# Patient Record
Sex: Male | Born: 1955
Health system: Southern US, Community
[De-identification: ages and names within clinical notes are randomized; demographics above are authoritative.]

## PROBLEM LIST (undated history)

## (undated) DIAGNOSIS — Z992 Dependence on renal dialysis: Secondary | ICD-10-CM

## (undated) DIAGNOSIS — Z7901 Long term (current) use of anticoagulants: Secondary | ICD-10-CM

## (undated) DIAGNOSIS — N189 Chronic kidney disease, unspecified: Secondary | ICD-10-CM

## (undated) DIAGNOSIS — E785 Hyperlipidemia, unspecified: Secondary | ICD-10-CM

## (undated) DIAGNOSIS — I10 Essential (primary) hypertension: Secondary | ICD-10-CM

## (undated) DIAGNOSIS — G4730 Sleep apnea, unspecified: Secondary | ICD-10-CM

## (undated) DIAGNOSIS — M109 Gout, unspecified: Secondary | ICD-10-CM

## (undated) DIAGNOSIS — D649 Anemia, unspecified: Secondary | ICD-10-CM

## (undated) HISTORY — PX: COLONOSCOPY: SHX174

---

## 2012-12-07 DIAGNOSIS — I10 Essential (primary) hypertension: Secondary | ICD-10-CM | POA: Diagnosis present

## 2012-12-10 DIAGNOSIS — N185 Chronic kidney disease, stage 5: Secondary | ICD-10-CM | POA: Insufficient documentation

## 2012-12-10 DIAGNOSIS — E1129 Type 2 diabetes mellitus with other diabetic kidney complication: Secondary | ICD-10-CM | POA: Insufficient documentation

## 2012-12-10 DIAGNOSIS — E669 Obesity, unspecified: Secondary | ICD-10-CM | POA: Insufficient documentation

## 2012-12-10 DIAGNOSIS — G4733 Obstructive sleep apnea (adult) (pediatric): Secondary | ICD-10-CM | POA: Insufficient documentation

## 2014-01-17 DIAGNOSIS — E559 Vitamin D deficiency, unspecified: Secondary | ICD-10-CM | POA: Insufficient documentation

## 2015-02-19 DIAGNOSIS — N401 Enlarged prostate with lower urinary tract symptoms: Secondary | ICD-10-CM | POA: Insufficient documentation

## 2015-08-20 DIAGNOSIS — H10409 Unspecified chronic conjunctivitis, unspecified eye: Secondary | ICD-10-CM | POA: Insufficient documentation

## 2016-05-01 DIAGNOSIS — Q8781 Alport syndrome: Secondary | ICD-10-CM | POA: Insufficient documentation

## 2017-08-14 DIAGNOSIS — M10372 Gout due to renal impairment, left ankle and foot: Secondary | ICD-10-CM | POA: Insufficient documentation

## 2017-09-21 DIAGNOSIS — R109 Unspecified abdominal pain: Secondary | ICD-10-CM | POA: Insufficient documentation

## 2019-08-07 ENCOUNTER — Encounter: Payer: Self-pay | Admitting: Internal Medicine

## 2019-08-07 ENCOUNTER — Other Ambulatory Visit: Payer: Self-pay

## 2019-08-07 ENCOUNTER — Inpatient Hospital Stay
Admission: AD | Admit: 2019-08-07 | Discharge: 2019-08-11 | DRG: 673 | Disposition: A | Discharge status: Home / Self Care | Payer: PRIVATE HEALTH INSURANCE | Source: Ambulatory Visit | Attending: Family Medicine | Admitting: Family Medicine

## 2019-08-07 ENCOUNTER — Inpatient Hospital Stay: Payer: PRIVATE HEALTH INSURANCE

## 2019-08-07 DIAGNOSIS — Z992 Dependence on renal dialysis: Secondary | ICD-10-CM

## 2019-08-07 DIAGNOSIS — Z8611 Personal history of tuberculosis: Secondary | ICD-10-CM | POA: Diagnosis not present

## 2019-08-07 DIAGNOSIS — N185 Chronic kidney disease, stage 5: Secondary | ICD-10-CM

## 2019-08-07 DIAGNOSIS — Q8781 Alport syndrome: Secondary | ICD-10-CM

## 2019-08-07 DIAGNOSIS — Z20822 Contact with and (suspected) exposure to covid-19: Secondary | ICD-10-CM | POA: Diagnosis present

## 2019-08-07 DIAGNOSIS — G4733 Obstructive sleep apnea (adult) (pediatric): Secondary | ICD-10-CM | POA: Diagnosis present

## 2019-08-07 DIAGNOSIS — Z8249 Family history of ischemic heart disease and other diseases of the circulatory system: Secondary | ICD-10-CM | POA: Diagnosis not present

## 2019-08-07 DIAGNOSIS — I10 Essential (primary) hypertension: Secondary | ICD-10-CM | POA: Diagnosis not present

## 2019-08-07 DIAGNOSIS — Z79899 Other long term (current) drug therapy: Secondary | ICD-10-CM | POA: Diagnosis not present

## 2019-08-07 DIAGNOSIS — R630 Anorexia: Secondary | ICD-10-CM | POA: Diagnosis present

## 2019-08-07 DIAGNOSIS — I120 Hypertensive chronic kidney disease with stage 5 chronic kidney disease or end stage renal disease: Principal | ICD-10-CM | POA: Diagnosis present

## 2019-08-07 DIAGNOSIS — E785 Hyperlipidemia, unspecified: Secondary | ICD-10-CM | POA: Diagnosis present

## 2019-08-07 DIAGNOSIS — Z6841 Body Mass Index (BMI) 40.0 and over, adult: Secondary | ICD-10-CM | POA: Diagnosis not present

## 2019-08-07 DIAGNOSIS — N186 End stage renal disease: Secondary | ICD-10-CM | POA: Diagnosis present

## 2019-08-07 DIAGNOSIS — Z87891 Personal history of nicotine dependence: Secondary | ICD-10-CM

## 2019-08-07 DIAGNOSIS — E1122 Type 2 diabetes mellitus with diabetic chronic kidney disease: Secondary | ICD-10-CM | POA: Diagnosis present

## 2019-08-07 DIAGNOSIS — Z9981 Dependence on supplemental oxygen: Secondary | ICD-10-CM

## 2019-08-07 DIAGNOSIS — D631 Anemia in chronic kidney disease: Secondary | ICD-10-CM

## 2019-08-07 DIAGNOSIS — R110 Nausea: Secondary | ICD-10-CM | POA: Diagnosis present

## 2019-08-07 DIAGNOSIS — E559 Vitamin D deficiency, unspecified: Secondary | ICD-10-CM | POA: Diagnosis present

## 2019-08-07 DIAGNOSIS — N2581 Secondary hyperparathyroidism of renal origin: Secondary | ICD-10-CM | POA: Diagnosis present

## 2019-08-07 DIAGNOSIS — E118 Type 2 diabetes mellitus with unspecified complications: Secondary | ICD-10-CM | POA: Diagnosis not present

## 2019-08-07 DIAGNOSIS — R0602 Shortness of breath: Secondary | ICD-10-CM

## 2019-08-07 DIAGNOSIS — R809 Proteinuria, unspecified: Secondary | ICD-10-CM | POA: Diagnosis present

## 2019-08-07 HISTORY — DX: Chronic kidney disease, unspecified: N18.9

## 2019-08-07 HISTORY — DX: Sleep apnea, unspecified: G47.30

## 2019-08-07 HISTORY — DX: Hyperlipidemia, unspecified: E78.5

## 2019-08-07 HISTORY — DX: Essential (primary) hypertension: I10

## 2019-08-07 LAB — CBC
HCT: 26.8 % — ABNORMAL LOW (ref 39.0–52.0)
Hemoglobin: 8.9 g/dL — ABNORMAL LOW (ref 13.0–17.0)
MCH: 29 pg (ref 26.0–34.0)
MCHC: 33.2 g/dL (ref 30.0–36.0)
MCV: 87.3 fL (ref 80.0–100.0)
Platelets: 269 10*3/uL (ref 150–400)
RBC: 3.07 MIL/uL — ABNORMAL LOW (ref 4.22–5.81)
RDW: 14.1 % (ref 11.5–15.5)
WBC: 9.7 10*3/uL (ref 4.0–10.5)
nRBC: 0 % (ref 0.0–0.2)

## 2019-08-07 LAB — COMPREHENSIVE METABOLIC PANEL
ALT: 14 U/L (ref 0–44)
AST: 11 U/L — ABNORMAL LOW (ref 15–41)
Albumin: 3.5 g/dL (ref 3.5–5.0)
Alkaline Phosphatase: 73 U/L (ref 38–126)
Anion gap: 13 (ref 5–15)
BUN: 122 mg/dL — ABNORMAL HIGH (ref 8–23)
CO2: 17 mmol/L — ABNORMAL LOW (ref 22–32)
Calcium: 7.5 mg/dL — ABNORMAL LOW (ref 8.9–10.3)
Chloride: 105 mmol/L (ref 98–111)
Creatinine, Ser: 9 mg/dL — ABNORMAL HIGH (ref 0.61–1.24)
GFR calc Af Amer: 7 mL/min — ABNORMAL LOW (ref >60)
GFR calc non Af Amer: 6 mL/min — ABNORMAL LOW (ref >60)
Glucose, Bld: 88 mg/dL (ref 70–99)
Potassium: 3.8 mmol/L (ref 3.5–5.1)
Sodium: 135 mmol/L (ref 135–145)
Total Bilirubin: 0.5 mg/dL (ref 0.3–1.2)
Total Protein: 6.9 g/dL (ref 6.5–8.1)

## 2019-08-07 LAB — SARS CORONAVIRUS 2 (TAT 6-24 HRS): SARS Coronavirus 2: NEGATIVE

## 2019-08-07 LAB — PHOSPHORUS: Phosphorus: 10.3 mg/dL — ABNORMAL HIGH (ref 2.5–4.6)

## 2019-08-07 LAB — MAGNESIUM: Magnesium: 2.5 mg/dL — ABNORMAL HIGH (ref 1.7–2.4)

## 2019-08-07 LAB — HIV ANTIBODY (ROUTINE TESTING W REFLEX): HIV Screen 4th Generation wRfx: NONREACTIVE

## 2019-08-07 MED ORDER — TAMSULOSIN HCL 0.4 MG PO CAPS
0.4000 mg | ORAL_CAPSULE | Freq: Every day | ORAL | Status: DC
  Administered 2019-08-09 – 2019-08-11 (×3): 0.4 mg via ORAL
  Filled 2019-08-07 (×3): qty 1

## 2019-08-07 MED ORDER — TELMISARTAN-HCTZ 80-12.5 MG PO TABS: 1.0000 | ORAL_TABLET | Freq: Every day | ORAL | Status: DC

## 2019-08-07 MED ORDER — VITAMIN B-6 50 MG PO TABS
50.0000 mg | ORAL_TABLET | Freq: Every day | ORAL | Status: DC
  Administered 2019-08-09 – 2019-08-11 (×2): 50 mg via ORAL
  Filled 2019-08-07 (×4): qty 1

## 2019-08-07 MED ORDER — ACETAMINOPHEN 650 MG RE SUPP: 650.0000 mg | Freq: Four times a day (QID) | RECTAL | Status: DC | PRN

## 2019-08-07 MED ORDER — IRBESARTAN 150 MG PO TABS
300.0000 mg | ORAL_TABLET | Freq: Every day | ORAL | Status: DC
  Administered 2019-08-09 – 2019-08-10 (×2): 300 mg via ORAL
  Filled 2019-08-07 (×3): qty 2

## 2019-08-07 MED ORDER — ROSUVASTATIN CALCIUM 10 MG PO TABS
10.0000 mg | ORAL_TABLET | Freq: Every day | ORAL | Status: DC
  Administered 2019-08-09 – 2019-08-11 (×3): 10 mg via ORAL
  Filled 2019-08-07 (×3): qty 1

## 2019-08-07 MED ORDER — ACETAMINOPHEN 325 MG PO TABS
650.0000 mg | ORAL_TABLET | Freq: Four times a day (QID) | ORAL | Status: DC | PRN
  Administered 2019-08-08 – 2019-08-09 (×2): 650 mg via ORAL
  Filled 2019-08-07 (×3): qty 2

## 2019-08-07 MED ORDER — SODIUM CHLORIDE 0.9% FLUSH
3.0000 mL | Freq: Two times a day (BID) | INTRAVENOUS | Status: DC
  Administered 2019-08-07 – 2019-08-11 (×6): 3 mL via INTRAVENOUS

## 2019-08-07 MED ORDER — SODIUM CHLORIDE 0.9 % IV SOLN
250.0000 mL | INTRAVENOUS | Status: DC | PRN
  Administered 2019-08-10: 250 mL via INTRAVENOUS

## 2019-08-07 MED ORDER — ONDANSETRON HCL 4 MG PO TABS: 4.0000 mg | ORAL_TABLET | Freq: Four times a day (QID) | ORAL | Status: DC | PRN

## 2019-08-07 MED ORDER — SODIUM CHLORIDE 0.9% FLUSH: 3.0000 mL | INTRAVENOUS | Status: DC | PRN

## 2019-08-07 MED ORDER — ALLOPURINOL 100 MG PO TABS
100.0000 mg | ORAL_TABLET | Freq: Every day | ORAL | Status: DC
  Administered 2019-08-09 – 2019-08-11 (×3): 100 mg via ORAL
  Filled 2019-08-07 (×3): qty 1

## 2019-08-07 MED ORDER — ONDANSETRON HCL 4 MG/2ML IJ SOLN: 4.0000 mg | Freq: Four times a day (QID) | INTRAMUSCULAR | Status: DC | PRN

## 2019-08-07 MED ORDER — DILTIAZEM HCL ER COATED BEADS 240 MG PO CP24
240.0000 mg | ORAL_CAPSULE | Freq: Every day | ORAL | Status: DC
  Administered 2019-08-09 – 2019-08-10 (×2): 240 mg via ORAL
  Filled 2019-08-07 (×2): qty 1
  Filled 2019-08-07: qty 2
  Filled 2019-08-07 (×2): qty 1
  Filled 2019-08-07: qty 2

## 2019-08-07 MED ORDER — HYDROCHLOROTHIAZIDE 12.5 MG PO CAPS: 12.5000 mg | ORAL_CAPSULE | Freq: Every day | ORAL | Status: DC

## 2019-08-07 NOTE — H&P (Signed)
History and Physical    Peter Pearson OIZ:[id number] DOB: [birth year] DOA: 08/07/2019  PCP: System, Pcp Not In   Patient coming from: Home  I have personally briefly reviewed patient's old medical records in [place]  Chief Complaint: Shortness of breath  HPI: Peter Pearson is a 64 y.o. 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  Previously patient was followed by [facility] nephrology but requested second opinion.  Patient has known severe renal insufficiency.  Most recent BUN is 122 with a creatinine of 9 and EGFR of 6.  Serum bicarbonate level is 17. Serum phosphorus was also quite high at 9.1.  He also has significant proteinuria with most recent albumin to creatinine ratio of 4065.  He also has evidence of anemia chronic kidney disease with most recent hemoglobin of 8.3.  In addition patient has additional complication in the form of secondary hyperparathyroidism with most recent PTH of 776.  He was previously advised to pursue renal placement therapy but wanted to consider this a bit further.   Patient complains of exertional shortness of breath associated with lower extremity swelling, nausea and anorexia.  Denies having any chest pain, dizziness, light headedness, cough, fever or chills He has now agreed to undergo hemodialysis.  Plan is for patient to get PermCath placed in a.m. prior to initiation of dialysis.   ED Course: N/A  Review of Systems: As per HPI otherwise 10 point review of systems negative.    Past Medical History:  Diagnosis Date   . Chronic kidney disease   . Hyperlipidemia   . Hypertension   . Sleep apnea     History reviewed. No pertinent surgical history.   reports that he quit smoking about 30 years ago. He has never used smokeless tobacco. He reports that he does not drink alcohol or use drugs.  No Known Allergies  Family History  Problem Relation Age of Onset  . Heart disease Father      Prior to Admission medications   Medication Sig Start Date End Date Taking? Authorizing Provider  allopurinol (ZYLOPRIM) 100 MG tablet Take 100 mg by mouth daily.   Yes [provider]  diltiazem (DILACOR XR) 240 MG 24 hr capsule Take 240 mg by mouth daily.   Yes [provider]  pyridOXINE (VITAMIN B-6) 50 MG tablet Take 50 mg by mouth daily.   Yes [provider]  rosuvastatin (CRESTOR) 20 MG tablet Take 10 mg by mouth daily.   Yes [provider]  tamsulosin (FLOMAX) 0.4 MG CAPS capsule Take 0.4 mg by mouth daily.   Yes [provider]  telmisartan-hydrochlorothiazide (MICARDIS HCT) 80-12.5 MG tablet Take 1 tablet by mouth daily.   Yes [provider]    Physical Exam: Vitals:   08/07/19 1100 08/07/19 1316  BP:  127/63  Pulse:  67  Temp:  (!) 97.4 F (36.3 C)  TempSrc:  Oral  SpO2:  100%  Weight: 125.2 kg   Height: 5' 8" (1.727 m)      Vitals:   08/07/19 1100 08/07/19 1316  BP:  127/63  Pulse:  67  Temp:  (!) 97.4 F (36.3  C)  TempSrc:  Oral  SpO2:  100%  Weight: 125.2 kg   Height: 5' 8" (1.727 m)     Constitutional: NAD, alert and oriented to person place and time Eyes: PERRL, lids and conjunctivae pale ENMT: Mucous membranes are moist.  Hearing loss Neck: normal, supple, no masses, no thyromegaly Respiratory: Air movement in all lung fields, no wheezing, no crackles. Normal respiratory effort. No accessory muscle use.  Cardiovascular: Regular rate and rhythm, no murmurs / rubs / gallops. No extremity edema. 2+ pedal pulses. No carotid  bruits.  Abdomen: no tenderness, no masses palpated. No hepatosplenomegaly. Bowel sounds positive.  Musculoskeletal: no clubbing / cyanosis. No joint deformity upper and lower extremities.  Skin: no rashes, lesions, ulcers.  Neurologic: No gross focal neurologic deficit. Psychiatric: Normal mood and affect.   Labs on Admission: I have personally reviewed following labs and imaging studies  CBC: Recent Labs  Lab 08/07/19 1240  WBC 9.7  HGB 8.9*  HCT 26.8*  MCV 87.3  PLT 016   Basic Metabolic Panel: No results for input(s): NA, K, CL, CO2, GLUCOSE, BUN, CREATININE, CALCIUM, MG, PHOS in the last 168 hours. GFR: CrCl cannot be calculated (No successful lab value found.). Liver Function Tests: No results for input(s): AST, ALT, ALKPHOS, BILITOT, PROT, ALBUMIN in the last 168 hours. No results for input(s): LIPASE, AMYLASE in the last 168 hours. No results for input(s): AMMONIA in the last 168 hours. Coagulation Profile: No results for input(s): INR, PROTIME in the last 168 hours. Cardiac Enzymes: No results for input(s): CKTOTAL, CKMB, CKMBINDEX, TROPONINI in the last 168 hours. BNP (last 3 results) No results for input(s): PROBNP in the last 8760 hours. HbA1C: No results for input(s): HGBA1C in the last 72 hours. CBG: No results for input(s): GLUCAP in the last 168 hours. Lipid Profile: No results for input(s): CHOL, HDL, LDLCALC, TRIG, CHOLHDL, LDLDIRECT in the last 72 hours. Thyroid Function Tests: No results for input(s): TSH, T4TOTAL, FREET4, T3FREE, THYROIDAB in the last 72 hours. Anemia Panel: No results for input(s): VITAMINB12, FOLATE, FERRITIN, TIBC, IRON, RETICCTPCT in the last 72 hours. Urine analysis: No results found for: COLORURINE, APPEARANCEUR, LABSPEC, PHURINE, GLUCOSEU, HGBUR, BILIRUBINUR, KETONESUR, PROTEINUR, UROBILINOGEN, NITRITE, LEUKOCYTESUR  Radiological Exams on Admission: No results found.  EKG: Independently reviewed. Sinus  rhythm  Assessment/Plan Active Problems:   ESRD (end stage renal disease) (HCC)   Essential hypertension   Anemia of chronic kidney failure, stage 5 (HCC)    Stage V chronic kidney disease Patient with known stage V chronic kidney disease with worsening of his renal function BUN/Cr 122/9  admitted to the hospital for initiation of renal replacement therapy due to uremic symptoms PermCath insertion is planned for a.m. Nephrology consult   Hypertension Blood pressure is controlled on diltiazem, HCTZ and ARB   Anemia of chronic kidney disease H&H is stable Monitor closely    DVT prophylaxis: SCD Code Status: Full Family Communication: Plan of care was discussed with patient in detail.  Verbalizes understanding and agrees with the plan Disposition Plan: Back to previous home environment Consults called: Nephrology    Collier Bullock MD [facility]     08/07/2019, 1:59 PM

## 2019-08-07 NOTE — Consult Note (Signed)
[facility] CONSULT NOTE    Date: 08/07/2019                  Patient Name:  Peter Pearson  MRN: [id number]  DOB: 1956-01-24  Age / Sex: 64 y.o., male         PCP: System, Pcp Not In                 Service Requesting Consult: Hospitalist                 Reason for Consult: Evaluation and management of ESRD in setting of Alports Disease            History of Present Illness: Patient is a 64 y.o. 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  Previously patient was followed by [facility] nephrology but requested second opinion.  Patient has known severe renal insufficiency.  Most recent BUN was 86 with a creatinine of 8.3 and EGFR of 6.  Serum phosphorus was also quite high at 9.1.  He also has significant proteinuria with most recent albumin to creatinine ratio of 4065.  He also has evidence of anemia chronic kidney disease with most recent hemoglobin of 9.3.  In addition patient has additional complication in the form of secondary hyperparathyroidism with most recent PTH of 776.  He was previously advised to pursue renal placement therapy but wanted to consider this a bit further.  He has now agreed to undergo hemodialysis.  Covid test will need to be drawn and case has been discussed with vascular surgery and they were planning to perform PermCath placement tomorrow.   Medications: Outpatient medications: Medications Prior to Admission  Medication Sig Dispense Refill Last Dose  . allopurinol (ZYLOPRIM) 100 MG tablet Take 100 mg by mouth daily.   08/07/2019  . diltiazem  (DILACOR XR) 240 MG 24 hr capsule Take 240 mg by mouth daily.   08/07/2019 at Unknown time  . pyridOXINE (VITAMIN B-6) 50 MG tablet Take 50 mg by mouth daily.   08/07/2019  . rosuvastatin (CRESTOR) 20 MG tablet Take 10 mg by mouth daily.   08/07/2019 at Unknown time  . tamsulosin (FLOMAX) 0.4 MG CAPS capsule Take 0.4 mg by mouth daily.   08/07/2019 at Unknown time  . telmisartan-hydrochlorothiazide (MICARDIS HCT) 80-12.5 MG tablet Take 1 tablet by mouth daily.   08/07/2019 at Unknown time    Current medications: Current Facility-Administered Medications  Medication Dose Route Frequency Provider Last Rate Last Admin  . 0.9 %  sodium chloride infusion  250 mL Intravenous PRN Agbata, Tochukwu, MD      . acetaminophen (TYLENOL) tablet 650 mg  650 mg Oral Q6H PRN Agbata, Tochukwu, MD       Or  . acetaminophen (TYLENOL) suppository 650 mg  650 mg Rectal Q6H PRN Agbata, Tochukwu, MD      . Derrill Memo ON 08/08/2019] allopurinol (ZYLOPRIM) tablet 100 mg  100 mg Oral Daily Agbata, Tochukwu, MD      . Derrill Memo ON 08/08/2019] diltiazem (CARDIZEM CD) 24 hr capsule 240 mg  240 mg Oral Daily Agbata, Tochukwu, MD      . Derrill Memo ON  08/08/2019] irbesartan (AVAPRO) tablet 300 mg  300 mg Oral Daily Agbata, Tochukwu, MD       And  . Derrill Memo ON 08/08/2019] hydrochlorothiazide (MICROZIDE) capsule 12.5 mg  12.5 mg Oral Daily Agbata, Tochukwu, MD      . ondansetron (ZOFRAN) tablet 4 mg  4 mg Oral Q6H PRN Agbata, Tochukwu, MD       Or  . ondansetron (ZOFRAN) injection 4 mg  4 mg Intravenous Q6H PRN Agbata, Tochukwu, MD      . Derrill Memo ON 08/08/2019] pyridOXINE (VITAMIN B-6) tablet 50 mg  50 mg Oral Daily Agbata, Tochukwu, MD      . Derrill Memo ON 08/08/2019] rosuvastatin (CRESTOR) tablet 10 mg  10 mg Oral Daily Agbata, Tochukwu, MD      . sodium chloride flush (NS) 0.9 % injection 3 mL  3 mL Intravenous Q12H Agbata, Tochukwu, MD      . sodium chloride flush (NS) 0.9 % injection 3 mL  3 mL Intravenous PRN Agbata, Tochukwu, MD      . Derrill Memo ON 08/08/2019]  tamsulosin (FLOMAX) capsule 0.4 mg  0.4 mg Oral Daily Agbata, Tochukwu, MD          Allergies: No Known Allergies    Past Medical History: Past Medical History:  Diagnosis Date  . Chronic kidney disease   . Hyperlipidemia   . Hypertension   . Sleep apnea      Past Surgical History: History reviewed. No pertinent surgical history.   Family History: Family History  Problem Relation Age of Onset  . Heart disease Father      Social History: Social History   Socioeconomic History  . Marital status: Married    Spouse name: Shamim  . Number of children: 3  . Years of education: Not on file  . Highest education level: Not on file  Occupational History  . Not on file  Tobacco Use  . Smoking status: Former Smoker    Quit date: 08/06/1989    Years since quitting: 30.0  . Smokeless tobacco: Never Used  Substance and Sexual Activity  . Alcohol use: Never  . Drug use: Never  . Sexual activity: Not on file  Other Topics Concern  . Not on file  Social History Narrative  . Not on file   Social Determinants of Health   Financial Resource Strain:   . Difficulty of Paying Living Expenses: Not on file  Food Insecurity:   . Worried About Charity fundraiser in the Last Year: Not on file  . Ran Out of Food in the Last Year: Not on file  Transportation Needs:   . Lack of Transportation (Medical): Not on file  . Lack of Transportation (Non-Medical): Not on file  Physical Activity:   . Days of Exercise per Week: Not on file  . Minutes of Exercise per Session: Not on file  Stress:   . Feeling of Stress : Not on file  Social Connections:   . Frequency of Communication with Friends and Family: Not on file  . Frequency of Social Gatherings with Friends and Family: Not on file  . Attends Religious Services: Not on file  . Active Member of Clubs or Organizations: Not on file  . Attends Archivist Meetings: Not on file  . Marital Status: Not on file  Intimate  Partner Violence:   . Fear of Current or Ex-Partner: Not on file  . Emotionally Abused: Not on file  . Physically Abused: Not on file  .  Sexually Abused: Not on file     Review of Systems: Review of Systems  Constitutional: Positive for malaise/fatigue. Negative for chills and fever.  HENT: Positive for hearing loss. Negative for congestion and tinnitus.   Eyes: Negative for blurred vision and double vision.  Respiratory: Positive for shortness of breath. Negative for cough and sputum production.   Cardiovascular: Positive for leg swelling. Negative for chest pain, palpitations and orthopnea.  Gastrointestinal: Negative for diarrhea, nausea and vomiting.  Genitourinary: Negative for dysuria, frequency and urgency.  Musculoskeletal: Negative for myalgias.  Skin: Negative for itching and rash.  Neurological: Negative for dizziness and focal weakness.  Endo/Heme/Allergies: Negative for polydipsia. Does not bruise/bleed easily.  Psychiatric/Behavioral: Negative for depression. The patient is nervous/anxious.      Vital Signs: Height 5' 8"  (1.727 m), weight 125.2 kg.  Weight trends: Filed Weights   08/07/19 1100  Weight: 125.2 kg    Physical Exam: General: NAD, sitting up in bed  Head: Normocephalic, atraumatic.  Eyes: Anicteric, EOMI  Nose: Mucous membranes moist, not inflammed, nonerythematous.  Throat: Oropharynx nonerythematous, no exudate appreciated.   Neck: Supple, trachea midline.  Lungs:  Basilar rales, normal effort  Heart: RRR. S1 and S2 normal without gallop, murmur, or rubs.  Abdomen:  BS normoactive. Soft, Nondistended, non-tender.  No masses or organomegaly.  Extremities: 1+ pretibial edema.  Neurologic: A&O X3, Motor strength is 5/5 in the all 4 extremities  Skin: No visible rashes, scars.    Lab results: Basic Metabolic Panel: No results for input(s): NA, K, CL, CO2, GLUCOSE, BUN, CREATININE, CALCIUM, MG, PHOS in the last 168 hours.  Liver Function  Tests: No results for input(s): AST, ALT, ALKPHOS, BILITOT, PROT, ALBUMIN in the last 168 hours. No results for input(s): LIPASE, AMYLASE in the last 168 hours. No results for input(s): AMMONIA in the last 168 hours.  CBC: No results for input(s): WBC, NEUTROABS, HGB, HCT, MCV, PLT in the last 168 hours.  Cardiac Enzymes: No results for input(s): CKTOTAL, CKMB, CKMBINDEX, TROPONINI in the last 168 hours.  BNP: Invalid input(s): POCBNP  CBG: No results for input(s): GLUCAP in the last 168 hours.  Microbiology: No results found for this or any previous visit.  Coagulation Studies: No results for input(s): LABPROT, INR in the last 72 hours.  Urinalysis: No results for input(s): COLORURINE, LABSPEC, PHURINE, GLUCOSEU, HGBUR, BILIRUBINUR, KETONESUR, PROTEINUR, UROBILINOGEN, NITRITE, LEUKOCYTESUR in the last 72 hours.  Invalid input(s): APPERANCEUR    Imaging:  No results found.   Assessment & Plan: Pt is a 64 y.o. 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  1.  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  Patient does have hearing loss as well as renal failure.  Prior kidney biopsy performed in 2001.  Patient clearly has signs of ESRD now with significant shortness of breath with exertion, weight gain, and evidence of anemia of chronic kidney disease as well as secondary hyperparathyroidism.  He requested second opinion from [place].  We have recommended that he initiate hemodialysis.  He will need Covid testing first.  Case has been discussed  with vascular surgery and they do plan to  perform PermCath placement tomorrow.  We will begin his first dialysis treatment tomorrow as well.  He is a potential transplant candidate and several of his children have come forward.  However they would likely need to be tested to make sure they do not have underlying hereditary nephritis as well.  2.  Hypertension.  Maintain the patient on diltiazem and irbesartan at this time.  3.  Anemia of chronic kidney disease.  Reevaluate CBC now and consider starting the patient on Epogen based upon hemoglobin.  4.  Secondary hyperparathyroidism.  We plan to more fully evaluate his bone mineral metabolism parameters.  Previous phosphorus was quite high and he will likely need initiation of binder therapy.

## 2019-08-08 ENCOUNTER — Encounter
Admission: AD | Disposition: A | Discharge status: Home / Self Care | Payer: Self-pay | Source: Ambulatory Visit | Attending: Internal Medicine

## 2019-08-08 ENCOUNTER — Other Ambulatory Visit (INDEPENDENT_AMBULATORY_CARE_PROVIDER_SITE_OTHER): Payer: Self-pay | Admitting: Vascular Surgery

## 2019-08-08 ENCOUNTER — Encounter: Payer: Self-pay | Admitting: Cardiology

## 2019-08-08 DIAGNOSIS — N185 Chronic kidney disease, stage 5: Secondary | ICD-10-CM

## 2019-08-08 DIAGNOSIS — R0602 Shortness of breath: Secondary | ICD-10-CM

## 2019-08-08 DIAGNOSIS — N186 End stage renal disease: Secondary | ICD-10-CM

## 2019-08-08 DIAGNOSIS — E118 Type 2 diabetes mellitus with unspecified complications: Secondary | ICD-10-CM

## 2019-08-08 DIAGNOSIS — Z992 Dependence on renal dialysis: Secondary | ICD-10-CM

## 2019-08-08 HISTORY — PX: DIALYSIS/PERMA CATHETER INSERTION: CATH118288

## 2019-08-08 LAB — BASIC METABOLIC PANEL
Anion gap: 14 (ref 5–15)
BUN: 134 mg/dL — ABNORMAL HIGH (ref 8–23)
CO2: 14 mmol/L — ABNORMAL LOW (ref 22–32)
Calcium: 7.2 mg/dL — ABNORMAL LOW (ref 8.9–10.3)
Chloride: 110 mmol/L (ref 98–111)
Creatinine, Ser: 9.79 mg/dL — ABNORMAL HIGH (ref 0.61–1.24)
GFR calc Af Amer: 6 mL/min — ABNORMAL LOW (ref >60)
GFR calc non Af Amer: 5 mL/min — ABNORMAL LOW (ref >60)
Glucose, Bld: 114 mg/dL — ABNORMAL HIGH (ref 70–99)
Potassium: 3.9 mmol/L (ref 3.5–5.1)
Sodium: 138 mmol/L (ref 135–145)

## 2019-08-08 LAB — CBC
HCT: 26.4 % — ABNORMAL LOW (ref 39.0–52.0)
Hemoglobin: 8.6 g/dL — ABNORMAL LOW (ref 13.0–17.0)
MCH: 28.7 pg (ref 26.0–34.0)
MCHC: 32.6 g/dL (ref 30.0–36.0)
MCV: 88 fL (ref 80.0–100.0)
Platelets: 273 10*3/uL (ref 150–400)
RBC: 3 MIL/uL — ABNORMAL LOW (ref 4.22–5.81)
RDW: 14.3 % (ref 11.5–15.5)
WBC: 9.6 10*3/uL (ref 4.0–10.5)
nRBC: 0 % (ref 0.0–0.2)

## 2019-08-08 LAB — PHOSPHORUS: Phosphorus: 8.1 mg/dL — ABNORMAL HIGH (ref 2.5–4.6)

## 2019-08-08 LAB — HEPATITIS B CORE ANTIBODY, IGM: Hep B C IgM: NONREACTIVE

## 2019-08-08 LAB — PROTIME-INR
INR: 1.2 (ref 0.8–1.2)
Prothrombin Time: 14.7 seconds (ref 11.4–15.2)

## 2019-08-08 LAB — HEPATITIS B SURFACE ANTIGEN: Hepatitis B Surface Ag: NONREACTIVE

## 2019-08-08 SURGERY — DIALYSIS/PERMA CATHETER INSERTION
Anesthesia: Moderate Sedation

## 2019-08-08 MED ORDER — SODIUM CHLORIDE 0.9 % IV SOLN: INTRAVENOUS | Status: DC

## 2019-08-08 MED ORDER — HYDROMORPHONE HCL 1 MG/ML IJ SOLN: 1.0000 mg | Freq: Once | INTRAMUSCULAR | Status: DC | PRN

## 2019-08-08 MED ORDER — EPOETIN ALFA 10000 UNIT/ML IJ SOLN
4000.0000 [IU] | INTRAMUSCULAR | Status: DC
  Administered 2019-08-09 – 2019-08-11 (×3): 4000 [IU] via INTRAVENOUS
  Filled 2019-08-08: qty 1

## 2019-08-08 MED ORDER — DIPHENHYDRAMINE HCL 50 MG/ML IJ SOLN: 50.0000 mg | Freq: Once | INTRAMUSCULAR | Status: DC | PRN

## 2019-08-08 MED ORDER — PENTAFLUOROPROP-TETRAFLUOROETH EX AERO
1.0000 "application " | INHALATION_SPRAY | CUTANEOUS | Status: DC | PRN
  Filled 2019-08-08: qty 30

## 2019-08-08 MED ORDER — FENTANYL CITRATE (PF) 100 MCG/2ML IJ SOLN
INTRAMUSCULAR | Status: AC
  Filled 2019-08-08: qty 2

## 2019-08-08 MED ORDER — HEPARIN SODIUM (PORCINE) 1000 UNIT/ML DIALYSIS
1000.0000 [IU] | INTRAMUSCULAR | Status: DC | PRN
  Filled 2019-08-08: qty 1

## 2019-08-08 MED ORDER — CEFAZOLIN SODIUM-DEXTROSE 1-4 GM/50ML-% IV SOLN
1.0000 g | Freq: Once | INTRAVENOUS | Status: AC
  Administered 2019-08-08: 1 g via INTRAVENOUS
  Filled 2019-08-08: qty 50

## 2019-08-08 MED ORDER — CEFAZOLIN SODIUM-DEXTROSE 1-4 GM/50ML-% IV SOLN: 1.0000 g | Freq: Once | INTRAVENOUS | Status: DC

## 2019-08-08 MED ORDER — CHLORHEXIDINE GLUCONATE CLOTH 2 % EX PADS
6.0000 | MEDICATED_PAD | Freq: Every day | CUTANEOUS | Status: DC
  Administered 2019-08-09 – 2019-08-10 (×2): 6 via TOPICAL

## 2019-08-08 MED ORDER — METHYLPREDNISOLONE SODIUM SUCC 125 MG IJ SOLR: 125.0000 mg | Freq: Once | INTRAMUSCULAR | Status: DC | PRN

## 2019-08-08 MED ORDER — FENTANYL CITRATE (PF) 100 MCG/2ML IJ SOLN
INTRAMUSCULAR | Status: DC | PRN
  Administered 2019-08-08: 25 ug via INTRAVENOUS
  Administered 2019-08-08: 50 ug via INTRAVENOUS

## 2019-08-08 MED ORDER — LIDOCAINE HCL (PF) 1 % IJ SOLN
5.0000 mL | INTRAMUSCULAR | Status: DC | PRN
  Filled 2019-08-08: qty 5

## 2019-08-08 MED ORDER — ALTEPLASE 2 MG IJ SOLR: 2.0000 mg | Freq: Once | INTRAMUSCULAR | Status: DC | PRN

## 2019-08-08 MED ORDER — MIDAZOLAM HCL 2 MG/2ML IJ SOLN
INTRAMUSCULAR | Status: DC | PRN
  Administered 2019-08-08: 2 mg via INTRAVENOUS
  Administered 2019-08-08: 1 mg via INTRAVENOUS

## 2019-08-08 MED ORDER — ONDANSETRON HCL 4 MG/2ML IJ SOLN
4.0000 mg | Freq: Four times a day (QID) | INTRAMUSCULAR | Status: DC | PRN
  Administered 2019-08-10: 4 mg via INTRAVENOUS

## 2019-08-08 MED ORDER — SODIUM CHLORIDE 0.9 % IV SOLN: 100.0000 mL | INTRAVENOUS | Status: DC | PRN

## 2019-08-08 MED ORDER — FAMOTIDINE 20 MG PO TABS: 40.0000 mg | ORAL_TABLET | Freq: Once | ORAL | Status: DC | PRN

## 2019-08-08 MED ORDER — MIDAZOLAM HCL 5 MG/5ML IJ SOLN
INTRAMUSCULAR | Status: AC
  Filled 2019-08-08: qty 5

## 2019-08-08 MED ORDER — MIDAZOLAM HCL 2 MG/ML PO SYRP: 8.0000 mg | ORAL_SOLUTION | Freq: Once | ORAL | Status: DC | PRN

## 2019-08-08 MED ORDER — LIDOCAINE-PRILOCAINE 2.5-2.5 % EX CREA: 1.0000 "application " | TOPICAL_CREAM | CUTANEOUS | Status: DC | PRN

## 2019-08-08 SURGICAL SUPPLY — 10 items
BIOPATCH RED 1 DISK 7.0 (GAUZE/BANDAGES/DRESSINGS) ×1 IMPLANT
CATH PALINDROME RT-P 15FX23CM (CATHETERS) ×1 IMPLANT
DERMABOND ADVANCED (GAUZE/BANDAGES/DRESSINGS) ×1
DERMABOND ADVANCED .7 DNX12 (GAUZE/BANDAGES/DRESSINGS) IMPLANT
PACK ANGIOGRAPHY (CUSTOM PROCEDURE TRAY) ×1 IMPLANT
SUT MNCRL 4-0 (SUTURE) ×1
SUT MNCRL 4-0 27XMFL (SUTURE) ×1
SUT PROLENE 0 CT 1 30 (SUTURE) ×1 IMPLANT
SUTURE MNCRL 4-0 27XMF (SUTURE) IMPLANT
TOWEL OR 17X26 4PK STRL BLUE (TOWEL DISPOSABLE) ×1 IMPLANT

## 2019-08-08 NOTE — Progress Notes (Signed)
Following patient for hemodialysis outpatient placement. Will be sending referral to [facility]. Still waiting on Hep B (Antigen, Antibody, Total Core) to result. Patient education attempted however patient was drowsy. Will meet with again later this week.

## 2019-08-08 NOTE — Op Note (Signed)
OPERATIVE NOTE    PRE-OPERATIVE DIAGNOSIS: 1. ESRD   POST-OPERATIVE DIAGNOSIS: same as above  PROCEDURE: 1. Ultrasound guidance for vascular access to the right internal jugular vein 2. Fluoroscopic guidance for placement of catheter 3. Placement of a 23 cm tip to cuff tunneled hemodialysis catheter via the right internal jugular vein  SURGEON: Leotis Pain, MD  ANESTHESIA:  Local with Moderate conscious sedation for approximately 20 minutes using 3 mg of Versed and 75 mcg of Fentanyl  ESTIMATED BLOOD LOSS: 5 cc  FLUORO TIME: less than one minute  CONTRAST: none  FINDING(S): 1.  Patent right internal jugular vein  SPECIMEN(S):  None  INDICATIONS:   Peter Pearson is a 64 y.o.male who presents with renal failure.  The patient needs long term dialysis access for their ESRD, and a Permcath is necessary.  Risks and benefits are discussed and informed consent is obtained.    DESCRIPTION: After obtaining full informed written consent, the patient was brought back to the vascular suited. The patient's right neck and chest were sterilely prepped and draped in a sterile surgical field was created. Moderate conscious sedation was administered during a face to face encounter with the patient throughout the procedure with my supervision of the RN administering medicines and monitoring the patient's vital signs, pulse oximetry, telemetry and mental status throughout from the start of the procedure until the patient was taken to the recovery room.  The right internal jugular vein was visualized with ultrasound and found to be patent. It was then accessed under direct ultrasound guidance and a permanent image was recorded. A wire was placed. After skin nick and dilatation, the peel-away sheath was placed over the wire. I then turned my attention to an area under the clavicle. Approximately 1-2 fingerbreadths below the clavicle a small counterincision was created and tunneled from the subclavicular  incision to the access site. Using fluoroscopic guidance, a 23 centimeter tip to cuff tunneled hemodialysis catheter was selected, and tunneled from the subclavicular incision to the access site. It was then placed through the peel-away sheath and the peel-away sheath was removed. Using fluoroscopic guidance the catheter tips were parked in the right atrium. The appropriate distal connectors were placed. It withdrew blood well and flushed easily with heparinized saline and a concentrated heparin solution was then placed. It was secured to the chest wall with 2 Prolene sutures. The access incision was closed single 4-0 Monocryl. A 4-0 Monocryl pursestring suture was placed around the exit site. Sterile dressings were placed. The patient tolerated the procedure well and was taken to the recovery room in stable condition.  COMPLICATIONS: None  CONDITION: Stable  Leotis Pain, MD 08/08/2019 10:10 AM   This note was created with Dragon Medical transcription system. Any errors in dictation are purely unintentional.

## 2019-08-08 NOTE — Progress Notes (Signed)
[facility]  ROUNDING NOTE   Subjective:  Patient underwent successful right IJ PermCath placement today. Tolerated well. Due for first dialysis treatment today. Appears to be in better spirits today.  Objective:  Vital signs in last 24 hours:  Temp:  [98.1 F (36.7 C)-98.7 F (37.1 C)] 98.6 F (37 C) (03/08 1112) Pulse Rate:  [58-80] 74 (03/08 1230) Resp:  [12-21] 21 (03/08 1230) BP: (101-137)/(50-86) 133/73 (03/08 1230) SpO2:  [96 %-100 %] 100 % (03/08 1230) Weight:  [125.2 kg] 125.2 kg (03/08 0934)  Weight change:  Filed Weights   08/07/19 1100 08/08/19 0934  Weight: 125.2 kg 125.2 kg    Intake/Output: No intake/output data recorded.   Intake/Output this shift:  No intake/output data recorded.  Physical Exam: General: No acute distress  Head: Normocephalic, atraumatic. Moist oral mucosal membranes  Eyes: Anicteric  Neck: Supple, trachea midline  Lungs:  Clear to auscultation, normal effort  Heart: S1S2 no rubs  Abdomen:  Soft, nontender, bowel sounds present  Extremities: 1+ peripheral edema.  Neurologic: Awake, alert, following commands  Skin: No lesions  Access: Right internal jugular PermCath in place    Basic Metabolic Panel: Recent Labs  Lab 08/07/19 1240 08/08/19 0619  NA 135 138  K 3.8 3.9  CL 105 110  CO2 17* 14*  GLUCOSE 88 114*  BUN 122* 134*  CREATININE 9.00* 9.79*  CALCIUM 7.5* 7.2*  MG 2.5*  --   PHOS 10.3*  --     Liver Function Tests: Recent Labs  Lab 08/07/19 1240  AST 11*  ALT 14  ALKPHOS 73  BILITOT 0.5  PROT 6.9  ALBUMIN 3.5   No results for input(s): LIPASE, AMYLASE in the last 168 hours. No results for input(s): AMMONIA in the last 168 hours.  CBC: Recent Labs  Lab 08/07/19 1240 08/08/19 0619  WBC 9.7 9.6  HGB 8.9* 8.6*  HCT 26.8* 26.4*  MCV 87.3 88.0  PLT 269 273    Cardiac Enzymes: No results for input(s): CKTOTAL, CKMB, CKMBINDEX, TROPONINI in the last 168 hours.  BNP: Invalid  input(s): POCBNP  CBG: No results for input(s): GLUCAP in the last 168 hours.  Microbiology: Results for orders placed or performed during the hospital encounter of 08/07/19  SARS CORONAVIRUS 2 (TAT 6-24 HRS) Nasopharyngeal Nasopharyngeal Swab     Status: None   Collection Time: 08/07/19  3:37 PM   Specimen: Nasopharyngeal Swab  Result Value Ref Range Status   SARS Coronavirus 2 NEGATIVE NEGATIVE Final    Comment: (NOTE) SARS-CoV-2 target nucleic acids are NOT DETECTED. The SARS-CoV-2 RNA is generally detectable in upper and lower respiratory specimens during the acute phase of infection. Negative results do not preclude SARS-CoV-2 infection, do not rule out co-infections with other pathogens, and should not be used as the sole basis for treatment or other patient management decisions. Negative results must be combined with clinical observations, patient history, and epidemiological information. The expected result is Negative. Fact Sheet for Patients: SugarRoll.be Fact Sheet for Healthcare Providers: https://www.woods-mathews.com/ This test is not yet approved or cleared by the [place] FDA and  has been authorized for detection and/or diagnosis of SARS-CoV-2 by FDA under an Emergency Use Authorization (EUA). This EUA will remain  in effect (meaning this test can be used) for the duration of the COVID-19 declaration under Section 56 4(b)(1) of the Act, 21 U.S.C. section 360bbb-3(b)(1), unless the authorization is terminated or revoked sooner. Performed at [facility], [place]  [address], [place], [place] [address]     Coagulation Studies: Recent Labs    08/08/19 0619  LABPROT 14.7  INR 1.2    Urinalysis: No results for input(s): COLORURINE, LABSPEC, PHURINE, GLUCOSEU, HGBUR, BILIRUBINUR, KETONESUR, PROTEINUR, UROBILINOGEN, NITRITE, LEUKOCYTESUR in the last 72 hours.  Invalid input(s): APPERANCEUR     Imaging: PERIPHERAL VASCULAR CATHETERIZATION  Result Date: 08/08/2019 See op note  DG Chest Port 1 View  Result Date: 08/07/2019 CLINICAL DATA:  Shortness of breath for a month, denies chest pain and cough EXAM: PORTABLE CHEST 1 VIEW COMPARISON:  Radiograph 06/08/2018, 10/12/2000 FINDINGS: Slightly bulbous configuration of the right main pulmonary artery, could reflect some right hilar adenopathy or pulmonary arterial congestion. No consolidative opacity is seen. Lung volumes are low with hazy interstitial changes and cephalized vascular markings. Cardiac silhouette is upper limits normal. No acute osseous or soft tissue abnormality. Telemetry leads overlie the chest. IMPRESSION: Features of central vascular congestion. Convex appearance of the right main pulmonary artery/hilum, nonspecific and possibly artifactual but could reflect hilar adenopathy, consider CT imaging. Electronically Signed   By: Lovena Le M.D.   On: 08/07/2019 15:16     Medications:   . sodium chloride    . sodium chloride    . sodium chloride     . allopurinol  100 mg Oral Daily  . Chlorhexidine Gluconate Cloth  6 each Topical Q0600  . diltiazem  240 mg Oral Daily  . fentaNYL      . irbesartan  300 mg Oral Daily   And  . hydrochlorothiazide  12.5 mg Oral Daily  . midazolam      . pyridOXINE  50 mg Oral Daily  . rosuvastatin  10 mg Oral Daily  . sodium chloride flush  3 mL Intravenous Q12H  . tamsulosin  0.4 mg Oral Daily   sodium chloride, sodium chloride, sodium chloride, acetaminophen **OR** acetaminophen, alteplase, heparin, HYDROmorphone (DILAUDID) injection, lidocaine (PF), lidocaine-prilocaine, ondansetron (ZOFRAN) IV, ondansetron **OR** [DISCONTINUED] ondansetron (ZOFRAN) IV, pentafluoroprop-tetrafluoroeth, sodium chloride flush  Assessment/ Plan:  64 y.o. 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  1.  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    Patient successfully underwent PermCath placement today.  Appreciate vascular surgery assistance.  Patient to undergo first dialysis treatment today.  We will plan for second Alysis treatment tomorrow.  Outpatient hemodialysis center placement pending and discussed with dialysis liaison.  In addition we have requested vascular surgery to assess the patient for fistula placement.  They are tentatively planning for this on Wednesday.  2.  Hypertension.  Continue diltiazem and irbesartan.  Discontinue HCTZ.  3.  Anemia of chronic kidney disease.  We will likely start the patient on Epogen tomorrow.  4.  Secondary hyperparathyroidism.  Phosphorus quite high at 10.3.  This should begin coming down with ongoing dialysis treatments.   LOS: 1 Dorleen Kissel 3/8/20212:05 PM

## 2019-08-08 NOTE — Progress Notes (Signed)
PROGRESS NOTE  Rob Mciver LYY:[id number] DOB: 12/02/1955 DOA: 08/07/2019 PCP: System, Pcp Not In  Brief History   Orestes Geiman is a 64 y.o. 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 Previously patient was followed by [facility] nephrology but requested second opinion. Patient has known severe renal insufficiency. Most recent BUN is 122 with a creatinine of 9 and EGFR of 6.  Serum bicarbonate level is 17.Serum phosphorus was also quite high at 9.1. He also has significant proteinuria with most recent albumin to creatinine ratio of 4065. He also has evidence of anemia chronic kidney disease with most recent hemoglobin of 8.3. In addition patient has additional complication in the form of secondary hyperparathyroidism with most recent PTH of 776. He was previously advised to pursue renal placement therapy but wanted to consider this a bit further.  Patient complains of exertional shortness of breath associated with lower extremity swelling, nausea and anorexia.  Denies having any chest pain, dizziness, light headedness, cough, fever or chills He has now agreed to undergo hemodialysis.  Plan is for patient to get PermCath placed in a.m. prior to initiation of dialysis.  The patient has had Permcath placed this morning and has undergone dialysis. Vascular surgery has been consulted for fistula placement. This has been planned for Wednesday. The patient will need to be set up for outpatient dialysis.  Consultants  . Nephrology . Vascular Surgery  Procedures   . PermCath placement . Dialysis  Antibiotics   Anti-infectives (From admission, onward)   Start     Dose/Rate Route Frequency Ordered Stop   08/09/19 0000  ceFAZolin (ANCEF) IVPB 1 g/50 mL premix  Status:  Discontinued    Note to Pharmacy: To be given in specials   1 g 100 mL/hr over 30 Minutes Intravenous  Once 08/08/19 1022 08/08/19 1048   08/08/19 0845  ceFAZolin (ANCEF) IVPB 1 g/50 mL premix     1 g 100 mL/hr over 30 Minutes Intravenous  Once 08/08/19 0844 08/08/19 1018    .   Subjective  The patient is seen following dialysis. He has no new complaints.  Objective   Vitals:  Vitals:   08/08/19 1215 08/08/19 1230  BP: 131/79 133/73  Pulse: (!) 58 74  Resp: 16 (!) 21  Temp:    SpO2: 98% 100%   Exam:  Constitutional:  . The patient is awake, alert, and oriented x 3. No acute distress. Respiratory:  . No increased work of breathing. . No wheezes, rales, or rhonchi . No tactile fremitus Cardiovascular:  . Regular rate and rhythm . No murmurs, ectopy, or gallups. . No lateral PMI. No thrills. Abdomen:  . Abdomen is soft, non-tender, non-distended . No hernias, masses, or organomegaly . Normoactive bowel sounds.  Musculoskeletal:  . No cyanosis, clubbing, or edema Skin:  . No rashes, lesions, ulcers . palpation of skin: no induration or nodules Neurologic:  . CN 2-12 intact . Sensation all 4 extremities intact Psychiatric:  . Mental status o Mood, affect appropriate o Orientation to person, place, time  . judgment and insight appear intact  I have personally reviewed the following:   Today's Data  . Vitals, BMP,  CBC  Scheduled Meds: . allopurinol  100 mg Oral Daily  . Chlorhexidine Gluconate Cloth  6 each Topical Q0600  . diltiazem  240 mg Oral Daily  . [START ON 08/09/2019] epoetin (EPOGEN/PROCRIT) injection  4,000 Units Intravenous Q T,Th,Sa-HD  . fentaNYL      . irbesartan  300 mg Oral Daily   And  . hydrochlorothiazide  12.5 mg Oral Daily  .  midazolam      . pyridOXINE  50 mg Oral Daily  . rosuvastatin  10 mg Oral Daily  . sodium chloride flush  3 mL Intravenous Q12H  . tamsulosin  0.4 mg Oral Daily   Continuous Infusions: . sodium chloride      Active Problems:   ESRD (end stage renal disease) (HCC)   Essential hypertension   Anemia of chronic kidney failure, stage 5 (HCC)   LOS: 1 day   A & P   Stage V chronic kidney disease: The patient has been admitted for initiation of dialysis. He has been having uremic symptoms.  He has undergone placement of a PermCath by vascular surgery today, and dialysis following that. Plan is for fistula placement on Wednesday. The patient will need arrangement of outpatient dialysis placement. Nephrology has been consulted. I appreciate their help. 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  Essential Hypertension: Blood pressure is controlled on diltiazem, HCTZ and ARB.  DM II: Glucoses will be followed FSBS and SSI. Will check HbA1c.  Anemia of chronic kidney disease: H&H is stable. Monitor.  Morbid Obesity: Complicates all cares. Recommendation is for sensible weight loss to be guided by the patient's PCP.  I have seen and examined this patient myself. I have spent 34 minutes in his evaluation and care.  DVT prophylaxis: SCD Code Status: Full Family Communication: Plan of care was discussed with patient in detail.  Verbalizes understanding and agrees with the plan Disposition Plan: Back to previous home environment  Dacian Orrico, DO [facility] Direct contact: see [email or web address]  7PM-7AM contact night coverage as above 08/08/2019, 3:27 PM  LOS: 1 day

## 2019-08-08 NOTE — Consult Note (Addendum)
[facility] Vascular Consult Note  MRN : [id number]  Peter Pearson is a 64 y.o. (08-28-1955) male who presents with chief complaint of worsening renal function.  History of Present Illness:  The patient is a 64 year old male with multiple medical issues (see below) including chronic kidney disease stage V who was directly admitted to [facility] due to worsening kidney function and the need to initiate dialysis.  Patient endorses a history of known chronic kidney disease and has been under the care of a nephrologist.  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  Unfortunately, since his kidney disease has progressed nephrology would like to start dialysis.  At this time, the patient denies any shortness of breath or chest pain.  He denies any fever, nausea vomiting.  Vascular surgery was consulted by Dr. Inocente Salles to place a PermCath to allow the patient to start dialysis.  Current Facility-Administered Medications  Medication Dose Route Frequency Provider Last Rate Last Admin  . fentaNYL (SUBLIMAZE) 100 MCG/2ML injection           . midazolam (VERSED) 5 MG/5ML injection           . [MAR Hold] 0.9 %  sodium chloride infusion  250 mL Intravenous PRN Agbata, Tochukwu, MD      . Doug Sou Hold] acetaminophen (TYLENOL) tablet 650 mg  650 mg Oral Q6H PRN Agbata, Tochukwu, MD       Or  . Doug Sou Hold] acetaminophen (TYLENOL) suppository 650 mg  650 mg Rectal Q6H PRN Agbata, Tochukwu, MD      . Doug Sou Hold] allopurinol (ZYLOPRIM) tablet 100 mg  100 mg Oral Daily Agbata, Tochukwu, MD      . Doug Sou Hold] ceFAZolin (ANCEF) IVPB 1 g/50 mL premix  1 g Intravenous Once Algernon Huxley, MD 100 mL/hr at 08/08/19 0948 1 g at 08/08/19 0948  . [MAR Hold] Chlorhexidine Gluconate Cloth 2 % PADS 6 each  6 each Topical Q0600 Lateef, Munsoor, MD      . Doug Sou Hold] diltiazem (CARDIZEM  CD) 24 hr capsule 240 mg  240 mg Oral Daily Agbata, Tochukwu, MD      . fentaNYL (SUBLIMAZE) injection    PRN Algernon Huxley, MD   25 mcg at 08/08/19 0955  . [MAR Hold] irbesartan (AVAPRO) tablet 300 mg  300 mg Oral Daily Agbata, Tochukwu, MD       And  . Doug Sou Hold] hydrochlorothiazide (MICROZIDE) capsule 12.5 mg  12.5 mg Oral Daily Agbata, Tochukwu, MD      . midazolam (VERSED) injection    PRN Algernon Huxley, MD   1 mg at 08/08/19 0955  . [MAR Hold] ondansetron (ZOFRAN) tablet 4 mg  4 mg Oral Q6H PRN Agbata, Tochukwu, MD       Or  . Doug Sou Hold] ondansetron (ZOFRAN) injection 4 mg  4 mg Intravenous Q6H PRN Agbata, Tochukwu, MD      . Doug Sou Hold] pyridOXINE (VITAMIN B-6) tablet 50 mg  50 mg Oral Daily Agbata, Tochukwu, MD      . Doug Sou Hold] rosuvastatin (CRESTOR) tablet 10 mg  10 mg Oral Daily Agbata, Tochukwu, MD      . Doug Sou Hold] sodium chloride flush (NS) 0.9 % injection 3 mL  3 mL Intravenous Q12H Agbata, Tochukwu, MD   3 mL at 08/07/19 2317  . [MAR Hold] sodium chloride flush (NS) 0.9 %  injection 3 mL  3 mL Intravenous PRN Agbata, Tochukwu, MD      . Doug Sou Hold] tamsulosin (FLOMAX) capsule 0.4 mg  0.4 mg Oral Daily Agbata, Tochukwu, MD       Past Medical History:  Diagnosis Date  . Chronic kidney disease   . Hyperlipidemia   . Hypertension   . Sleep apnea    History reviewed. No pertinent surgical history.  Social History Social History   Tobacco Use  . Smoking status: Former Smoker    Quit date: 08/06/1989    Years since quitting: 30.0  . Smokeless tobacco: Never Used  Substance Use Topics  . Alcohol use: Never  . Drug use: Never   Family History Family History  Problem Relation Age of Onset  . Heart disease Father   Denies family history of peripheral artery disease, venous disease or renal disease.  No Known Allergies  REVIEW OF SYSTEMS (Negative unless checked)  Constitutional: [] Weight loss  [] Fever  [] Chills Cardiac: [] Chest pain   [] Chest pressure   [] Palpitations    [] Shortness of breath when laying flat   [] Shortness of breath at rest   [] Shortness of breath with exertion. Vascular:  [] Pain in legs with walking   [] Pain in legs at rest   [] Pain in legs when laying flat   [] Claudication   [] Pain in feet when walking  [] Pain in feet at rest  [] Pain in feet when laying flat   [] History of DVT   [] Phlebitis   [x] Swelling in legs   [] Varicose veins   [] Non-healing ulcers Pulmonary:   [] Uses home oxygen   [] Productive cough   [] Hemoptysis   [] Wheeze  [] COPD   [] Asthma Neurologic:  [] Dizziness  [] Blackouts   [] Seizures   [] History of stroke   [] History of TIA  [] Aphasia   [] Temporary blindness   [] Dysphagia   [] Weakness or numbness in arms   [] Weakness or numbness in legs Musculoskeletal:  [] Arthritis   [] Joint swelling   [] Joint pain   [] Low back pain Hematologic:  [] Easy bruising  [] Easy bleeding   [] Hypercoagulable state   [x] Anemic  [] Hepatitis Gastrointestinal:  [] Blood in stool   [] Vomiting blood  [] Gastroesophageal reflux/heartburn   [] Difficulty swallowing. Genitourinary:  [x] Chronic kidney disease   [] Difficult urination  [] Frequent urination  [] Burning with urination   [] Blood in urine Skin:  [] Rashes   [] Ulcers   [] Wounds Psychological:  [] History of anxiety   []  History of major depression.  Physical Examination  Vitals:   08/08/19 0934 08/08/19 0948 08/08/19 0955 08/08/19 1001  BP: 137/72     Pulse: 71     Resp: 18     Temp:      TempSrc:      SpO2: 100% 100% 100% 97%  Weight: 125.2 kg     Height: 5\' 8"  (1.727 m)      Body mass index is 41.97 kg/m. Gen:  WD/WN, NAD Head: [place]/AT, No temporalis wasting. Prominent temp pulse not noted. Ear/Nose/Throat: Hearing grossly intact, nares w/o erythema or drainage, oropharynx w/o Erythema/Exudate Eyes: Sclera non-icteric, conjunctiva clear Neck: Trachea midline.  No JVD.  Pulmonary:  Good air movement, respirations not labored, equal bilaterally.  Cardiac: RRR, normal S1, S2. Vascular:  Vessel Right  Left  Radial Palpable Palpable  Ulnar Palpable Palpable  Brachial Palpable Palpable  Carotid Palpable, without bruit Palpable, without bruit  Aorta Not palpable N/A  Femoral Palpable Palpable  Popliteal Palpable Palpable  PT Palpable Palpable  DP Palpable Palpable   Gastrointestinal: soft,  non-tender/non-distended. No guarding/reflex.  Musculoskeletal: M/S 5/5 throughout.  Extremities without ischemic changes.  No deformity or atrophy. No edema. Neurologic: Sensation grossly intact in extremities.  Symmetrical.  Speech is fluent. Motor exam as listed above. Psychiatric: Judgment intact, Mood & affect appropriate for pt's clinical situation. Dermatologic: No rashes or ulcers noted.  No cellulitis or open wounds. Lymph : No Cervical, Axillary, or Inguinal lymphadenopathy.  CBC Lab Results  Component Value Date   WBC 9.6 08/08/2019   HGB 8.6 (L) 08/08/2019   HCT 26.4 (L) 08/08/2019   MCV 88.0 08/08/2019   PLT 273 08/08/2019   BMET    Component Value Date/Time   NA 138 08/08/2019 0619   K 3.9 08/08/2019 0619   CL 110 08/08/2019 0619   CO2 14 (L) 08/08/2019 0619   GLUCOSE 114 (H) 08/08/2019 0619   BUN 134 (H) 08/08/2019 0619   CREATININE 9.79 (H) 08/08/2019 0619   CALCIUM 7.2 (L) 08/08/2019 0619   GFRNONAA 5 (L) 08/08/2019 0619   GFRAA 6 (L) 08/08/2019 0619   Estimated Creatinine Clearance: 10.1 mL/min (A) (by C-G formula based on SCr of 9.79 mg/dL (H)).  COAG Lab Results  Component Value Date   INR 1.2 08/08/2019   Radiology DG Chest Port 1 View  Result Date: 08/07/2019 CLINICAL DATA:  Shortness of breath for a month, denies chest pain and cough EXAM: PORTABLE CHEST 1 VIEW COMPARISON:  Radiograph 06/08/2018, 10/12/2000 FINDINGS: Slightly bulbous configuration of the right main pulmonary artery, could reflect some right hilar adenopathy or pulmonary arterial congestion. No consolidative opacity is seen. Lung volumes are low with hazy interstitial changes and cephalized  vascular markings. Cardiac silhouette is upper limits normal. No acute osseous or soft tissue abnormality. Telemetry leads overlie the chest. IMPRESSION: Features of central vascular congestion. Convex appearance of the right main pulmonary artery/hilum, nonspecific and possibly artifactual but could reflect hilar adenopathy, consider CT imaging. Electronically Signed   By: Lovena Le M.D.   On: 08/07/2019 15:16   Assessment/Plan The patient is a 64 year old male with multiple medical issues including chronic kidney disease stage V which is now progressed to end-stage.  Nephrology would like to initiate dialysis however the patient does not have an adequate access.  1.  End-stage renal disease: Patient with progressively worsening renal function.  Nephrology would like to start dialysis however the patient does not have an adequate dialysis access.  Recommend placing a PermCath to allow the patient to start dialysis immediately and continue in the inpatient outpatient setting.  If the OR schedule permits, we will plan on creating a left upper extremity AV fistula versus graft this Wed.   2.  Anemia of chronic disease: Currently asymptomatic. This will be followed by the patient's nephrologist and primary care physician. No recommendations for vascular surgery at this time.  3.  Hypertension: On appropriate medications. Encouraged good control as its slows the progression of atherosclerotic disease  Discussed with Dr. Mayme Genta, PA-C  08/08/2019 10:04 AM  This note was created with Dragon medical transcription system.  Any error is purely unintentional.

## 2019-08-08 NOTE — H&P (Signed)
 [facility] History & Physical Update  The patient was interviewed and re-examined.  The patient's previous History and Physical has been reviewed and is unchanged.  There is no change in the plan of care. We plan to proceed with the scheduled procedure.  Leotis Pain, MD  08/08/2019, 9:49 AM

## 2019-08-09 ENCOUNTER — Inpatient Hospital Stay: Payer: PRIVATE HEALTH INSURANCE

## 2019-08-09 ENCOUNTER — Encounter: Payer: Self-pay | Admitting: Internal Medicine

## 2019-08-09 LAB — BASIC METABOLIC PANEL
Anion gap: 18 — ABNORMAL HIGH (ref 5–15)
BUN: 116 mg/dL — ABNORMAL HIGH (ref 8–23)
CO2: 14 mmol/L — ABNORMAL LOW (ref 22–32)
Calcium: 7.8 mg/dL — ABNORMAL LOW (ref 8.9–10.3)
Chloride: 106 mmol/L (ref 98–111)
Creatinine, Ser: 8.57 mg/dL — ABNORMAL HIGH (ref 0.61–1.24)
GFR calc Af Amer: 7 mL/min — ABNORMAL LOW (ref >60)
GFR calc non Af Amer: 6 mL/min — ABNORMAL LOW (ref >60)
Glucose, Bld: 122 mg/dL — ABNORMAL HIGH (ref 70–99)
Potassium: 3.7 mmol/L (ref 3.5–5.1)
Sodium: 138 mmol/L (ref 135–145)

## 2019-08-09 LAB — PARATHYROID HORMONE, INTACT (NO CA): PTH: 349 pg/mL — ABNORMAL HIGH (ref 15–65)

## 2019-08-09 LAB — CBC
HCT: 27.1 % — ABNORMAL LOW (ref 39.0–52.0)
Hemoglobin: 8.7 g/dL — ABNORMAL LOW (ref 13.0–17.0)
MCH: 28.5 pg (ref 26.0–34.0)
MCHC: 32.1 g/dL (ref 30.0–36.0)
MCV: 88.9 fL (ref 80.0–100.0)
Platelets: 255 10*3/uL (ref 150–400)
RBC: 3.05 MIL/uL — ABNORMAL LOW (ref 4.22–5.81)
RDW: 14.4 % (ref 11.5–15.5)
WBC: 9.4 10*3/uL (ref 4.0–10.5)
nRBC: 0 % (ref 0.0–0.2)

## 2019-08-09 LAB — GLUCOSE, CAPILLARY
Glucose-Capillary: 164 mg/dL — ABNORMAL HIGH (ref 70–99)
Glucose-Capillary: 143 mg/dL — ABNORMAL HIGH (ref 70–99)

## 2019-08-09 LAB — TYPE AND SCREEN
ABO/RH(D): A POS
Antibody Screen: NEGATIVE

## 2019-08-09 LAB — PHOSPHORUS: Phosphorus: 7.9 mg/dL — ABNORMAL HIGH (ref 2.5–4.6)

## 2019-08-09 LAB — HEMOGLOBIN A1C
Hgb A1c MFr Bld: 6.3 % — ABNORMAL HIGH (ref 4.8–5.6)
Mean Plasma Glucose: 134.11 mg/dL

## 2019-08-09 LAB — ABO/RH: ABO/RH(D): A POS

## 2019-08-09 MED ORDER — INSULIN ASPART 100 UNIT/ML ~~LOC~~ SOLN
0.0000 [IU] | Freq: Three times a day (TID) | SUBCUTANEOUS | Status: DC
  Filled 2019-08-09: qty 1

## 2019-08-09 NOTE — Progress Notes (Addendum)
PROGRESS NOTE  Peter Pearson MGN:[id number] DOB: 10/13/1955 DOA: 08/07/2019 PCP: System, Pcp Not In  Brief History   Peter Pearson is a 64 y.o. 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 Previously patient was followed by [facility] nephrology but requested second opinion. Patient has known severe renal insufficiency. Most recent BUN is 122 with a creatinine of 9 and EGFR of 6.  Serum bicarbonate level is 17.Serum phosphorus was also quite high at 9.1. He also has significant proteinuria with most recent albumin to creatinine ratio of 4065. He also has evidence of anemia chronic kidney disease with most recent hemoglobin of 8.3. In addition patient has additional complication in the form of secondary hyperparathyroidism with most recent PTH of 776. He was previously advised to pursue renal placement therapy but wanted to consider this a bit further.  Patient complains of exertional shortness of breath associated with lower extremity swelling, nausea and anorexia.  Denies having any chest pain, dizziness, light headedness, cough, fever or chills He has now agreed to undergo hemodialysis.  Plan is for patient to get PermCath placed in a.m. prior to initiation of dialysis.  The patient has had Permcath placed on 08/08/2019 and has undergone dialysis. Vascular surgery has been consulted for fistula placement. This has been planned for Wednesday. The patient will need to be set up for outpatient dialysis. Fistula to be placed tomorrow.   Consultants   . Nephrology . Vascular Surgery  Procedures  . PermCath placement . Dialysis  Antibiotics   Anti-infectives (From admission, onward)   Start     Dose/Rate Route Frequency Ordered Stop   08/09/19 0000  ceFAZolin (ANCEF) IVPB 1 g/50 mL premix  Status:  Discontinued    Note to Pharmacy: To be given in specials   1 g 100 mL/hr over 30 Minutes Intravenous  Once 08/08/19 1022 08/08/19 1048   08/08/19 0845  ceFAZolin (ANCEF) IVPB 1 g/50 mL premix     1 g 100 mL/hr over 30 Minutes Intravenous  Once 08/08/19 0844 08/08/19 1018      Subjective  The patient is seen following dialysis. He has no new complaints.  Objective   Vitals:  Vitals:   08/09/19 1210 08/09/19 1341  BP: 120/73 (!) 167/99  Pulse: 78 93  Resp: (!) 21 16  Temp:  97.6 F (36.4 C)  SpO2: 100% 98%   Exam:  Constitutional:  . The patient is awake, alert, and oriented x 3. No acute distress. Respiratory:  . No increased work of breathing. . No wheezes, rales, or rhonchi . No tactile fremitus Cardiovascular:  . Regular rate and rhythm . No murmurs, ectopy, or gallups. . No lateral PMI. No thrills. Abdomen:  . Abdomen is soft, non-tender, non-distended . No hernias, masses, or organomegaly . Normoactive bowel sounds.  Musculoskeletal:  . No cyanosis, clubbing, or edema Skin:  . No rashes, lesions, ulcers . palpation of skin: no induration or nodules Neurologic:  . CN 2-12 intact . Sensation all 4 extremities intact Psychiatric:  . Mental status o Mood, affect appropriate o Orientation to person, place, time  . judgment and insight appear intact  I have personally reviewed the following:  Today's Data  . Vitals, BMP, CBC, Quantiferon Gold - pending.  Scheduled Meds: . allopurinol  100 mg Oral Daily  . Chlorhexidine Gluconate Cloth  6 each Topical Q0600  . diltiazem  240 mg Oral Daily  . epoetin (EPOGEN/PROCRIT) injection  4,000 Units Intravenous Q T,Th,Sa-HD  . irbesartan  300 mg Oral  Daily  . pyridOXINE  50 mg Oral Daily  . rosuvastatin  10 mg Oral Daily  . sodium chloride flush  3 mL Intravenous Q12H  . tamsulosin  0.4 mg Oral Daily   Continuous Infusions: . sodium chloride      Active Problems:   ESRD (end stage renal disease) (HCC)   Essential hypertension   Anemia of chronic kidney failure, stage 5 (HCC)   LOS: 2 days   A & P   Stage V chronic kidney disease: The patient has been admitted for initiation of dialysis. He has been having uremic symptoms.  He has undergone placement of a PermCath by vascular surgery today, and dialysis following that. Plan is for fistula placement on Wednesday. The patient will need arrangement of outpatient dialysis placement. Nephrology has been consulted. I appreciate their help. 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  Essential Hypertension: Blood pressure is controlled on diltiazem, HCTZ and ARB.  DM II: Glucoses will be followed FSBS and SSI. Will check HbA1c. Glucoses followed with FSBS and SSI.  Anemia of chronic kidney disease: H&H is stable. Monitor.  Morbid Obesity: Complicates all cares. Recommendation is for sensible weight loss to be guided by the patient's PCP.  I have seen and examined this patient myself. I have spent 32 minutes in his evaluation and care.  DVT prophylaxis: SCD Code Status: Full Family Communication: Plan of care was discussed with patient in detail.  Verbalizes understanding and agrees with the plan Disposition Plan: From home. Back to previous home environment once fistula is placed and outpatient dialysis is set up.  Damoni Causby, DO [facility] Direct contact: see [email or web address]  7PM-7AM contact night coverage as above 08/08/2019, 3:27 PM  LOS: 1 day

## 2019-08-09 NOTE — Progress Notes (Signed)
[facility] Daily Progress Note   Subjective: 08/08/19: 1. Ultrasound guidance for vascular access to the right internal jugular vein 2. Fluoroscopic guidance for placement of catheter 3. Placement of a 23 cm tip to cuff tunneled hemodialysis catheter via the right internal jugular vein  Patient had PermCath insertion yesterday.  No issues overnight.  Dialyzing appropriately through PermCath.  Plan for a left upper extremity AV fistula versus brachial axillary graft tomorrow.  Objective: Vitals:   08/09/19 1145 08/09/19 1200 08/09/19 1210 08/09/19 1341  BP: 120/70 129/75 120/73 (!) 167/99  Pulse: 77 83 78 93  Resp: 16 19 (!) 21 16  Temp:    97.6 F (36.4 C)  TempSrc:    Oral  SpO2: 99% 100% 100% 98%  Weight:      Height:        Intake/Output Summary (Last 24 hours) at 08/09/2019 1449 Last data filed at 08/09/2019 0500 Gross per 24 hour  Intake 240 ml  Output 0 ml  Net 240 ml   Physical Exam: A&Ox3, NAD Chest:  Right: PermCath intact, clean and dry.  No swelling or drainage. CV: RRR Pulmonary: CTA Bilaterally Abdomen: Soft, Nontender, Nondistended Vascular:  Left upper extremity: Skin is intact.  2+ radial pulse.   Laboratory: CBC    Component Value Date/Time   WBC 9.4 08/09/2019 0411   HGB 8.7 (L) 08/09/2019 0411   HCT 27.1 (L) 08/09/2019 0411   PLT 255 08/09/2019 0411   BMET    Component Value Date/Time   NA 138 08/09/2019 0411   K 3.7 08/09/2019 0411   CL 106 08/09/2019 0411   CO2 14 (L) 08/09/2019 0411   GLUCOSE 122 (H) 08/09/2019 0411   BUN 116 (H) 08/09/2019 0411   CREATININE 8.57 (H) 08/09/2019 0411   CALCIUM 7.8 (L) 08/09/2019 0411   GFRNONAA 6 (L) 08/09/2019 0411   GFRAA 7 (L) 08/09/2019 0411   Assessment/Planning: The patient is a 64 year old male who presents with renal failure.    1) PermCath insertion: POD#1 Functioning well.  2) Creation Left Upper Extremity AV fistula vs Brachial- Axillary Graft Plan for creation  tomorrow in OR. Had long conversation with patient and family member present in the room in regard to the difference between fistula graft.  They understand creation will based on the size of his veins.  He is right hand dominant.  We also discussed having to wait approximately 4 to 6 weeks before cannulating his new access.  Procedures, risks and benefits explained to the patient and his family member present in the room.  All questions answered.  Patient wishes to proceed.  Discussed with Dr. Ellis Parents Mayci Haning PA-C 08/09/2019 2:49 PM

## 2019-08-09 NOTE — Progress Notes (Signed)
[facility]  ROUNDING NOTE   Subjective:  Patient seen and evaluated at bedside this AM. Due for second dialysis treatment today. Tolerated first treatment quite well. Case discussed with vascular surgery and they will be placing fistula tomorrow. Overall patient in good spirits. Outpatient hemodialysis center placement pending at [facility].  Objective:  Vital signs in last 24 hours:  Temp:  [98 F (36.7 C)-98.6 F (37 C)] 98.3 F (36.8 C) (03/09 0501) Pulse Rate:  [58-87] 79 (03/09 0501) Resp:  [12-24] 24 (03/09 0501) BP: (101-144)/(58-117) 115/64 (03/09 0501) SpO2:  [96 %-100 %] 99 % (03/09 0501)  Weight change: 0 kg Filed Weights   08/07/19 1100 08/08/19 0934  Weight: 125.2 kg 125.2 kg    Intake/Output: I/O last 3 completed shifts: In: 240 [P.O.:240] Out: 0    Intake/Output this shift:  No intake/output data recorded.  Physical Exam: General: No acute distress  Head: Normocephalic, atraumatic. Moist oral mucosal membranes  Eyes: Anicteric  Neck: Supple, trachea midline  Lungs:  Clear to auscultation, normal effort  Heart: S1S2 no rubs  Abdomen:  Soft, nontender, bowel sounds present  Extremities: 1+ peripheral edema.  Neurologic: Awake, alert, following commands  Skin: No lesions  Access: Right internal jugular PermCath in place    Basic Metabolic Panel: Recent Labs  Lab 08/07/19 1240 08/08/19 0619 08/08/19 1413 08/09/19 0411  NA 135 138  --  138  K 3.8 3.9  --  3.7  CL 105 110  --  106  CO2 17* 14*  --  14*  GLUCOSE 88 114*  --  122*  BUN 122* 134*  --  116*  CREATININE 9.00* 9.79*  --  8.57*  CALCIUM 7.5* 7.2*  --  7.8*  MG 2.5*  --   --   --   PHOS 10.3*  --  8.1*  --     Liver Function Tests: Recent Labs  Lab 08/07/19 1240  AST 11*  ALT 14  ALKPHOS 73  BILITOT 0.5  PROT 6.9  ALBUMIN 3.5   No results for input(s): LIPASE, AMYLASE in the last 168 hours. No results for input(s): AMMONIA in the last 168  hours.  CBC: Recent Labs  Lab 08/07/19 1240 08/08/19 0619 08/09/19 0411  WBC 9.7 9.6 9.4  HGB 8.9* 8.6* 8.7*  HCT 26.8* 26.4* 27.1*  MCV 87.3 88.0 88.9  PLT 269 273 255    Cardiac Enzymes: No results for input(s): CKTOTAL, CKMB, CKMBINDEX, TROPONINI in the last 168 hours.  BNP: Invalid input(s): POCBNP  CBG: No results for input(s): GLUCAP in the last 168 hours.  Microbiology: Results for orders placed or performed during the hospital encounter of 08/07/19  SARS CORONAVIRUS 2 (TAT 6-24 HRS) Nasopharyngeal Nasopharyngeal Swab     Status: None   Collection Time: 08/07/19  3:37 PM   Specimen: Nasopharyngeal Swab  Result Value Ref Range Status   SARS Coronavirus 2 NEGATIVE NEGATIVE Final    Comment: (NOTE) SARS-CoV-2 target nucleic acids are NOT DETECTED. The SARS-CoV-2 RNA is generally detectable in upper and lower respiratory specimens during the acute phase of infection. Negative results do not preclude SARS-CoV-2 infection, do not rule out co-infections with other pathogens, and should not be used as the sole basis for treatment or other patient management decisions. Negative results must be combined with clinical observations, patient history, and epidemiological information. The expected result is Negative. Fact Sheet for Patients: SugarRoll.be Fact Sheet for Healthcare Providers: https://www.woods-mathews.com/ This test is not yet approved  or cleared by the [place] and  has been authorized for detection and/or diagnosis of SARS-CoV-2 by FDA under an Emergency Use Authorization (EUA). This EUA will remain  in effect (meaning this test can be used) for the duration of the COVID-19 declaration under Section 56 4(b)(1) of the Act, 21 U.S.C. section 360bbb-3(b)(1), unless the authorization is terminated or revoked sooner. Performed at [facility], [place] [address] [place], [place] [address]     Coagulation  Studies: Recent Labs    08/08/19 0619  LABPROT 14.7  INR 1.2    Urinalysis: No results for input(s): COLORURINE, LABSPEC, PHURINE, GLUCOSEU, HGBUR, BILIRUBINUR, KETONESUR, PROTEINUR, UROBILINOGEN, NITRITE, LEUKOCYTESUR in the last 72 hours.  Invalid input(s): APPERANCEUR    Imaging: PERIPHERAL VASCULAR CATHETERIZATION  Result Date: 08/08/2019 See op note  DG Chest Port 1 View  Result Date: 08/07/2019 CLINICAL DATA:  Shortness of breath for a month, denies chest pain and cough EXAM: PORTABLE CHEST 1 VIEW COMPARISON:  Radiograph 06/08/2018, 10/12/2000 FINDINGS: Slightly bulbous configuration of the right main pulmonary artery, could reflect some right hilar adenopathy or pulmonary arterial congestion. No consolidative opacity is seen. Lung volumes are low with hazy interstitial changes and cephalized vascular markings. Cardiac silhouette is upper limits normal. No acute osseous or soft tissue abnormality. Telemetry leads overlie the chest. IMPRESSION: Features of central vascular congestion. Convex appearance of the right main pulmonary artery/hilum, nonspecific and possibly artifactual but could reflect hilar adenopathy, consider CT imaging. Electronically Signed   By: Lovena Le M.D.   On: 08/07/2019 15:16     Medications:   . sodium chloride     . allopurinol  100 mg Oral Daily  . Chlorhexidine Gluconate Cloth  6 each Topical Q0600  . diltiazem  240 mg Oral Daily  . epoetin (EPOGEN/PROCRIT) injection  4,000 Units Intravenous Q T,Th,Sa-HD  . irbesartan  300 mg Oral Daily  . pyridOXINE  50 mg Oral Daily  . rosuvastatin  10 mg Oral Daily  . sodium chloride flush  3 mL Intravenous Q12H  . tamsulosin  0.4 mg Oral Daily   sodium chloride, acetaminophen **OR** acetaminophen, HYDROmorphone (DILAUDID) injection, ondansetron (ZOFRAN) IV, ondansetron **OR** [DISCONTINUED] ondansetron (ZOFRAN) IV, sodium chloride flush  Assessment/ Plan:  64 y.o. 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  1.  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    Successful PermCath placement 08/08/2019.  First dialysis treatment 08/09/2019. -Patient due for second hemodialysis treatment today.  Tolerated first treatment quite well.  Ultrafiltration target today 0.5 kg.  Outpatient hemodialysis center placement pending at [facility].  Case discussed with vascular surgery today and they will be placing AV fistula tomorrow at 2 PM.  We will plan for dialysis again after surgery tomorrow.  2.  Hypertension.  Continue diltiazem and irbesartan.  3.  Anemia of chronic kidney disease.  Administer Epogen 4000 units IV with dialysis treatment today.  4.  Secondary hyperparathyroidism.  Phosphorus down to 8.1.  Continue to monitor closely.   LOS: 2 Peter Pearson 3/9/20219:35 AM

## 2019-08-10 ENCOUNTER — Encounter
Admission: AD | Disposition: A | Discharge status: Home / Self Care | Payer: Self-pay | Source: Ambulatory Visit | Attending: Internal Medicine

## 2019-08-10 ENCOUNTER — Inpatient Hospital Stay: Payer: PRIVATE HEALTH INSURANCE | Admitting: Anesthesiology

## 2019-08-10 ENCOUNTER — Encounter: Payer: Self-pay | Admitting: Internal Medicine

## 2019-08-10 DIAGNOSIS — N185 Chronic kidney disease, stage 5: Secondary | ICD-10-CM

## 2019-08-10 HISTORY — PX: AV FISTULA PLACEMENT: SHX1204

## 2019-08-10 LAB — GLUCOSE, CAPILLARY
Glucose-Capillary: 107 mg/dL — ABNORMAL HIGH (ref 70–99)
Glucose-Capillary: 154 mg/dL — ABNORMAL HIGH (ref 70–99)
Glucose-Capillary: 207 mg/dL — ABNORMAL HIGH (ref 70–99)
Glucose-Capillary: 343 mg/dL — ABNORMAL HIGH (ref 70–99)

## 2019-08-10 LAB — BASIC METABOLIC PANEL
Anion gap: 10 (ref 5–15)
BUN: 76 mg/dL — ABNORMAL HIGH (ref 8–23)
CO2: 26 mmol/L (ref 22–32)
Calcium: 7.7 mg/dL — ABNORMAL LOW (ref 8.9–10.3)
Chloride: 101 mmol/L (ref 98–111)
Creatinine, Ser: 6.75 mg/dL — ABNORMAL HIGH (ref 0.61–1.24)
GFR calc Af Amer: 9 mL/min — ABNORMAL LOW (ref >60)
GFR calc non Af Amer: 8 mL/min — ABNORMAL LOW (ref >60)
Glucose, Bld: 124 mg/dL — ABNORMAL HIGH (ref 70–99)
Potassium: 3.4 mmol/L — ABNORMAL LOW (ref 3.5–5.1)
Sodium: 137 mmol/L (ref 135–145)

## 2019-08-10 LAB — PROTIME-INR
INR: 1.1 (ref 0.8–1.2)
Prothrombin Time: 14 seconds (ref 11.4–15.2)

## 2019-08-10 LAB — HEPATITIS B SURFACE ANTIBODY, QUANTITATIVE: Hep B S AB Quant (Post): 4 m[IU]/mL — ABNORMAL LOW (ref >9.9)

## 2019-08-10 LAB — APTT: aPTT: 32 seconds (ref 24–36)

## 2019-08-10 LAB — CBC
HCT: 27.3 % — ABNORMAL LOW (ref 39.0–52.0)
Hemoglobin: 9.1 g/dL — ABNORMAL LOW (ref 13.0–17.0)
MCH: 29 pg (ref 26.0–34.0)
MCHC: 33.3 g/dL (ref 30.0–36.0)
MCV: 86.9 fL (ref 80.0–100.0)
Platelets: 238 10*3/uL (ref 150–400)
RBC: 3.14 MIL/uL — ABNORMAL LOW (ref 4.22–5.81)
RDW: 13.8 % (ref 11.5–15.5)
WBC: 10.3 10*3/uL (ref 4.0–10.5)
nRBC: 0 % (ref 0.0–0.2)

## 2019-08-10 LAB — MAGNESIUM: Magnesium: 2.2 mg/dL (ref 1.7–2.4)

## 2019-08-10 SURGERY — ARTERIOVENOUS (AV) FISTULA CREATION
Anesthesia: General | Laterality: Left

## 2019-08-10 MED ORDER — SUGAMMADEX SODIUM 500 MG/5ML IV SOLN
INTRAVENOUS | Status: DC | PRN
  Administered 2019-08-10: 300 mg via INTRAVENOUS

## 2019-08-10 MED ORDER — DEXAMETHASONE SODIUM PHOSPHATE 10 MG/ML IJ SOLN
INTRAMUSCULAR | Status: AC
  Filled 2019-08-10: qty 1

## 2019-08-10 MED ORDER — PHENYLEPHRINE HCL (PRESSORS) 10 MG/ML IV SOLN
INTRAVENOUS | Status: DC | PRN
  Administered 2019-08-10 (×5): 100 ug via INTRAVENOUS

## 2019-08-10 MED ORDER — FENTANYL CITRATE (PF) 100 MCG/2ML IJ SOLN
INTRAMUSCULAR | Status: DC | PRN
  Administered 2019-08-10: 50 ug via INTRAVENOUS

## 2019-08-10 MED ORDER — FENTANYL CITRATE (PF) 100 MCG/2ML IJ SOLN
25.0000 ug | INTRAMUSCULAR | Status: DC | PRN
  Administered 2019-08-10 (×3): 25 ug via INTRAVENOUS

## 2019-08-10 MED ORDER — HEPARIN SODIUM (PORCINE) 1000 UNIT/ML IJ SOLN
INTRAMUSCULAR | Status: DC | PRN
  Administered 2019-08-10: 3000 [IU] via INTRAVENOUS

## 2019-08-10 MED ORDER — ONDANSETRON HCL 4 MG/2ML IJ SOLN
INTRAMUSCULAR | Status: AC
  Filled 2019-08-10: qty 2

## 2019-08-10 MED ORDER — HEMOSTATIC AGENTS (NO CHARGE) OPTIME
TOPICAL | Status: DC | PRN
  Administered 2019-08-10: 1 via TOPICAL

## 2019-08-10 MED ORDER — SUCCINYLCHOLINE CHLORIDE 200 MG/10ML IV SOSY
PREFILLED_SYRINGE | INTRAVENOUS | Status: AC
  Filled 2019-08-10: qty 10

## 2019-08-10 MED ORDER — BUPIVACAINE-EPINEPHRINE (PF) 0.5% -1:200000 IJ SOLN
INTRAMUSCULAR | Status: AC
  Filled 2019-08-10: qty 30

## 2019-08-10 MED ORDER — HEPARIN SODIUM (PORCINE) 5000 UNIT/ML IJ SOLN
INTRAMUSCULAR | Status: AC
  Filled 2019-08-10: qty 1

## 2019-08-10 MED ORDER — LIDOCAINE HCL (PF) 2 % IJ SOLN
INTRAMUSCULAR | Status: AC
  Filled 2019-08-10: qty 5

## 2019-08-10 MED ORDER — INSULIN ASPART 100 UNIT/ML ~~LOC~~ SOLN
SUBCUTANEOUS | Status: AC
  Filled 2019-08-10: qty 1

## 2019-08-10 MED ORDER — LIDOCAINE HCL (CARDIAC) PF 100 MG/5ML IV SOSY
PREFILLED_SYRINGE | INTRAVENOUS | Status: DC | PRN
  Administered 2019-08-10: 100 mg via INTRAVENOUS

## 2019-08-10 MED ORDER — INSULIN ASPART 100 UNIT/ML ~~LOC~~ SOLN: 0.0000 [IU] | Freq: Three times a day (TID) | SUBCUTANEOUS | Status: DC

## 2019-08-10 MED ORDER — GLYCOPYRROLATE 0.2 MG/ML IJ SOLN
INTRAMUSCULAR | Status: AC
  Filled 2019-08-10: qty 1

## 2019-08-10 MED ORDER — VASOPRESSIN 20 UNIT/ML IV SOLN
INTRAVENOUS | Status: DC | PRN
  Administered 2019-08-10: 2 [IU] via INTRAVENOUS

## 2019-08-10 MED ORDER — HYDROCODONE-ACETAMINOPHEN 5-325 MG PO TABS: 1.0000 | ORAL_TABLET | Freq: Four times a day (QID) | ORAL | Status: DC | PRN

## 2019-08-10 MED ORDER — OXYCODONE HCL 5 MG/5ML PO SOLN: 5.0000 mg | Freq: Once | ORAL | Status: DC | PRN

## 2019-08-10 MED ORDER — CEFAZOLIN SODIUM-DEXTROSE 2-3 GM-%(50ML) IV SOLR
INTRAVENOUS | Status: DC | PRN
  Administered 2019-08-10: 2 g via INTRAVENOUS

## 2019-08-10 MED ORDER — ALBUTEROL SULFATE HFA 108 (90 BASE) MCG/ACT IN AERS
INHALATION_SPRAY | RESPIRATORY_TRACT | Status: AC
  Filled 2019-08-10: qty 6.7

## 2019-08-10 MED ORDER — EPHEDRINE 5 MG/ML INJ
INTRAVENOUS | Status: AC
  Filled 2019-08-10: qty 10

## 2019-08-10 MED ORDER — VASOPRESSIN 20 UNIT/ML IV SOLN
INTRAVENOUS | Status: AC
  Filled 2019-08-10: qty 1

## 2019-08-10 MED ORDER — PROPOFOL 10 MG/ML IV BOLUS
INTRAVENOUS | Status: AC
  Filled 2019-08-10: qty 20

## 2019-08-10 MED ORDER — SODIUM CHLORIDE 0.9 % IV SOLN
INTRAVENOUS | Status: DC | PRN
  Administered 2019-08-10: 20 mL via INTRAMUSCULAR

## 2019-08-10 MED ORDER — SUCCINYLCHOLINE CHLORIDE 20 MG/ML IJ SOLN
INTRAMUSCULAR | Status: DC | PRN
  Administered 2019-08-10: 120 mg via INTRAVENOUS

## 2019-08-10 MED ORDER — LACTATED RINGERS IV SOLN: INTRAVENOUS | Status: DC | PRN

## 2019-08-10 MED ORDER — FENTANYL CITRATE (PF) 100 MCG/2ML IJ SOLN
INTRAMUSCULAR | Status: AC
  Administered 2019-08-10: 25 ug via INTRAVENOUS
  Filled 2019-08-10: qty 2

## 2019-08-10 MED ORDER — ROCURONIUM BROMIDE 10 MG/ML (PF) SYRINGE
PREFILLED_SYRINGE | INTRAVENOUS | Status: AC
  Filled 2019-08-10: qty 10

## 2019-08-10 MED ORDER — SUGAMMADEX SODIUM 500 MG/5ML IV SOLN
INTRAVENOUS | Status: AC
  Filled 2019-08-10: qty 5

## 2019-08-10 MED ORDER — MIDAZOLAM HCL 2 MG/2ML IJ SOLN
INTRAMUSCULAR | Status: DC | PRN
  Administered 2019-08-10: .5 mg via INTRAVENOUS

## 2019-08-10 MED ORDER — FENTANYL CITRATE (PF) 100 MCG/2ML IJ SOLN
INTRAMUSCULAR | Status: AC
  Filled 2019-08-10: qty 2

## 2019-08-10 MED ORDER — PROPOFOL 10 MG/ML IV BOLUS
INTRAVENOUS | Status: DC | PRN
  Administered 2019-08-10: 150 mg via INTRAVENOUS

## 2019-08-10 MED ORDER — MIDAZOLAM HCL 2 MG/2ML IJ SOLN
INTRAMUSCULAR | Status: AC
  Filled 2019-08-10: qty 2

## 2019-08-10 MED ORDER — DEXAMETHASONE SODIUM PHOSPHATE 10 MG/ML IJ SOLN
INTRAMUSCULAR | Status: DC | PRN
  Administered 2019-08-10: 10 mg via INTRAVENOUS

## 2019-08-10 MED ORDER — GLYCOPYRROLATE 0.2 MG/ML IJ SOLN
INTRAMUSCULAR | Status: DC | PRN
  Administered 2019-08-10: .2 mg via INTRAVENOUS

## 2019-08-10 MED ORDER — EPHEDRINE SULFATE 50 MG/ML IJ SOLN
INTRAMUSCULAR | Status: DC | PRN
  Administered 2019-08-10 (×5): 5 mg via INTRAVENOUS

## 2019-08-10 MED ORDER — OXYCODONE HCL 5 MG PO TABS: 5.0000 mg | ORAL_TABLET | Freq: Once | ORAL | Status: DC | PRN

## 2019-08-10 MED ORDER — ESMOLOL HCL 100 MG/10ML IV SOLN
INTRAVENOUS | Status: DC | PRN
  Administered 2019-08-10: 10 mg via INTRAVENOUS

## 2019-08-10 MED ORDER — ROCURONIUM BROMIDE 100 MG/10ML IV SOLN
INTRAVENOUS | Status: DC | PRN
  Administered 2019-08-10: 40 mg via INTRAVENOUS

## 2019-08-10 SURGICAL SUPPLY — 54 items
BAG DECANTER FOR FLEXI CONT (MISCELLANEOUS) ×2 IMPLANT
BLADE CLIPPER SURG (BLADE) ×1 IMPLANT
BLADE SURG SZ11 CARB STEEL (BLADE) ×2 IMPLANT
BOOT SUTURE AID YELLOW STND (SUTURE) ×2 IMPLANT
BRUSH SCRUB EZ  4% CHG (MISCELLANEOUS) ×1
BRUSH SCRUB EZ 4% CHG (MISCELLANEOUS) ×1 IMPLANT
CANISTER SUCT 1200ML W/VALVE (MISCELLANEOUS) ×2 IMPLANT
CHLORAPREP W/TINT 26 (MISCELLANEOUS) ×2 IMPLANT
CLIP SPRNG 6 S-JAW DBL (CLIP) ×1 IMPLANT
CLIP SPRNG 6MM S-JAW DBL (CLIP) ×2
COVER WAND RF STERILE (DRAPES) ×2 IMPLANT
DERMABOND ADVANCED (GAUZE/BANDAGES/DRESSINGS) ×1
DERMABOND ADVANCED .7 DNX12 (GAUZE/BANDAGES/DRESSINGS) ×1 IMPLANT
ELECT CAUTERY BLADE 6.4 (BLADE) ×2 IMPLANT
ELECT REM PT RETURN 9FT ADLT (ELECTROSURGICAL) ×2
ELECTRODE REM PT RTRN 9FT ADLT (ELECTROSURGICAL) ×1 IMPLANT
GEL ULTRASOUND 20GR AQUASONIC (MISCELLANEOUS) IMPLANT
GLOVE BIO SURGEON STRL SZ7 (GLOVE) ×5 IMPLANT
GLOVE INDICATOR 7.5 STRL GRN (GLOVE) ×2 IMPLANT
GOWN STRL REUS W/ TWL LRG LVL3 (GOWN DISPOSABLE) ×2 IMPLANT
GOWN STRL REUS W/ TWL XL LVL3 (GOWN DISPOSABLE) ×1 IMPLANT
GOWN STRL REUS W/TWL LRG LVL3 (GOWN DISPOSABLE) ×2
GOWN STRL REUS W/TWL XL LVL3 (GOWN DISPOSABLE) ×1
HEMOSTAT SURGICEL 2X3 (HEMOSTASIS) ×2 IMPLANT
IV NS 500ML (IV SOLUTION) ×1
IV NS 500ML BAXH (IV SOLUTION) ×1 IMPLANT
KIT TURNOVER KIT A (KITS) ×2 IMPLANT
LABEL OR SOLS (LABEL) ×2 IMPLANT
LOOP RED MAXI  1X406MM (MISCELLANEOUS) ×1
LOOP VESSEL MAXI 1X406 RED (MISCELLANEOUS) ×1 IMPLANT
LOOP VESSEL MINI 0.8X406 BLUE (MISCELLANEOUS) ×1 IMPLANT
LOOPS BLUE MINI 0.8X406MM (MISCELLANEOUS) ×1
NDL FILTER BLUNT 18X1 1/2 (NEEDLE) ×1 IMPLANT
NEEDLE FILTER BLUNT 18X 1/2SAF (NEEDLE) ×1
NEEDLE FILTER BLUNT 18X1 1/2 (NEEDLE) ×1 IMPLANT
NS IRRIG 500ML POUR BTL (IV SOLUTION) ×2 IMPLANT
PACK EXTREMITY ARMC (MISCELLANEOUS) ×2 IMPLANT
PAD PREP 24X41 OB/GYN DISP (PERSONAL CARE ITEMS) ×2 IMPLANT
SOLUTION CELL SAVER (CLIP) ×1 IMPLANT
STOCKINETTE 48X4 2 PLY STRL (GAUZE/BANDAGES/DRESSINGS) ×1 IMPLANT
STOCKINETTE STRL 4IN 9604848 (GAUZE/BANDAGES/DRESSINGS) ×2 IMPLANT
SUT MNCRL AB 4-0 PS2 18 (SUTURE) ×2 IMPLANT
SUT PROLENE 6 0 BV (SUTURE) ×8 IMPLANT
SUT SILK 2 0 (SUTURE) ×1
SUT SILK 2-0 18XBRD TIE 12 (SUTURE) ×1 IMPLANT
SUT SILK 3 0 (SUTURE) ×1
SUT SILK 3-0 18XBRD TIE 12 (SUTURE) ×1 IMPLANT
SUT SILK 4 0 (SUTURE) ×1
SUT SILK 4-0 18XBRD TIE 12 (SUTURE) ×1 IMPLANT
SUT VIC AB 3-0 SH 27 (SUTURE) ×1
SUT VIC AB 3-0 SH 27X BRD (SUTURE) ×1 IMPLANT
SYR 20ML LL LF (SYRINGE) ×2 IMPLANT
SYR 3ML LL SCALE MARK (SYRINGE) ×2 IMPLANT
SYR TB 1ML 27GX1/2 LL (SYRINGE) IMPLANT

## 2019-08-10 NOTE — Transfer of Care (Signed)
Immediate Anesthesia Transfer of Care Note  Patient: Darnel Mchan  Procedure(s) Performed: Left Upper Extremity AV Fistula creation (Left )  Patient Location: PACU  Anesthesia Type:General  Level of Consciousness: awake, alert , oriented and patient cooperative  Airway & Oxygen Therapy: Patient Spontanous Breathing and Patient connected to face mask oxygen  Post-op Assessment: Report given to RN and Post -op Vital signs reviewed and stable  Post vital signs: Reviewed and stable  Last Vitals:  Vitals Value Taken Time  BP 115/61 08/10/19 1052  Temp 36.3 C 08/10/19 1052  Pulse 91 08/10/19 1056  Resp 14 08/10/19 1056  SpO2 98 % 08/10/19 1056  Vitals shown include unvalidated device data.  Last Pain:  Vitals:   08/10/19 1052  TempSrc:   PainSc: Asleep      Patients Stated Pain Goal: 0 (50/09/38 1829)  Complications: No apparent anesthesia complications

## 2019-08-10 NOTE — Op Note (Signed)
Peter Pearson   OPERATIVE NOTE   PROCEDURE: Left brachiocephalic arteriovenous fistula placement  PRE-OPERATIVE DIAGNOSIS: 1.  ESRD        POST-OPERATIVE DIAGNOSIS: 1. ESRD       SURGEON: Peter Pain, MD  ASSISTANT(S): Hezzie Bump, PA-C  ANESTHESIA: general  ESTIMATED BLOOD LOSS: 10 cc  FINDING(S): Adequate cephalic vein for fistula creation  SPECIMEN(S):  none  INDICATIONS:   Peter Pearson is a 64 y.o. male who presents with renal failure in need of pemanent dialysis acces.  The patient is scheduled for left arm AVF placement.  The patient is aware the risks include but are not limited to: bleeding, infection, steal syndrome, nerve damage, ischemic monomelic neuropathy, failure to mature, and need for additional procedures.  The patient is aware of the risks of the procedure and elects to proceed forward. An assistant was present during the procedure to help facilitate the exposure and expedite the procedure.  DESCRIPTION: After full informed written consent was obtained from the patient, the patient was brought back to the operating room and placed supine upon the operating table.  Prior to induction, the patient received IV antibiotics. The assistant provided retraction and mobilization to help facilitate exposure and expedite the procedure throughout the entire procedure.  This included following suture, using retractors, and optimizing lighting.  After obtaining adequate anesthesia, the patient was then prepped and draped in the standard fashion for a left arm access procedure.  I made a curvilinear incision at the level of the antecubital fossa and dissected through the subcutaneous tissue and fascia to gain exposure of the brachial artery.  This was noted to be patent and adequate in size for fistula creation.  This was dissected out proximally and distally and prepared for control with vessel loops .  I then dissected out the cephalic vein.  This was  noted to be patent and adequate in size for fistula creation.  I then gave the patient 3000 units of intravenous heparin.  The vein was marked for orientation and the distal segment of the vein was ligated with a  2-0 silk, and the vein was transected.  I then instilled the heparinized saline into the vein and clamped it.  At this point, I reset my exposure of the brachial artery and pulled up control on the vessel loops.  I made an arteriotomy with a #11 blade, and then I extended the arteriotomy with a Potts scissor.  I injected heparinized saline proximal and distal to this arteriotomy.  The vein was then sewn to the artery in an end-to-side configuration with a running stitch of 6-0 Prolene.  Prior to completing this anastomosis, I allowed the vein and artery to backbleed.  There was no evidence of clot from any vessels.  I completed the anastomosis in the usual fashion and then released all vessel loops and clamps.  There was a palpable  thrill in the venous outflow, and there was a palpable pulse in the artery distal to the anastomosis.  At this point, I irrigated out the surgical wound.  Surgicel was placed. There was no further active bleeding.  The subcutaneous tissue was reapproximated with a running stitch of 3-0 Vicryl.  The skin was then closed with a 4-0 Monocryl suture.  The skin was then cleaned, dried, and reinforced with Dermabond.  The patient tolerated this procedure well and was taken to the recovery room in stable condition  COMPLICATIONS: None  CONDITION: Stable   Peter Pearson  08/10/2019, 10:34 AM  This note was created with Dragon Medical transcription system. Any errors in dictation are purely unintentional.

## 2019-08-10 NOTE — Progress Notes (Addendum)
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 Previously patient was followed by [facility] nephrology but requested second opinion. Patient has known severe renal insufficiency. Most recent BUNis 122with [name] creatinine of 9and EGFR of 6.Serum bicarbonate level is 17.Serum phosphorus was also quite high at 9.1. He also has significant proteinuria with most recent albumin to creatinine ratio of 4065. He also has evidence of anemia chronic kidney disease with most recent hemoglobin of8.3. In addition patient has additional complication in the form of secondary hyperparathyroidism with most recent PTH of 776. He was previously advised to pursue renal placement therapy but wanted to consider this [name] bit further. Patient complains of exertional shortness of breath associated with lower extremity swelling,nausea and anorexia.Denies having any chest pain,dizziness, light headedness,cough,fever or chills He has now agreed to undergo hemodialysis.Plan is for patient to get PermCath placed in [name].m. prior to initiation of dialysis.  The patient has had Permcath placed on 08/08/2019 and has undergone dialysis. Vascular surgery has been consulted for fistula placement. This has been planned for Wednesday. The patient will need to be set up for outpatient dialysis.   Assessment & Plan:   Active Problems:   ESRD (end  stage renal disease) ([place])   Essential hypertension   Anemia of chronic kidney failure, stage 5 (HCC)  ESRD 2/2 Alports disease with FSGS on bx:   S/p tunneled HD cath placement on 3/8 S/p L brachiocephalic AV fistula placement on 3/10 Has been started on dialysis Appreciate renal consult Pending outpt HD center placement  Essential Hypertension: Blood pressure is controlled ondiltiazem, HCTZ and ARB.  DM II: Glucoses will be followed FSBS and SSI. Will check HbA1c (6.3). Glucoses followed with FSBS and SSI.  Anemia of chronic kidney disease: H&H is stable. Monitor.  Morbid Obesity: Complicates all cares. Recommendation is for sensible weight loss to be guided by the patient's PCP.  DVT prophylaxis: SCD Code Status: full  Family Communication: none at bedside - daughter Disposition Plan:  . Patient came from: home            . Anticipated d/c place: home . Barriers to d/c OR conditions which need to be met to effect [name] safe d/c: pending establishment with outpatient dialysis center   Consultants:   Vascular  Renal  Procedures:  L brachiocephalic arteriovenous fistula placement 3/10 Placement of tunneled hemodialysis catheter via R IJ on 3/8  Antimicrobials:  Anti-infectives (From admission, onward)   Start     Dose/Rate Route Frequency Ordered Stop   08/09/19 0000  ceFAZolin (ANCEF) IVPB 1 g/50 mL premix  Status:  Discontinued    Note to Pharmacy: To be given in specials   1 g 100 mL/hr over 30 Minutes Intravenous  Once 08/08/19 1022 08/08/19 1048   08/08/19 0845  ceFAZolin (ANCEF) IVPB 1 g/50 mL premix     1 g 100 mL/hr over 30 Minutes Intravenous  Once 08/08/19 0844 08/08/19 1018     Subjective: No complaints  Objective: Vitals:   08/10/19 1545 08/10/19 1600 08/10/19 1611  08/10/19 1729  BP: 122/68 111/76 118/75 129/82  Pulse: (!) 103 (!) 109 (!) 103 96  Resp: (!) 26 (!) 22 (!) 26 20  Temp:    98.6 F (37 C)  TempSrc:    Oral  SpO2:    98%  Weight:       Height:        Intake/Output Summary (Last 24 hours) at 08/10/2019 1738 Last data filed at 08/10/2019 1611 Gross per 24 hour  Intake 720 ml  Output 1000 ml  Net -280 ml   Filed Weights   08/07/19 1100 08/08/19 0934  Weight: 125.2 kg 125.2 kg    Examination:  General exam: Appears calm and comfortable  Respiratory system: Clear to auscultation. Respiratory effort normal. Cardiovascular system: S1 & S2 heard, RRR. Gastrointestinal system: Abdomen is nondistended, soft and nontender.  Central nervous system: Alert and oriented. No focal neurological deficits. Extremities: moving all extremities Skin: No rashes, lesions or ulcers Psychiatry: Judgement and insight appear normal. Mood & affect appropriate.     Data Reviewed: I have personally reviewed following labs and imaging studies  CBC: Recent Labs  Lab 08/07/19 1240 08/08/19 0619 08/09/19 0411 08/10/19 0545  WBC 9.7 9.6 9.4 10.3  HGB 8.9* 8.6* 8.7* 9.1*  HCT 26.8* 26.4* 27.1* 27.3*  MCV 87.3 88.0 88.9 86.9  PLT 269 273 255 570   Basic Metabolic Panel: Recent Labs  Lab 08/07/19 1240 08/08/19 0619 08/08/19 1413 08/09/19 0411 08/09/19 1005 08/10/19 0545  NA 135 138  --  138  --  137  K 3.8 3.9  --  3.7  --  3.4*  CL 105 110  --  106  --  101  CO2 17* 14*  --  14*  --  26  GLUCOSE 88 114*  --  122*  --  124*  BUN 122* 134*  --  116*  --  76*  CREATININE 9.00* 9.79*  --  8.57*  --  6.75*  CALCIUM 7.5* 7.2*  --  7.8*  --  7.7*  MG 2.5*  --   --   --   --  2.2  PHOS 10.3*  --  8.1*  --  7.9*  --    GFR: Estimated Creatinine Clearance: 14.6 mL/min (Tionne Dayhoff) (by C-G formula based on SCr of 6.75 mg/dL (H)). Liver Function Tests: Recent Labs  Lab 08/07/19 1240  AST 11*  ALT 14  ALKPHOS 73  BILITOT 0.5  PROT 6.9  ALBUMIN 3.5   No results for input(s): LIPASE, AMYLASE in the last 168 hours. No results for input(s): AMMONIA in the last 168 hours. Coagulation Profile: Recent Labs  Lab 08/08/19 0619  08/10/19 0545  INR 1.2 1.1   Cardiac Enzymes: No results for input(s): CKTOTAL, CKMB, CKMBINDEX, TROPONINI in the last 168 hours. BNP (last 3 results) No results for input(s): PROBNP in the last 8760 hours. HbA1C: Recent Labs    08/08/19 0619  HGBA1C 6.3*   CBG: Recent Labs  Lab 08/09/19 1647 08/09/19 2115 08/10/19 0736 08/10/19 1123 08/10/19 1726  GLUCAP 164* 143* 107* 154* 207*   Lipid Profile: No results for input(s): CHOL, HDL, LDLCALC, TRIG, CHOLHDL, LDLDIRECT in the last 72 hours. Thyroid Function Tests: No results for input(s): TSH, T4TOTAL, FREET4, T3FREE, THYROIDAB in the last 72 hours. Anemia Panel: No results for input(s): VITAMINB12, FOLATE, FERRITIN, TIBC, IRON, RETICCTPCT in the last 72 hours. Sepsis Labs: No results for input(s): PROCALCITON, LATICACIDVEN in the last 168 hours.  Recent Results (  from the past 240 hour(s))  SARS CORONAVIRUS 2 (TAT 6-24 HRS) Nasopharyngeal Nasopharyngeal Swab     Status: None   Collection Time: 08/07/19  3:37 PM   Specimen: Nasopharyngeal Swab  Result Value Ref Range Status   SARS Coronavirus 2 NEGATIVE NEGATIVE Final    Comment: (NOTE) SARS-CoV-2 target nucleic acids are NOT DETECTED. The SARS-CoV-2 RNA is generally detectable in upper and lower respiratory specimens during the acute phase of infection. Negative results do not preclude SARS-CoV-2 infection, do not rule out co-infections with other pathogens, and should not be used as the sole basis for treatment or other patient management decisions. Negative results must be combined with clinical observations, patient history, and epidemiological information. The expected result is Negative. Fact Sheet for Patients: SugarRoll.be Fact Sheet for Healthcare Providers: https://www.woods-mathews.com/ This test is not yet approved or cleared by the [place] FDA and  has been authorized for detection and/or diagnosis of  SARS-CoV-2 by FDA under an Emergency Use Authorization (EUA). This EUA will remain  in effect (meaning this test can be used) for the duration of the COVID-19 declaration under Section 56 4(b)(1) of the Act, 21 U.S.C. section 360bbb-3(b)(1), unless the authorization is terminated or revoked sooner. Performed at [facility], [place] [address] [place], [place] [address]          Radiology Studies: DG Chest 2 View  Result Date: 08/09/2019 CLINICAL DATA:  Shortness of breath EXAM: CHEST - 2 VIEW COMPARISON:  08/07/2018 FINDINGS: Cardiac shadow is prominent but stable. New right jugular central line is noted in satisfactory position. No pneumothorax is seen. No focal infiltrate is seen. No bony abnormality is noted. IMPRESSION: No acute abnormality noted. Electronically Signed   By: Inez Catalina M.D.   On: 08/09/2019 15:55        Scheduled Meds: . allopurinol  100 mg Oral Daily  . Chlorhexidine Gluconate Cloth  6 each Topical Q0600  . diltiazem  240 mg Oral Daily  . epoetin (EPOGEN/PROCRIT) injection  4,000 Units Intravenous Q T,Th,Sa-HD  . insulin aspart  0-6 Units Subcutaneous TID WC  . irbesartan  300 mg Oral Daily  . pyridOXINE  50 mg Oral Daily  . rosuvastatin  10 mg Oral Daily  . sodium chloride flush  3 mL Intravenous Q12H  . tamsulosin  0.4 mg Oral Daily   Continuous Infusions: . sodium chloride 250 mL (08/10/19 0906)     LOS: 3 days    Time spent: over 30 min    Fayrene Helper, MD [facility]   To contact the attending provider between 7A-7P or the covering provider during after hours 7P-7A, please log into the web site [email or web address] and access using universal [place] password for that web site. If you do not have the password, please call the hospital operator.  08/10/2019, 5:38 PM

## 2019-08-10 NOTE — H&P (Signed)
[facility] History & Physical Update  The patient was interviewed and re-examined.  The patient's previous History and Physical has been reviewed and is unchanged.  There is no change in the plan of care. We plan to proceed with the scheduled procedure.  Leotis Pain, MD  08/10/2019, 8:54 AM

## 2019-08-10 NOTE — Anesthesia Postprocedure Evaluation (Signed)
Anesthesia Post Note  Patient: Peter Pearson  Procedure(s) Performed: Left Upper Extremity AV Fistula creation (Left )  Patient location during evaluation: PACU Anesthesia Type: General Level of consciousness: awake and alert Pain management: pain level controlled Vital Signs Assessment: post-procedure vital signs reviewed and stable Respiratory status: spontaneous breathing, nonlabored ventilation and respiratory function stable Cardiovascular status: blood pressure returned to baseline and stable Postop Assessment: no apparent nausea or vomiting Anesthetic complications: no     Last Vitals:  Vitals:   08/10/19 1311 08/10/19 1330  BP: 118/68 94/61  Pulse: 99 95  Resp: (!) 23 19  Temp: (!) 35.8 C   SpO2: 97%     Last Pain:  Vitals:   08/10/19 1311  TempSrc: Axillary  PainSc: 0-No pain                 Brett Canales Kourtnie Sachs

## 2019-08-10 NOTE — Anesthesia Procedure Notes (Signed)
Procedure Name: Intubation Performed by: Kelton Pillar, CRNA Pre-anesthesia Checklist: Patient identified, Emergency Drugs available, Suction available and Patient being monitored Patient Re-evaluated:Patient Re-evaluated prior to induction Oxygen Delivery Method: Circle system utilized Preoxygenation: Pre-oxygenation with 100% oxygen Induction Type: IV induction Ventilation: Mask ventilation without difficulty Laryngoscope Size: McGraph and 3 Grade View: Grade I Tube type: Oral Tube size: 7.5 mm Number of attempts: 1 Airway Equipment and Method: Stylet Placement Confirmation: ETT inserted through vocal cords under direct vision,  CO2 detector,  breath sounds checked- equal and bilateral and positive ETCO2 Secured at: 21 cm Tube secured with: Tape Dental Injury: Teeth and Oropharynx as per pre-operative assessment

## 2019-08-10 NOTE — Progress Notes (Signed)
[facility]  ROUNDING NOTE   Subjective:  Patient underwent successful left upper extremity AV fistula placement today. Tolerated the surgery quite well. Due for his third dialysis treatment today. Outpatient hemodialysis center placement pending.  Objective:  Vital signs in last 24 hours:  Temp:  [96.4 F (35.8 C)-99 F (37.2 C)] 96.4 F (35.8 C) (03/10 1311) Pulse Rate:  [70-100] 95 (03/10 1330) Resp:  [8-23] 19 (03/10 1330) BP: (94-138)/(54-81) 94/61 (03/10 1330) SpO2:  [91 %-100 %] 97 % (03/10 1311)  Weight change:  Filed Weights   08/07/19 1100 08/08/19 0934  Weight: 125.2 kg 125.2 kg    Intake/Output: I/O last 3 completed shifts: In: 480 [P.O.:480] Out: 0    Intake/Output this shift:  Total I/O In: 480 [P.O.:30; I.V.:450] Out: 0   Physical Exam: General: No acute distress  Head: Normocephalic, atraumatic. Moist oral mucosal membranes  Eyes: Anicteric  Neck: Supple, trachea midline  Lungs:  Clear to auscultation, normal effort  Heart: S1S2 no rubs  Abdomen:  Soft, nontender, bowel sounds present  Extremities: 1+ peripheral edema.  Neurologic: Awake, alert, following commands  Skin: No lesions  Access: Right internal jugular PermCath in place, left upper extremity AV fistula    Basic Metabolic Panel: Recent Labs  Lab 08/07/19 1240 08/07/19 1240 08/08/19 0619 08/08/19 1413 08/09/19 0411 08/09/19 1005 08/10/19 0545  NA 135  --  138  --  138  --  137  K 3.8  --  3.9  --  3.7  --  3.4*  CL 105  --  110  --  106  --  101  CO2 17*  --  14*  --  14*  --  26  GLUCOSE 88  --  114*  --  122*  --  124*  BUN 122*  --  134*  --  116*  --  76*  CREATININE 9.00*  --  9.79*  --  8.57*  --  6.75*  CALCIUM 7.5*   < > 7.2*  --  7.8*  --  7.7*  MG 2.5*  --   --   --   --   --  2.2  PHOS 10.3*  --   --  8.1*  --  7.9*  --    < > = values in this interval not displayed.    Liver Function Tests: Recent Labs  Lab 08/07/19 1240  AST 11*  ALT 14   ALKPHOS 73  BILITOT 0.5  PROT 6.9  ALBUMIN 3.5   No results for input(s): LIPASE, AMYLASE in the last 168 hours. No results for input(s): AMMONIA in the last 168 hours.  CBC: Recent Labs  Lab 08/07/19 1240 08/08/19 0619 08/09/19 0411 08/10/19 0545  WBC 9.7 9.6 9.4 10.3  HGB 8.9* 8.6* 8.7* 9.1*  HCT 26.8* 26.4* 27.1* 27.3*  MCV 87.3 88.0 88.9 86.9  PLT 269 273 255 238    Cardiac Enzymes: No results for input(s): CKTOTAL, CKMB, CKMBINDEX, TROPONINI in the last 168 hours.  BNP: Invalid input(s): POCBNP  CBG: Recent Labs  Lab 08/09/19 1647 08/09/19 2115 08/10/19 0736 08/10/19 1123  GLUCAP 164* 143* 107* 154*    Microbiology: Results for orders placed or performed during the hospital encounter of 08/07/19  SARS CORONAVIRUS 2 (TAT 6-24 HRS) Nasopharyngeal Nasopharyngeal Swab     Status: None   Collection Time: 08/07/19  3:37 PM   Specimen: Nasopharyngeal Swab  Result Value Ref Range Status   SARS Coronavirus 2 NEGATIVE NEGATIVE  Final    Comment: (NOTE) SARS-CoV-2 target nucleic acids are NOT DETECTED. The SARS-CoV-2 RNA is generally detectable in upper and lower respiratory specimens during the acute phase of infection. Negative results do not preclude SARS-CoV-2 infection, do not rule out co-infections with other pathogens, and should not be used as the sole basis for treatment or other patient management decisions. Negative results must be combined with clinical observations, patient history, and epidemiological information. The expected result is Negative. Fact Sheet for Patients: SugarRoll.be Fact Sheet for Healthcare Providers: https://www.woods-mathews.com/ This test is not yet approved or cleared by the [place] FDA and  has been authorized for detection and/or diagnosis of SARS-CoV-2 by FDA under an Emergency Use Authorization (EUA). This EUA will remain  in effect (meaning this test can be used) for the  duration of the COVID-19 declaration under Section 56 4(b)(1) of the Act, 21 U.S.C. section 360bbb-3(b)(1), unless the authorization is terminated or revoked sooner. Performed at [facility], [place] [address], [place], [place] [address]     Coagulation Studies: Recent Labs    08/08/19 0619 08/10/19 0545  LABPROT 14.7 14.0  INR 1.2 1.1    Urinalysis: No results for input(s): COLORURINE, LABSPEC, PHURINE, GLUCOSEU, HGBUR, BILIRUBINUR, KETONESUR, PROTEINUR, UROBILINOGEN, NITRITE, LEUKOCYTESUR in the last 72 hours.  Invalid input(s): APPERANCEUR    Imaging: DG Chest 2 View  Result Date: 08/09/2019 CLINICAL DATA:  Shortness of breath EXAM: CHEST - 2 VIEW COMPARISON:  08/07/2018 FINDINGS: Cardiac shadow is prominent but stable. New right jugular central line is noted in satisfactory position. No pneumothorax is seen. No focal infiltrate is seen. No bony abnormality is noted. IMPRESSION: No acute abnormality noted. Electronically Signed   By: Inez Catalina M.D.   On: 08/09/2019 15:55     Medications:   . sodium chloride 250 mL (08/10/19 0906)   . allopurinol  100 mg Oral Daily  . Chlorhexidine Gluconate Cloth  6 each Topical Q0600  . diltiazem  240 mg Oral Daily  . epoetin (EPOGEN/PROCRIT) injection  4,000 Units Intravenous Q T,Th,Sa-HD  . insulin aspart  0-6 Units Subcutaneous TID WC  . irbesartan  300 mg Oral Daily  . pyridOXINE  50 mg Oral Daily  . rosuvastatin  10 mg Oral Daily  . sodium chloride flush  3 mL Intravenous Q12H  . tamsulosin  0.4 mg Oral Daily   sodium chloride, acetaminophen **OR** acetaminophen, HYDROcodone-acetaminophen, HYDROmorphone (DILAUDID) injection, ondansetron (ZOFRAN) IV, ondansetron **OR** [DISCONTINUED] ondansetron (ZOFRAN) IV, sodium chloride flush  Assessment/ Plan:  64 y.o. 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  1.  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    Successful PermCath placement 08/08/2019.  First dialysis treatment 08/09/2019.  Left upper extremity AV fistula placed 08/10/2019. -Patient underwent left upper extremity AV fistula placement today.  Tolerated procedure well.  He had a good vein per vascular surgery.  Patient due for his third dialysis treatment today.  Outpatient hemodialysis center placement pending at the moment.  2.  Hypertension.  Continue diltiazem and irbesartan.  3.  Anemia of chronic kidney disease.  Hemoglobin currently 9.1.  Maintain the patient  on Epogen 4000 units IV with dialysis.  4.  Secondary hyperparathyroidism.  Phosphorus down to 7.9 yesterday.  Repeat serum phosphorus today.  Should continue to improve.   LOS: 3 Jnya Brossard 3/10/20212:09 PM

## 2019-08-10 NOTE — Anesthesia Preprocedure Evaluation (Addendum)
Anesthesia Evaluation  Patient identified by MRN, date of birth, ID band Patient awake    Reviewed: Allergy & Precautions, H&P , NPO status , Patient's Chart, lab work & pertinent test results  Airway Mallampati: IV  TM Distance: >3 FB Neck ROM: full   Comment: Mallampati view limited by patient effort with mouth opening Dental  (+) Teeth Intact   Pulmonary sleep apnea and Continuous Positive Airway Pressure Ventilation , former smoker,           Cardiovascular hypertension, (-) angina(-) Past MI and (-) Cardiac Stents (-) dysrhythmias  Rhythm:regular     Neuro/Psych negative neurological ROS  negative psych ROS   GI/Hepatic negative GI ROS, Neg liver ROS,   Endo/Other  Morbid obesity  Renal/GU ESRFRenal disease     Musculoskeletal   Abdominal   Peds  Hematology  (+) Blood dyscrasia (Hgb 9.1), anemia ,   Anesthesia Other Findings Past Medical History: No date: Chronic kidney disease No date: Hyperlipidemia No date: Hypertension No date: Sleep apnea  Past Surgical History: 08/08/2019: DIALYSIS/PERMA CATHETER INSERTION; N/A     Comment:  Procedure: DIALYSIS/PERMA CATHETER INSERTION;  Surgeon:               Algernon Huxley, MD;  Location: [facility];                Service: Cardiovascular;  Laterality: N/A;  BMI    Body Mass Index: 41.97 kg/m      Reproductive/Obstetrics negative OB ROS                           Anesthesia Physical Anesthesia Plan  ASA: III  Anesthesia Plan: General ETT   Post-op Pain Management:    Induction:   PONV Risk Score and Plan: Ondansetron, Dexamethasone, Midazolam and Treatment may vary due to age or medical condition  Airway Management Planned:   Additional Equipment:   Intra-op Plan:   Post-operative Plan:   Informed Consent: I have reviewed the patients History and Physical, chart, labs and discussed the procedure including the risks,  benefits and alternatives for the proposed anesthesia with the patient or authorized representative who has indicated his/her understanding and acceptance.     Dental Advisory Given  Plan Discussed with: Anesthesiologist  Anesthesia Plan Comments:        Anesthesia Quick Evaluation

## 2019-08-11 LAB — PHOSPHORUS: Phosphorus: 4.8 mg/dL — ABNORMAL HIGH (ref 2.5–4.6)

## 2019-08-11 LAB — COMPREHENSIVE METABOLIC PANEL
ALT: 11 U/L (ref 0–44)
AST: 15 U/L (ref 15–41)
Albumin: 3.4 g/dL — ABNORMAL LOW (ref 3.5–5.0)
Alkaline Phosphatase: 60 U/L (ref 38–126)
Anion gap: 13 (ref 5–15)
BUN: 63 mg/dL — ABNORMAL HIGH (ref 8–23)
CO2: 23 mmol/L (ref 22–32)
Calcium: 8 mg/dL — ABNORMAL LOW (ref 8.9–10.3)
Chloride: 100 mmol/L (ref 98–111)
Creatinine, Ser: 5.48 mg/dL — ABNORMAL HIGH (ref 0.61–1.24)
GFR calc Af Amer: 12 mL/min — ABNORMAL LOW (ref >60)
GFR calc non Af Amer: 10 mL/min — ABNORMAL LOW (ref >60)
Glucose, Bld: 199 mg/dL — ABNORMAL HIGH (ref 70–99)
Potassium: 4 mmol/L (ref 3.5–5.1)
Sodium: 136 mmol/L (ref 135–145)
Total Bilirubin: 0.4 mg/dL (ref 0.3–1.2)
Total Protein: 6.8 g/dL (ref 6.5–8.1)

## 2019-08-11 LAB — CBC WITH DIFFERENTIAL/PLATELET
Abs Immature Granulocytes: 0.12 10*3/uL — ABNORMAL HIGH (ref 0.00–0.07)
Basophils Absolute: 0 10*3/uL (ref 0.0–0.1)
Basophils Relative: 0 %
Eosinophils Absolute: 0 10*3/uL (ref 0.0–0.5)
Eosinophils Relative: 0 %
HCT: 27.4 % — ABNORMAL LOW (ref 39.0–52.0)
Hemoglobin: 8.8 g/dL — ABNORMAL LOW (ref 13.0–17.0)
Immature Granulocytes: 1 %
Lymphocytes Relative: 10 %
Lymphs Abs: 1.2 10*3/uL (ref 0.7–4.0)
MCH: 28.2 pg (ref 26.0–34.0)
MCHC: 32.1 g/dL (ref 30.0–36.0)
MCV: 87.8 fL (ref 80.0–100.0)
Monocytes Absolute: 0.4 10*3/uL (ref 0.1–1.0)
Monocytes Relative: 3 %
Neutro Abs: 10.2 10*3/uL — ABNORMAL HIGH (ref 1.7–7.7)
Neutrophils Relative %: 86 %
Platelets: 229 10*3/uL (ref 150–400)
RBC: 3.12 MIL/uL — ABNORMAL LOW (ref 4.22–5.81)
RDW: 13.6 % (ref 11.5–15.5)
WBC: 11.8 10*3/uL — ABNORMAL HIGH (ref 4.0–10.5)
nRBC: 0 % (ref 0.0–0.2)

## 2019-08-11 LAB — QUANTIFERON-TB GOLD PLUS (RQFGPL)
QuantiFERON Mitogen Value: 2.57 IU/mL
QuantiFERON Nil Value: 0.09 IU/mL
QuantiFERON TB1 Ag Value: 0.05 IU/mL
QuantiFERON TB2 Ag Value: 0.06 IU/mL

## 2019-08-11 LAB — QUANTIFERON-TB GOLD PLUS: QuantiFERON-TB Gold Plus: NEGATIVE

## 2019-08-11 LAB — GLUCOSE, CAPILLARY
Glucose-Capillary: 161 mg/dL — ABNORMAL HIGH (ref 70–99)
Glucose-Capillary: 128 mg/dL — ABNORMAL HIGH (ref 70–99)

## 2019-08-11 LAB — MAGNESIUM: Magnesium: 2.1 mg/dL (ref 1.7–2.4)

## 2019-08-11 MED ORDER — MIDODRINE HCL 5 MG PO TABS: ORAL_TABLET | ORAL | 0 refills | Status: AC

## 2019-08-11 MED ORDER — MIDODRINE HCL 5 MG PO TABS
10.0000 mg | ORAL_TABLET | Freq: Once | ORAL | Status: AC
  Administered 2019-08-11: 10 mg via ORAL
  Filled 2019-08-11: qty 2

## 2019-08-11 NOTE — Progress Notes (Signed)
Hd stqarted

## 2019-08-11 NOTE — Discharge Summary (Signed)
Physician Discharge Summary  Zebedee Segundo JHE:[id number] DOB: January 04, 1956 DOA: 08/07/2019  PCP: System, Pcp Not In  Admit date: 08/07/2019 Discharge date: 08/11/2019  Time spent: 40 minutes  Recommendations for Outpatient Follow-up:  1. Follow outpatient CBC/CMP 2. Follow blood pressures outpatient - BP meds discontined 3. Discharged with midodrine to take with dialysis  4. Follow with nephrology and outpatient dialysis  5. 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 Previously patient was followed by [facility] nephrology but requested second opinion. Patient has known severe renal insufficiency. Most recent BUNis 122with Kristoph Sattler creatinine of 9and EGFR of 6.Serum bicarbonate level is 17.Serum phosphorus was also quite high at 9.1. He also has significant proteinuria with most recent albumin to creatinine ratio of 4065. He also has evidence of anemia chronic kidney disease with most recent hemoglobin of8.3. In addition patient has additional complication in the form of secondary hyperparathyroidism with most recent PTH of 776. He  was previously advised to pursue renal placement therapy but wanted to consider this Ran Tullis bit further. Patient complains of exertional shortness of breath associated with lower extremity swelling,nausea and anorexia.Denies having any chest pain,dizziness, light headedness,cough,fever or chills He has now agreed to undergo hemodialysis.Plan is for patient to get PermCath placed in [place]. prior to initiation of dialysis.  The patient has had Permcath placedon 3/8/2021and has undergone dialysis. Vascular surgery placed L brachiocephalic AV fistula.  Dialysis was initiated.  He was discharged on 3/11 with plans to f/u Saturday to start outpatient dialysis.  See below for additional details  Hospital Course:  ESRD 2/2 Alports disease with FSGS on bx:   S/p tunneled HD cath placement on 3/8 S/p L brachiocephalic AV fistula placement on 3/10 Has been started on dialysis -> outpatient HD set up at [place] Hypertension: stop antihypertensives due to soft BP - start midodrine on dialysis days.  DM II: Glucoses will be followed FSBS and SSI. Will check HbA1c (6.3).Glucoses followed with FSBS and SSI. Follow outpatient  Anemia of chronic kidney disease: H&H is stable. Monitor.  Morbid Obesity: Complicates all cares. Recommendation is for sensible weight loss to be guided by the patient's PCP.  Procedures: L brachiocephalic arteriovenous fistula placement 3/10 Placement of tunneled hemodialysis catheter via R IJ on 3/8   Consultations:  Renal  vascular  Discharge Exam: Vitals:   08/11/19 1300 08/11/19 1335  BP: 116/68 130/66  Pulse: 92 70  Resp: 18 16  Temp:  97.9 F (36.6 C)  SpO2:  93%    Seen on dialysis No complaints Discussed d/c plan with pt and daughter  General: No acute distress. Cardiovascular: Heart sounds show Carmaleta Youngers regular rate, and rhythm.  Lungs: Clear to auscultation bilaterally  Abdomen: Soft, nontender, nondistended  Neurological:  Alert and oriented 3. Moves all extremities 4 with equal strength. Cranial nerves II through XII grossly intact. Skin: Warm and dry. No rashes or lesions. Extremities: No clubbing or cyanosis. No edema.   Discharge Instructions   Discharge Instructions    Call MD for:  difficulty breathing, headache or visual disturbances   Complete by: As directed    Call MD for:  extreme fatigue   Complete by: As directed    Call MD for:  hives   Complete by: As directed    Call MD for:  persistant dizziness or light-headedness   Complete by: As directed    Call MD for:  persistant nausea and vomiting   Complete by: As directed    Call MD for:  redness, tenderness, or signs of infection (pain, swelling, redness, odor or green/yellow discharge around incision site)   Complete by: As directed    Call MD for:  severe uncontrolled pain   Complete by: As directed    Call MD for:  temperature >100.4   Complete by: As directed    Diet renal 60/70-07-04-1198   Complete by: As directed    Discharge instructions   Complete by: As directed    You were seen for the initiation of dialysis.  You had Rashaun Curl tunneled dialysis catheter placed as well as Pragya Lofaso fistula in your left arm.  Continue to follow up with dialysis on Saturday as planned.  Follow up with nephrology as an outpatient.  Your blood pressures were low today, we've stopped your blood pressure medications and will prescribe midodrine which you should take on dialysis days to help keep your blood pressure up.  Follow up your diabetes outpatient.  Return for new, recurrent, or worsening symptoms.  Please ask your PCP to request records from this hospitalization so they know what was done and what the next steps will be.   Increase activity slowly   Complete by: As directed      Allergies as of 08/11/2019   No Known Allergies     Medication List    STOP taking these medications   diltiazem 240 MG 24 hr capsule Commonly known as: DILACOR XR    telmisartan-hydrochlorothiazide 80-12.5 MG tablet Commonly known as: MICARDIS HCT     TAKE these medications   allopurinol 100 MG tablet Commonly known as: ZYLOPRIM Take 100 mg by mouth daily.   midodrine 5 MG tablet Commonly known as: PROAMATINE Take 5 mg of midodrine on dialysis days (Tuesday, Thursday, and Saturday)   pyridOXINE 50 MG tablet Commonly known as: VITAMIN B-6 Take 50 mg by mouth daily.   rosuvastatin 20 MG tablet Commonly known as: CRESTOR Take 10 mg by mouth daily.   tamsulosin 0.4 MG Caps capsule Commonly known as: FLOMAX Take 0.4 mg by mouth daily.      No Known Allergies Follow-up Information    Algernon Huxley, MD Follow up in 1 month(s).   Specialties: Vascular Surgery, Radiology, Interventional Cardiology Why: First post-op visit. Can see Dew or Arna Medici. Will need left upper extremity HDA with visit.  Contact information: [place] [address] [phone]            The results of significant diagnostics from this hospitalization (including imaging, microbiology, ancillary and laboratory) are listed below for reference.    Significant Diagnostic Studies: DG Chest 2 View  Result Date: 08/09/2019 CLINICAL DATA:  Shortness of breath EXAM: CHEST - 2 VIEW COMPARISON:  08/07/2018 FINDINGS: Cardiac shadow  is prominent but stable. New right jugular central line is noted in satisfactory position. No pneumothorax is seen. No focal infiltrate is seen. No bony abnormality is noted. IMPRESSION: No acute abnormality noted. Electronically Signed   By: Inez Catalina M.D.   On: 08/09/2019 15:55   PERIPHERAL VASCULAR CATHETERIZATION  Result Date: 08/08/2019 See op note  DG Chest Port 1 View  Result Date: 08/07/2019 CLINICAL DATA:  Shortness of breath for Nyiesha Beever month, denies chest pain and cough EXAM: PORTABLE CHEST 1 VIEW COMPARISON:  Radiograph 06/08/2018, 10/12/2000 FINDINGS: Slightly bulbous configuration of the right main pulmonary artery, could  reflect some right hilar adenopathy or pulmonary arterial congestion. No consolidative opacity is seen. Lung volumes are low with hazy interstitial changes and cephalized vascular markings. Cardiac silhouette is upper limits normal. No acute osseous or soft tissue abnormality. Telemetry leads overlie the chest. IMPRESSION: Features of central vascular congestion. Convex appearance of the right main pulmonary artery/hilum, nonspecific and possibly artifactual but could reflect hilar adenopathy, consider CT imaging. Electronically Signed   By: Lovena Le M.D.   On: 08/07/2019 15:16  discussed rec to f/u imaging findings outpatient with pcp  Microbiology: Recent Results (from the past 240 hour(s))  SARS CORONAVIRUS 2 (TAT 6-24 HRS) Nasopharyngeal Nasopharyngeal Swab     Status: None   Collection Time: 08/07/19  3:37 PM   Specimen: Nasopharyngeal Swab  Result Value Ref Range Status   SARS Coronavirus 2 NEGATIVE NEGATIVE Final    Comment: (NOTE) SARS-CoV-2 target nucleic acids are NOT DETECTED. The SARS-CoV-2 RNA is generally detectable in upper and lower respiratory specimens during the acute phase of infection. Negative results do not preclude SARS-CoV-2 infection, do not rule out co-infections with other pathogens, and should not be used as the sole basis for treatment or other patient management decisions. Negative results must be combined with clinical observations, patient history, and epidemiological information. The expected result is Negative. Fact Sheet for Patients: SugarRoll.be Fact Sheet for Healthcare Providers: https://www.woods-mathews.com/ This test is not yet approved or cleared by the [place] FDA and  has been authorized for detection and/or diagnosis of SARS-CoV-2 by FDA under an Emergency Use Authorization (EUA). This EUA will remain  in effect (meaning this test can be used) for the duration of the COVID-19 declaration under  Section 56 4(b)(1) of the Act, 21 U.S.C. section 360bbb-3(b)(1), unless the authorization is terminated or revoked sooner. Performed at [facility], [place] [address] [place], [place] [address]      Labs: Basic Metabolic Panel: Recent Labs  Lab 08/07/19 1240 08/08/19 0619 08/08/19 1413 08/09/19 0411 08/09/19 1005 08/10/19 0545 08/11/19 0410  NA 135 138  --  138  --  137 136  K 3.8 3.9  --  3.7  --  3.4* 4.0  CL 105 110  --  106  --  101 100  CO2 17* 14*  --  14*  --  26 23  GLUCOSE 88 114*  --  122*  --  124* 199*  BUN 122* 134*  --  116*  --  76* 63*  CREATININE 9.00* 9.79*  --  8.57*  --  6.75* 5.48*  CALCIUM 7.5* 7.2*  --  7.8*  --  7.7* 8.0*  MG 2.5*  --   --   --   --  2.2 2.1  PHOS 10.3*  --  8.1*  --  7.9*  --  4.8*   Liver Function Tests: Recent Labs  Lab 08/07/19 1240  08/11/19 0410  AST 11* 15  ALT 14 11  ALKPHOS 73 60  BILITOT 0.5 0.4  PROT 6.9 6.8  ALBUMIN 3.5 3.4*   No results for input(s): LIPASE, AMYLASE in the last 168 hours. No results for input(s): AMMONIA in the last 168 hours. CBC: Recent Labs  Lab 08/07/19 1240 08/08/19 0619 08/09/19 0411 08/10/19 0545 08/11/19 0410  WBC 9.7 9.6 9.4 10.3 11.8*  NEUTROABS  --   --   --   --  10.2*  HGB 8.9* 8.6* 8.7* 9.1* 8.8*  HCT 26.8* 26.4* 27.1* 27.3* 27.4*  MCV 87.3 88.0 88.9 86.9 87.8  PLT 269 273 255 238 229   Cardiac Enzymes: No results for input(s): CKTOTAL, CKMB, CKMBINDEX, TROPONINI in the last 168 hours. BNP: BNP (last 3 results) No results for input(s): BNP in the last 8760 hours.  ProBNP (last 3 results) No results for input(s): PROBNP in the last 8760 hours.  CBG: Recent Labs  Lab 08/10/19 1123 08/10/19 1726 08/10/19 2134 08/11/19 0754 08/11/19 1331  GLUCAP 154* 207* 343* 161* 128*       Signed:  Fayrene Helper MD.  [facility] 08/11/2019, 1:53 PM

## 2019-08-11 NOTE — Progress Notes (Signed)
Peter Pearson to be D/C'd home with daughter per MD order.  Discussed prescriptions and follow up appointments with the patient. Prescriptions given to patient, medication list explained in detail. Pt verbalized understanding.  Allergies as of 08/11/2019   No Known Allergies      Medication List     STOP taking these medications    diltiazem 240 MG 24 hr capsule Commonly known as: DILACOR XR   telmisartan-hydrochlorothiazide 80-12.5 MG tablet Commonly known as: MICARDIS HCT       TAKE these medications    allopurinol 100 MG tablet Commonly known as: ZYLOPRIM Take 100 mg by mouth daily.   midodrine 5 MG tablet Commonly known as: PROAMATINE Take 5 mg of midodrine on dialysis days (Tuesday, Thursday, and Saturday)   pyridOXINE 50 MG tablet Commonly known as: VITAMIN B-6 Take 50 mg by mouth daily.   rosuvastatin 20 MG tablet Commonly known as: CRESTOR Take 10 mg by mouth daily.   tamsulosin 0.4 MG Caps capsule Commonly known as: FLOMAX Take 0.4 mg by mouth daily.        Vitals:   08/11/19 1300 08/11/19 1335  BP: 116/68 130/66  Pulse: 92 70  Resp: 18 16  Temp:  97.9 F (36.6 C)  SpO2:  93%    Skin clean, dry and intact without evidence of skin break down, no evidence of skin tears noted. IV catheter discontinued intact. Site without signs and symptoms of complications. Dressing and pressure applied. Pt denies pain at this time. No complaints noted.  An After Visit Summary was printed and given to the patient. Patient escorted via [place], and D/C home via private auto.  Peter Pearson

## 2019-08-11 NOTE — Discharge Instructions (Addendum)
Vascular Surgery Discharge Instructions  1) Dermabond (the glue covering your incision will fall off on its own in the next one to two weeks. Please do not pull it off).  2) Keep your incision clean and dry. Gently clean with soap and water. Gently pat dry.

## 2019-08-11 NOTE — Progress Notes (Signed)
Patient has been accepted at [place] 12:00pm. Patient can start on Saturday 3/13 at 11:00am. Patient is aware.

## 2019-08-11 NOTE — Progress Notes (Signed)
[facility]  ROUNDING NOTE   Subjective:  Pt seen and evaluated during dialysis.  Tolerating well. Outpt hemodialysis seat has been secured. Pt in good spirits.  Doing much better.   Objective:  Vital signs in last 24 hours:  Temp:  [96.4 F (35.8 C)-98.6 F (37 C)] 97.9 F (36.6 C) (03/11 0920) Pulse Rate:  [64-109] 64 (03/11 1045) Resp:  [8-174] 22 (03/11 1045) BP: (94-130)/(50-94) 106/55 (03/11 1030) SpO2:  [91 %-98 %] 96 % (03/11 0920)  Weight change:  Filed Weights   08/07/19 1100 08/08/19 0934  Weight: 125.2 kg 125.2 kg    Intake/Output: I/O last 3 completed shifts: In: 483 [P.O.:30; I.V.:453] Out: 1000 [Other:1000]   Intake/Output this shift:  No intake/output data recorded.  Physical Exam: General: No acute distress  Head: Normocephalic, atraumatic. Moist oral mucosal membranes  Eyes: Anicteric  Neck: Supple, trachea midline  Lungs:  Clear to auscultation, normal effort  Heart: S1S2 no rubs  Abdomen:  Soft, nontender, bowel sounds present  Extremities: 1+ peripheral edema.  Neurologic: Awake, alert, following commands  Skin: No lesions  Access: Right internal jugular PermCath in place, left upper extremity AV fistula    Basic Metabolic Panel: Recent Labs  Lab 08/07/19 1240 08/07/19 1240 08/08/19 0619 08/08/19 0619 08/08/19 1413 08/09/19 0411 08/09/19 1005 08/10/19 0545 08/11/19 0410  NA 135  --  138  --   --  138  --  137 136  K 3.8  --  3.9  --   --  3.7  --  3.4* 4.0  CL 105  --  110  --   --  106  --  101 100  CO2 17*  --  14*  --   --  14*  --  26 23  GLUCOSE 88  --  114*  --   --  122*  --  124* 199*  BUN 122*  --  134*  --   --  116*  --  76* 63*  CREATININE 9.00*  --  9.79*  --   --  8.57*  --  6.75* 5.48*  CALCIUM 7.5*   < > 7.2*   < >  --  7.8*  --  7.7* 8.0*  MG 2.5*  --   --   --   --   --   --  2.2 2.1  PHOS 10.3*  --   --   --  8.1*  --  7.9*  --  4.8*   < > = values in this interval not displayed.    Liver  Function Tests: Recent Labs  Lab 08/07/19 1240 08/11/19 0410  AST 11* 15  ALT 14 11  ALKPHOS 73 60  BILITOT 0.5 0.4  PROT 6.9 6.8  ALBUMIN 3.5 3.4*   No results for input(s): LIPASE, AMYLASE in the last 168 hours. No results for input(s): AMMONIA in the last 168 hours.  CBC: Recent Labs  Lab 08/07/19 1240 08/08/19 0619 08/09/19 0411 08/10/19 0545 08/11/19 0410  WBC 9.7 9.6 9.4 10.3 11.8*  NEUTROABS  --   --   --   --  10.2*  HGB 8.9* 8.6* 8.7* 9.1* 8.8*  HCT 26.8* 26.4* 27.1* 27.3* 27.4*  MCV 87.3 88.0 88.9 86.9 87.8  PLT 269 273 255 238 229    Cardiac Enzymes: No results for input(s): CKTOTAL, CKMB, CKMBINDEX, TROPONINI in the last 168 hours.  BNP: Invalid input(s): POCBNP  CBG: Recent Labs  Lab 08/10/19 0736 08/10/19 1123  08/10/19 1726 08/10/19 2134 08/11/19 0754  GLUCAP 107* 154* 207* 343* 161*    Microbiology: Results for orders placed or performed during the hospital encounter of 08/07/19  SARS CORONAVIRUS 2 (TAT 6-24 HRS) Nasopharyngeal Nasopharyngeal Swab     Status: None   Collection Time: 08/07/19  3:37 PM   Specimen: Nasopharyngeal Swab  Result Value Ref Range Status   SARS Coronavirus 2 NEGATIVE NEGATIVE Final    Comment: (NOTE) SARS-CoV-2 target nucleic acids are NOT DETECTED. The SARS-CoV-2 RNA is generally detectable in upper and lower respiratory specimens during the acute phase of infection. Negative results do not preclude SARS-CoV-2 infection, do not rule out co-infections with other pathogens, and should not be used as the sole basis for treatment or other patient management decisions. Negative results must be combined with clinical observations, patient history, and epidemiological information. The expected result is Negative. Fact Sheet for Patients: SugarRoll.be Fact Sheet for Healthcare Providers: https://www.woods-mathews.com/ This test is not yet approved or cleared by the [place] FDA and  has been authorized for detection and/or diagnosis of SARS-CoV-2 by FDA under an Emergency Use Authorization (EUA). This EUA will remain  in effect (meaning this test can be used) for the duration of the COVID-19 declaration under Section 56 4(b)(1) of the Act, 21 U.S.C. section 360bbb-3(b)(1), unless the authorization is terminated or revoked sooner. Performed at [facility], [place] [address] [place], [place] [address]     Coagulation Studies: Recent Labs    08/10/19 0545  LABPROT 14.0  INR 1.1    Urinalysis: No results for input(s): COLORURINE, LABSPEC, PHURINE, GLUCOSEU, HGBUR, BILIRUBINUR, KETONESUR, PROTEINUR, UROBILINOGEN, NITRITE, LEUKOCYTESUR in the last 72 hours.  Invalid input(s): APPERANCEUR    Imaging: DG Chest 2 View  Result Date: 08/09/2019 CLINICAL DATA:  Shortness of breath EXAM: CHEST - 2 VIEW COMPARISON:  08/07/2018 FINDINGS: Cardiac shadow is prominent but stable. New right jugular central line is noted in satisfactory position. No pneumothorax is seen. No focal infiltrate is seen. No bony abnormality is noted. IMPRESSION: No acute abnormality noted. Electronically Signed   By: Inez Catalina M.D.   On: 08/09/2019 15:55     Medications:   . sodium chloride 250 mL (08/10/19 0906)   . allopurinol  100 mg Oral Daily  . Chlorhexidine Gluconate Cloth  6 each Topical Q0600  . diltiazem  240 mg Oral Daily  . epoetin (EPOGEN/PROCRIT) injection  4,000 Units Intravenous Q T,Th,Sa-HD  . insulin aspart  0-6 Units Subcutaneous TID AC & HS  . irbesartan  300 mg Oral Daily  . pyridOXINE  50 mg Oral Daily  . rosuvastatin  10 mg Oral Daily  . sodium chloride flush  3 mL Intravenous Q12H  . tamsulosin  0.4 mg Oral Daily   sodium chloride, acetaminophen **OR** acetaminophen, HYDROcodone-acetaminophen, HYDROmorphone (DILAUDID) injection, ondansetron (ZOFRAN) IV, ondansetron **OR** [DISCONTINUED] ondansetron (ZOFRAN) IV, sodium chloride  flush  Assessment/ Plan:  64 y.o. 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  1.  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    Successful PermCath placement 08/08/2019.  First dialysis treatment 08/09/2019.  Left upper extremity  AV fistula placed 08/10/2019. -Patient seen and evaluated during fourth dialysis treatment.  Tolerating well.  Outpatient hemodialysis seat has been secured at [facility].  He is scheduled for his first outpatient dialysis on Saturday.  2.  Hypertension.  Blood pressure actually low now.  We will discontinue diltiazem and irbesartan.  3.  Anemia of chronic kidney disease.  Hemoglobin 8.8.  Maintain the patient on Epogen 4000 IV with dialysis on TTS.  4.  Secondary hyperparathyroidism.  Phosphorus markedly improved and now down to 4.8.  Continue to monitor trend.   LOS: 4 Peter Pearson 3/11/202110:48 AM

## 2019-08-11 NOTE — Progress Notes (Signed)
This note also relates to the following rows which could not be included: Pulse Rate - Cannot attach notes to unvalidated device data Resp - Cannot attach notes to unvalidated device data  Hd completed  

## 2019-08-11 NOTE — Progress Notes (Signed)
[facility] Daily Progress Note   Subjective: 1 Day Post-Op: Left brachiocephalic arteriovenous fistula placement  Seen in dialysis. No issues overnight. Minimal incisional pain.   Objective: Vitals:   08/11/19 0930 08/11/19 0945 08/11/19 1000 08/11/19 1015  BP: (!) 99/59 (!) 95/50 (!) 108/58 109/60  Pulse: 80 86 77 77  Resp: (!) 25 (!) 174 (!) 24 (!) 22  Temp:      TempSrc:      SpO2:      Weight:      Height:        Intake/Output Summary (Last 24 hours) at 08/11/2019 1033 Last data filed at 08/10/2019 2051 Gross per 24 hour  Intake 483 ml  Output 1000 ml  Net -517 ml   Physical Exam: A&Ox3, NAD Chest:   Right Permcath: Functioning, intact, clean and dry. No swelling or infection noted. CV: RRR Pulmonary: CTA Bilaterally Abdomen: Soft, Nontender, Nondistended Vascular:  Left Upper Extremity:    Incision: Clean, dry and intact. Dermabond intact.    2+ radial pulse. Minimal edema.    Laboratory: CBC    Component Value Date/Time   WBC 11.8 (H) 08/11/2019 0410   HGB 8.8 (L) 08/11/2019 0410   HCT 27.4 (L) 08/11/2019 0410   PLT 229 08/11/2019 0410   BMET    Component Value Date/Time   NA 136 08/11/2019 0410   K 4.0 08/11/2019 0410   CL 100 08/11/2019 0410   CO2 23 08/11/2019 0410   GLUCOSE 199 (H) 08/11/2019 0410   BUN 63 (H) 08/11/2019 0410   CREATININE 5.48 (H) 08/11/2019 0410   CALCIUM 8.0 (L) 08/11/2019 0410   GFRNONAA 10 (L) 08/11/2019 0410   GFRAA 12 (L) 08/11/2019 0410   Assessment/Planning: The patient is a 64 year old male who presents with renal failure s/p left brachiocephalic arteriovenous fistula placement - POD#1  1) successful creation of a left brachiocephalic AV fistula.  Will see the patient in our clinic in one month to undergo a HDA / assess maturity of the fistula.  2) patient seen and examined during dialysis.  PermCath seems to be functioning well. 3) from a vascular standpoint, the patient can be discharged  when medically stable.  Discussed with Dr. Ellis Parents Santiel Topper PA-C 08/11/2019 10:33 AM

## 2019-08-23 ENCOUNTER — Inpatient Hospital Stay
Admission: EM | Admit: 2019-08-23 | Discharge: 2019-08-27 | DRG: 177 | Disposition: A | Discharge status: Home / Self Care | Payer: PRIVATE HEALTH INSURANCE | Attending: Internal Medicine | Admitting: Internal Medicine

## 2019-08-23 ENCOUNTER — Other Ambulatory Visit: Payer: Self-pay

## 2019-08-23 ENCOUNTER — Emergency Department: Payer: PRIVATE HEALTH INSURANCE

## 2019-08-23 DIAGNOSIS — E1122 Type 2 diabetes mellitus with diabetic chronic kidney disease: Secondary | ICD-10-CM | POA: Diagnosis present

## 2019-08-23 DIAGNOSIS — R778 Other specified abnormalities of plasma proteins: Secondary | ICD-10-CM | POA: Diagnosis not present

## 2019-08-23 DIAGNOSIS — Q8781 Alport syndrome: Secondary | ICD-10-CM

## 2019-08-23 DIAGNOSIS — R000 Tachycardia, unspecified: Secondary | ICD-10-CM | POA: Diagnosis present

## 2019-08-23 DIAGNOSIS — Z992 Dependence on renal dialysis: Secondary | ICD-10-CM

## 2019-08-23 DIAGNOSIS — R05 Cough: Secondary | ICD-10-CM

## 2019-08-23 DIAGNOSIS — Z880 Allergy status to penicillin: Secondary | ICD-10-CM

## 2019-08-23 DIAGNOSIS — I120 Hypertensive chronic kidney disease with stage 5 chronic kidney disease or end stage renal disease: Secondary | ICD-10-CM | POA: Diagnosis present

## 2019-08-23 DIAGNOSIS — N186 End stage renal disease: Secondary | ICD-10-CM

## 2019-08-23 DIAGNOSIS — Z6841 Body Mass Index (BMI) 40.0 and over, adult: Secondary | ICD-10-CM

## 2019-08-23 DIAGNOSIS — D7582 Heparin induced thrombocytopenia (HIT): Secondary | ICD-10-CM | POA: Diagnosis present

## 2019-08-23 DIAGNOSIS — H9190 Unspecified hearing loss, unspecified ear: Secondary | ICD-10-CM | POA: Diagnosis present

## 2019-08-23 DIAGNOSIS — M109 Gout, unspecified: Secondary | ICD-10-CM | POA: Diagnosis present

## 2019-08-23 DIAGNOSIS — I248 Other forms of acute ischemic heart disease: Secondary | ICD-10-CM | POA: Diagnosis present

## 2019-08-23 DIAGNOSIS — Z79899 Other long term (current) drug therapy: Secondary | ICD-10-CM

## 2019-08-23 DIAGNOSIS — R059 Cough, unspecified: Secondary | ICD-10-CM

## 2019-08-23 DIAGNOSIS — U071 COVID-19: Secondary | ICD-10-CM | POA: Diagnosis not present

## 2019-08-23 DIAGNOSIS — E669 Obesity, unspecified: Secondary | ICD-10-CM | POA: Diagnosis present

## 2019-08-23 DIAGNOSIS — D631 Anemia in chronic kidney disease: Secondary | ICD-10-CM | POA: Diagnosis present

## 2019-08-23 DIAGNOSIS — N400 Enlarged prostate without lower urinary tract symptoms: Secondary | ICD-10-CM | POA: Diagnosis present

## 2019-08-23 DIAGNOSIS — Z8249 Family history of ischemic heart disease and other diseases of the circulatory system: Secondary | ICD-10-CM

## 2019-08-23 DIAGNOSIS — G4733 Obstructive sleep apnea (adult) (pediatric): Secondary | ICD-10-CM | POA: Diagnosis present

## 2019-08-23 DIAGNOSIS — E785 Hyperlipidemia, unspecified: Secondary | ICD-10-CM | POA: Diagnosis present

## 2019-08-23 DIAGNOSIS — D75829 Heparin-induced thrombocytopenia, unspecified: Secondary | ICD-10-CM

## 2019-08-23 DIAGNOSIS — R7989 Other specified abnormal findings of blood chemistry: Secondary | ICD-10-CM

## 2019-08-23 DIAGNOSIS — N2581 Secondary hyperparathyroidism of renal origin: Secondary | ICD-10-CM | POA: Diagnosis present

## 2019-08-23 DIAGNOSIS — Z87891 Personal history of nicotine dependence: Secondary | ICD-10-CM

## 2019-08-23 DIAGNOSIS — D696 Thrombocytopenia, unspecified: Secondary | ICD-10-CM

## 2019-08-23 DIAGNOSIS — R0902 Hypoxemia: Secondary | ICD-10-CM | POA: Diagnosis present

## 2019-08-23 LAB — MAGNESIUM: Magnesium: 2.1 mg/dL (ref 1.7–2.4)

## 2019-08-23 LAB — BASIC METABOLIC PANEL
Anion gap: 12 (ref 5–15)
BUN: 43 mg/dL — ABNORMAL HIGH (ref 8–23)
CO2: 22 mmol/L (ref 22–32)
Calcium: 7.7 mg/dL — ABNORMAL LOW (ref 8.9–10.3)
Chloride: 102 mmol/L (ref 98–111)
Creatinine, Ser: 5.33 mg/dL — ABNORMAL HIGH (ref 0.61–1.24)
GFR calc Af Amer: 12 mL/min — ABNORMAL LOW (ref >60)
GFR calc non Af Amer: 11 mL/min — ABNORMAL LOW (ref >60)
Glucose, Bld: 140 mg/dL — ABNORMAL HIGH (ref 70–99)
Potassium: 3.4 mmol/L — ABNORMAL LOW (ref 3.5–5.1)
Sodium: 136 mmol/L (ref 135–145)

## 2019-08-23 LAB — CBC WITH DIFFERENTIAL/PLATELET
Abs Immature Granulocytes: 0.04 10*3/uL (ref 0.00–0.07)
Basophils Absolute: 0 10*3/uL (ref 0.0–0.1)
Basophils Relative: 0 %
Eosinophils Absolute: 0.1 10*3/uL (ref 0.0–0.5)
Eosinophils Relative: 2 %
HCT: 26 % — ABNORMAL LOW (ref 39.0–52.0)
Hemoglobin: 8.2 g/dL — ABNORMAL LOW (ref 13.0–17.0)
Immature Granulocytes: 1 %
Lymphocytes Relative: 14 %
Lymphs Abs: 1.1 10*3/uL (ref 0.7–4.0)
MCH: 28.7 pg (ref 26.0–34.0)
MCHC: 31.5 g/dL (ref 30.0–36.0)
MCV: 90.9 fL (ref 80.0–100.0)
Monocytes Absolute: 0.8 10*3/uL (ref 0.1–1.0)
Monocytes Relative: 10 %
Neutro Abs: 5.8 10*3/uL (ref 1.7–7.7)
Neutrophils Relative %: 73 %
Platelets: 20 10*3/uL — CL (ref 150–400)
RBC: 2.86 MIL/uL — ABNORMAL LOW (ref 4.22–5.81)
RDW: 13.5 % (ref 11.5–15.5)
WBC: 7.8 10*3/uL (ref 4.0–10.5)
nRBC: 0 % (ref 0.0–0.2)

## 2019-08-23 LAB — C-REACTIVE PROTEIN: CRP: 15.8 mg/dL — ABNORMAL HIGH (ref <1.0)

## 2019-08-23 LAB — HEPATIC FUNCTION PANEL
ALT: 35 U/L (ref 0–44)
AST: 40 U/L (ref 15–41)
Albumin: 3.1 g/dL — ABNORMAL LOW (ref 3.5–5.0)
Alkaline Phosphatase: 53 U/L (ref 38–126)
Bilirubin, Direct: 0.2 mg/dL (ref 0.0–0.2)
Indirect Bilirubin: 0.4 mg/dL (ref 0.3–0.9)
Total Bilirubin: 0.6 mg/dL (ref 0.3–1.2)
Total Protein: 6.9 g/dL (ref 6.5–8.1)

## 2019-08-23 LAB — CBC
HCT: 25.7 % — ABNORMAL LOW (ref 39.0–52.0)
Hemoglobin: 8.3 g/dL — ABNORMAL LOW (ref 13.0–17.0)
MCH: 29 pg (ref 26.0–34.0)
MCHC: 32.3 g/dL (ref 30.0–36.0)
MCV: 89.9 fL (ref 80.0–100.0)
Platelets: 19 10*3/uL — CL (ref 150–400)
RBC: 2.86 MIL/uL — ABNORMAL LOW (ref 4.22–5.81)
RDW: 13.4 % (ref 11.5–15.5)
WBC: 8.2 10*3/uL (ref 4.0–10.5)
nRBC: 0 % (ref 0.0–0.2)

## 2019-08-23 LAB — LACTATE DEHYDROGENASE: LDH: 310 U/L — ABNORMAL HIGH (ref 98–192)

## 2019-08-23 LAB — TROPONIN I (HIGH SENSITIVITY)
Troponin I (High Sensitivity): 176 ng/L (ref <18)
Troponin I (High Sensitivity): 186 ng/L (ref <18)

## 2019-08-23 LAB — PHOSPHORUS: Phosphorus: 4 mg/dL (ref 2.5–4.6)

## 2019-08-23 LAB — POC SARS CORONAVIRUS 2 AG: SARS Coronavirus 2 Ag: POSITIVE — AB

## 2019-08-23 LAB — FERRITIN: Ferritin: 264 ng/mL (ref 24–336)

## 2019-08-23 LAB — BRAIN NATRIURETIC PEPTIDE: B Natriuretic Peptide: 143 pg/mL — ABNORMAL HIGH (ref 0.0–100.0)

## 2019-08-23 MED ORDER — MAGNESIUM HYDROXIDE 400 MG/5ML PO SUSP
30.0000 mL | Freq: Every day | ORAL | Status: DC | PRN
  Filled 2019-08-23: qty 30

## 2019-08-23 MED ORDER — ONDANSETRON HCL 4 MG PO TABS: 4.0000 mg | ORAL_TABLET | Freq: Four times a day (QID) | ORAL | Status: DC | PRN

## 2019-08-23 MED ORDER — DILTIAZEM HCL ER COATED BEADS 240 MG PO CP24
240.0000 mg | ORAL_CAPSULE | Freq: Every day | ORAL | Status: DC
  Administered 2019-08-23 – 2019-08-27 (×4): 240 mg via ORAL
  Filled 2019-08-23 (×6): qty 1

## 2019-08-23 MED ORDER — SODIUM CHLORIDE 0.9 % IV SOLN
100.0000 mg | Freq: Every day | INTRAVENOUS | Status: AC
  Administered 2019-08-24 – 2019-08-27 (×4): 100 mg via INTRAVENOUS
  Filled 2019-08-23 (×2): qty 20
  Filled 2019-08-23: qty 100
  Filled 2019-08-23: qty 20

## 2019-08-23 MED ORDER — ROSUVASTATIN CALCIUM 10 MG PO TABS
10.0000 mg | ORAL_TABLET | Freq: Every day | ORAL | Status: DC
  Administered 2019-08-24 – 2019-08-27 (×4): 10 mg via ORAL
  Filled 2019-08-23 (×4): qty 1

## 2019-08-23 MED ORDER — ACETAMINOPHEN 325 MG PO TABS: 650.0000 mg | ORAL_TABLET | Freq: Four times a day (QID) | ORAL | Status: DC | PRN

## 2019-08-23 MED ORDER — SODIUM CHLORIDE 0.9 % IV SOLN: 250.0000 mL | INTRAVENOUS | Status: DC | PRN

## 2019-08-23 MED ORDER — MIDODRINE HCL 5 MG PO TABS
5.0000 mg | ORAL_TABLET | ORAL | Status: DC
  Administered 2019-08-25 – 2019-08-27 (×2): 5 mg via ORAL
  Filled 2019-08-23 (×3): qty 1

## 2019-08-23 MED ORDER — SODIUM CHLORIDE 0.9 % IV SOLN
200.0000 mg | Freq: Once | INTRAVENOUS | Status: AC
  Administered 2019-08-24: 200 mg via INTRAVENOUS
  Filled 2019-08-23: qty 40

## 2019-08-23 MED ORDER — ACETAMINOPHEN 650 MG RE SUPP: 650.0000 mg | Freq: Four times a day (QID) | RECTAL | Status: DC | PRN

## 2019-08-23 MED ORDER — SODIUM CHLORIDE 0.9% FLUSH
3.0000 mL | INTRAVENOUS | Status: DC | PRN
  Administered 2019-08-24: 11:00:00 3 mL via INTRAVENOUS

## 2019-08-23 MED ORDER — ASPIRIN EC 81 MG PO TBEC
81.0000 mg | DELAYED_RELEASE_TABLET | Freq: Every day | ORAL | Status: DC
  Administered 2019-08-24 (×2): 81 mg via ORAL
  Filled 2019-08-23 (×2): qty 1

## 2019-08-23 MED ORDER — SODIUM CHLORIDE 0.9% FLUSH
3.0000 mL | Freq: Two times a day (BID) | INTRAVENOUS | Status: DC
  Administered 2019-08-24 (×2): 3 mL via INTRAVENOUS

## 2019-08-23 MED ORDER — TRAZODONE HCL 50 MG PO TABS: 25.0000 mg | ORAL_TABLET | Freq: Every evening | ORAL | Status: DC | PRN

## 2019-08-23 MED ORDER — FUROSEMIDE 10 MG/ML IJ SOLN
40.0000 mg | Freq: Two times a day (BID) | INTRAMUSCULAR | Status: DC
  Administered 2019-08-24 – 2019-08-27 (×5): 40 mg via INTRAVENOUS
  Filled 2019-08-23 (×8): qty 4

## 2019-08-23 MED ORDER — POTASSIUM CHLORIDE 20 MEQ PO PACK
40.0000 meq | PACK | Freq: Once | ORAL | Status: AC
  Administered 2019-08-23: 40 meq via ORAL
  Filled 2019-08-23: qty 2

## 2019-08-23 MED ORDER — ONDANSETRON HCL 4 MG/2ML IJ SOLN: 4.0000 mg | Freq: Four times a day (QID) | INTRAMUSCULAR | Status: DC | PRN

## 2019-08-23 MED ORDER — DEXAMETHASONE SODIUM PHOSPHATE 10 MG/ML IJ SOLN
6.0000 mg | INTRAMUSCULAR | Status: DC
  Administered 2019-08-24 – 2019-08-26 (×4): 6 mg via INTRAVENOUS
  Filled 2019-08-23 (×4): qty 1

## 2019-08-23 MED ORDER — TAMSULOSIN HCL 0.4 MG PO CAPS
0.4000 mg | ORAL_CAPSULE | Freq: Every day | ORAL | Status: DC
  Administered 2019-08-24 – 2019-08-27 (×4): 0.4 mg via ORAL
  Filled 2019-08-23 (×4): qty 1

## 2019-08-23 MED ORDER — VITAMIN D 25 MCG (1000 UNIT) PO TABS
1000.0000 [IU] | ORAL_TABLET | Freq: Every day | ORAL | Status: DC
  Administered 2019-08-24 – 2019-08-27 (×5): 1000 [IU] via ORAL
  Filled 2019-08-23 (×5): qty 1

## 2019-08-23 MED ORDER — VITAMIN B-6 50 MG PO TABS
50.0000 mg | ORAL_TABLET | Freq: Every day | ORAL | Status: DC
  Administered 2019-08-24 – 2019-08-27 (×4): 50 mg via ORAL
  Filled 2019-08-23 (×6): qty 1

## 2019-08-23 MED ORDER — ALLOPURINOL 100 MG PO TABS
50.0000 mg | ORAL_TABLET | ORAL | Status: DC
  Administered 2019-08-24 – 2019-08-26 (×2): 50 mg via ORAL
  Filled 2019-08-23 (×4): qty 1

## 2019-08-23 MED ORDER — SODIUM CHLORIDE 0.9% IV SOLUTION: Freq: Once | INTRAVENOUS | Status: AC

## 2019-08-23 MED ORDER — SODIUM CHLORIDE 0.9% FLUSH
3.0000 mL | Freq: Once | INTRAVENOUS | Status: AC
  Administered 2019-08-23: 3 mL via INTRAVENOUS

## 2019-08-23 NOTE — ED Triage Notes (Addendum)
Pt comes via [facility] from Dialysis with c/o heart racing per pt. Pt also states dialysis said his O2 was low. Pt denies any SOB. Pt's current O2 97%RA. Pt states COVID+ result last Friday.  Pt denies any CP or any other complaints.  Pt talking on the phone in NAD at this time.  Current HR-117

## 2019-08-23 NOTE — ED Notes (Signed)
Pt resting in bed at this time, pt is currently eating a dinner tray, denies any complaints. Call light in reach, will continue to monitor.

## 2019-08-23 NOTE — ED Provider Notes (Signed)
[facility] Emergency Department Provider Note  ____________________________________________   First MD Initiated Contact with Patient 08/23/19 1601     (approximate)  I have reviewed the triage vital signs and the nursing notes.  History  Chief Complaint Tachycardia    HPI Peter Pearson is a 64 y.o. male with hx of ESRD 2/2 [place], recently started on HD this month, HTN, DM, anemia of chronic disease, obesity, who presents from dialysis for an episode of hypoxia and tachycardia.  Patient had completed approximately 1.5 hours of dialysis when he was noted to be hypoxic to the upper 80s as well as tachycardic.  Dialysis transferred him to the emergency department for further evaluation.  By my arrival, the patient states he is completely asymptomatic, and in fact states that even during this episode he had no other accompanying symptoms.  Denies any chest pain, palpitations, shortness of breath at present or with this episode.  Does report recently testing positive for COVID on Friday, after developing fevers on Thursday.  He feels like he is doing well from this perspective.  Denies any vomiting or diarrhea.   Past Medical Hx Past Medical History:  Diagnosis Date  . Chronic kidney disease   . Hyperlipidemia   . Hypertension   . Sleep apnea     Problem List Patient Active Problem List   Diagnosis Date Noted  . ESRD (end stage renal disease) on dialysis ([place]) 08/07/2019  . Essential hypertension 08/07/2019  . Anemia of chronic kidney failure, stage 5 ([place]) 08/07/2019    Past Surgical Hx Past Surgical History:  Procedure Laterality Date  . AV FISTULA PLACEMENT Left 08/10/2019   Procedure: Left Upper Extremity AV Fistula creation;  Surgeon: Algernon Huxley, MD;  Location: [facility] ORS;  Service: Vascular;  Laterality: Left;  . DIALYSIS/PERMA CATHETER INSERTION N/A 08/08/2019   Procedure: DIALYSIS/PERMA CATHETER INSERTION;  Surgeon: Algernon Huxley, MD;  Location:  [place] [facility];  Service: Cardiovascular;  Laterality: N/A;    Medications Prior to Admission medications   Medication Sig Start Date End Date Taking? Authorizing Provider  allopurinol (ZYLOPRIM) 100 MG tablet Take 100 mg by mouth daily.    [provider]  midodrine (PROAMATINE) 5 MG tablet Take 5 mg of midodrine on dialysis days (Tuesday, Thursday, and Saturday) 08/11/19   Elodia Florence., MD  pyridOXINE (VITAMIN B-6) 50 MG tablet Take 50 mg by mouth daily.    [provider]  rosuvastatin (CRESTOR) 20 MG tablet Take 10 mg by mouth daily.    [provider]  tamsulosin (FLOMAX) 0.4 MG CAPS capsule Take 0.4 mg by mouth daily.    [provider]    Allergies Patient has no known allergies.  Family Hx Family History  Problem Relation Age of Onset  . Heart disease Father     Social Hx Social History   Tobacco Use  . Smoking status: Former Smoker    Quit date: 08/06/1989    Years since quitting: 30.0  . Smokeless tobacco: Never Used  Substance Use Topics  . Alcohol use: Never  . Drug use: Never     Review of Systems  Constitutional: Negative for fever, chills. Eyes: Negative for visual changes. ENT: Negative for sore throat. Cardiovascular: Negative for chest pain.  Positive for tachycardia. Respiratory: Negative for shortness of breath.  Positive for hypoxia. Gastrointestinal: Negative for nausea, vomiting.  Genitourinary: Negative for dysuria. Musculoskeletal: Negative for leg swelling. Skin: Negative for rash. Neurological: Negative for  headaches.   Physical Exam  Vital Signs: ED Triage Vitals  Enc Vitals Group     BP 08/23/19 1352 (!) 107/57     Pulse Rate 08/23/19 1352 (!) 117     Resp 08/23/19 1352 18     Temp 08/23/19 1352 98.6 F (37 C)     Temp src --      SpO2 08/23/19 1352 97 %     Weight 08/23/19 1350 276 lb 0.3 oz (125.2 kg)     Height 08/23/19 1350 5\' 8"  (1.727 m)     Head Circumference --       Peak Flow --      Pain Score 08/23/19 1350 0     Pain Loc --      Pain Edu? --      Excl. in [place]? --     Constitutional: Alert and oriented.  Obese. Head: Normocephalic. Atraumatic. Eyes: Conjunctivae clear, sclera anicteric. Pupils equal and symmetric. Nose: No masses or lesions. No congestion or rhinorrhea. Mouth/Throat: Wearing mask.  Neck: No stridor. Trachea midline.  Cardiovascular: Tachycardic, regular rhythm. Extremities well perfused.  Dialysis catheter in right upper chest.  Respiratory: Normal respiratory effort.  Lungs CTAB. No hypoxia.  Gastrointestinal: Soft. Non-distended. Non-tender.  Genitourinary: Deferred. Musculoskeletal: No deformities. No cyanosis.  Neurologic:  Normal speech and language. No gross focal or lateralizing neurologic deficits are appreciated.  Skin: Skin is warm, dry and intact. No rash noted. Psychiatric: Mood and affect are appropriate for situation.  EKG  Personally reviewed and interpreted by myself.   Rate: 118 Rhythm: sinus Axis: LAD Intervals: WNL No STEMI    Radiology  CXR IMPRESSION:  No acute process in the chest.    Procedures  Procedure(s) performed (including critical care):  Procedures   Initial Impression / Assessment and Plan / MDM / ED Course  64 y.o. male who presents to the ED for an episode of tachycardia and hypoxia while at dialysis.  By arrival to the emergency department he demonstrates no hypoxia, oxygen high 90s on room air.  HR 117 on arrival.  Denies any chest pain or shortness of breath.  Ddx: response to dialysis, anemia, 2/2 COVID, pulmonary infection, PE  Will evaluate with labs, EKG, XR imaging  Notable abnormal labs include HS troponin 176.  Hemoglobin 8.3, consistent with his anemia of chronic disease.  Platelets 18, confirmed on recheck 20.  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  Patient denies any bleeding symptoms at present.  Given his elevated troponin and  new thrombocytopenia, will plan to admit for further monitoring and work-up.  Discussed with hospitalist for admission.  Patient is agreeable with plan.  Discussed possibility of PE with Nephrology regarding possibility of evaluation with CT PE (not hypoxic here, but reportedly hypoxic at dialysis + tachycardia + COVID positive state). With his ESRD status nephrology recommends VQ scan + echocardiogram. Updated hospitalist who is aware.    _______________________________  As part of my medical decision making I have reviewed available labs, radiology tests, reviewed old records, and discussed with consultants (Nephrology).   Final Clinical Impression(s) / ED Diagnosis  Final diagnoses:  COVID-19  Thrombocytopenia ([place])  Elevated troponin       Note:  This document was prepared using Dragon voice recognition software and may include unintentional dictation errors.   Lilia Pro., MD 08/24/19 [id number]

## 2019-08-23 NOTE — ED Triage Notes (Signed)
Platelet count 19.

## 2019-08-23 NOTE — ED Triage Notes (Signed)
Pt in via EMS from dialysis center COVID positive. EMS reports pt has no complaints. EMS reports per RN at facility he was 99% on RA and then it went to 93%.  Pt is new to dialysis and does get SOB with walking.  138/76

## 2019-08-23 NOTE — ED Notes (Signed)
Troponin 176, MD aware.

## 2019-08-23 NOTE — ED Notes (Signed)
Pt ambulates to toilet, pt experiences diarrhea.

## 2019-08-23 NOTE — Progress Notes (Signed)
Remdesivir - Pharmacy Brief Note   O:  ALT: 35  CXR:  SpO2: % on    A/P:  Remdesivir 200 mg IVPB once followed by 100 mg IVPB daily x 4 days.   Peter Pearson D 08/23/2019 9:07 PM

## 2019-08-23 NOTE — H&P (Addendum)
[place] [facility] [place] NAME: Peter Pearson    MR#:  [id number]  DATE OF BIRTH:  09-04-1955  DATE OF ADMISSION:  08/23/2019  PRIMARY CARE PHYSICIAN: System, Pcp Not In   REQUESTING/REFERRING PHYSICIAN: Derrell Lolling, MD  CHIEF COMPLAINT:   Chief Complaint  Patient presents with  . Tachycardia    HISTORY OF PRESENT ILLNESS:  Peter Pearson  is a 64 y.o. male with a known history of dyslipidemia, hypertension, obstructive sleep apnea and end-stage renal disease recently started on hemodialysis on 3/11, who presented to the emergency room with acute onset of tachycardia and brief hypoxia with a pulse ox of 88% on room air while he was on hemodialysis today.  He had a short 1-1/2-hour out of 4-hour session secondarily.  His heart rate was up to 120.  He admitted to cough productive of whitish sputum.  He tested positive for COVID-19 on Friday.  She he stated that he never had any dyspnea or chest pain but admitted to diarrhea yesterday without nausea or vomiting or abdominal pain.  He denied any loss of appetite, loss of taste or smell.  He denied any fever or chills or generalized weakness.  He admitted to orthopnea.  Upon presentation to the emergency room, blood pressure was 107/57 with a pulse of 117 with otherwise normal vital signs.  Pulse oximetry has remained normal throughout ER stay.  Labs revealed anemia close to baseline.  Potassium was 3.4 and a BUN of 43 and creatinine 5.33.  High-sensitivity troponin was 176 and later 186.  Two-view chest x-ray showed no acute cardiopulmonary disease. EKG showed sinus tachycardia with rate 118 and poor R wave progression.  The patient will be admitted to an observation medical monitored bed for further evaluation and management. PAST MEDICAL HISTORY:   Past Medical History:  Diagnosis Date  . Chronic kidney disease   . Hyperlipidemia   . Hypertension   . Sleep apnea     PAST SURGICAL HISTORY:   Past Surgical  History:  Procedure Laterality Date  . AV FISTULA PLACEMENT Left 08/10/2019   Procedure: Left Upper Extremity AV Fistula creation;  Surgeon: Algernon Huxley, MD;  Location: [facility] ORS;  Service: Vascular;  Laterality: Left;  . DIALYSIS/PERMA CATHETER INSERTION N/A 08/08/2019   Procedure: DIALYSIS/PERMA CATHETER INSERTION;  Surgeon: Algernon Huxley, MD;  Location: [place] [facility];  Service: Cardiovascular;  Laterality: N/A;    SOCIAL HISTORY:   Social History   Tobacco Use  . Smoking status: Former Smoker    Quit date: 08/06/1989    Years since quitting: 30.0  . Smokeless tobacco: Never Used  Substance Use Topics  . Alcohol use: Never    FAMILY HISTORY:   Family History  Problem Relation Age of Onset  . Heart disease Father     DRUG ALLERGIES:   Allergies  Allergen Reactions  . Amoxicillin-Pot Clavulanate Rash    REVIEW OF SYSTEMS:   ROS As per history of present illness. All pertinent systems were reviewed above. Constitutional,  HEENT, cardiovascular, respiratory, GI, GU, musculoskeletal, neuro, psychiatric, endocrine,  integumentary and hematologic systems were reviewed and are otherwise  negative/unremarkable except for positive findings mentioned above in the HPI.   MEDICATIONS AT HOME:   Prior to Admission medications   Medication Sig Start Date End Date Taking? Authorizing Provider  allopurinol (ZYLOPRIM) 100 MG tablet Take 100 mg by mouth daily.   Yes [provider]  diltiazem (CARDIZEM CD) 240 MG  24 hr capsule Take 240 mg by mouth daily. 06/07/19  Yes [provider]  midodrine (PROAMATINE) 5 MG tablet Take 5 mg of midodrine on dialysis days (Tuesday, Thursday, and Saturday) 08/11/19  Yes Elodia Florence., MD  pyridOXINE (VITAMIN B-6) 50 MG tablet Take 50-100 mg by mouth daily.    Yes [provider]  rosuvastatin (CRESTOR) 20 MG tablet Take 10 mg by mouth daily.   Yes [provider]  tamsulosin (FLOMAX) 0.4 MG CAPS capsule  Take 0.4 mg by mouth daily.   Yes [provider]      VITAL SIGNS:  Blood pressure (!) 112/55, pulse (!) 103, temperature 98.6 F (37 C), resp. rate 18, height 5\' 8"  (1.727 m), weight 125.2 kg, SpO2 100 %.  PHYSICAL EXAMINATION:  Physical Exam  GENERAL:  64 y.o.-year-old male patient lying in the bed with no acute distress.  EYES: Pupils equal, round, reactive to light and accommodation. No scleral icterus. Extraocular muscles intact.  HEENT: Head atraumatic, normocephalic. Oropharynx and nasopharynx clear.  NECK:  Supple, no jugular venous distention. No thyroid enlargement, no tenderness.  LUNGS: Normal breath sounds bilaterally, no wheezing, rales,rhonchi or crepitation. No use of accessory muscles of respiration.  CARDIOVASCULAR: Regular rate and rhythm, S1, S2 normal. No murmurs, rubs, or gallops.  ABDOMEN: Soft, nondistended, nontender. Bowel sounds present. No organomegaly or mass.  EXTREMITIES: No pedal edema, cyanosis, or clubbing.  NEUROLOGIC: Cranial nerves II through XII are intact. Muscle strength 5/5 in all extremities. Sensation intact. Gait not checked.  PSYCHIATRIC: The patient is alert and oriented x 3.  Normal affect and good eye contact. SKIN: No obvious rash, lesion, or ulcer.   LABORATORY PANEL:   CBC Recent Labs  Lab 08/23/19 1602  WBC 7.8  HGB 8.2*  HCT 26.0*  PLT 20*   ------------------------------------------------------------------------------------------------------------------  Chemistries  Recent Labs  Lab 08/23/19 1350 08/23/19 1624  NA 136  --   K 3.4*  --   CL 102  --   CO2 22  --   GLUCOSE 140*  --   BUN 43*  --   CREATININE 5.33*  --   CALCIUM 7.7*  --   MG  --  2.1  AST  --  40  ALT  --  35  ALKPHOS  --  53  BILITOT  --  0.6   ------------------------------------------------------------------------------------------------------------------  Cardiac Enzymes No results for input(s): TROPONINI in the last 168  hours. ------------------------------------------------------------------------------------------------------------------  RADIOLOGY:  DG Chest 2 View  Result Date: 08/23/2019 CLINICAL DATA:  Shortness of breath EXAM: CHEST - 2 VIEW COMPARISON:  08/09/2019 FINDINGS: Right tunneled dialysis catheter is unchanged. No pleural effusion or pneumothorax. Stable cardiomediastinal contours. No new consolidation or edema. No acute osseous abnormality. IMPRESSION: No acute process in the chest. Electronically Signed   By: Macy Mis M.D.   On: 08/23/2019 14:18      IMPRESSION AND PLAN:   1.  Mild sinus tachycardia with brief episode of hypoxia that resolved, with elevated troponin I. -The patient will be admitted to an observation medical monitored bed. -We will follow troponin I's. -We will obtain a 2D echo in a.m. as well as a cardiology consultation. -I notified Dr. Johnsie Cancel about the patient. -We will obtain a perfusion scan to rule out PE.  2.  COVID-19. -The patient is having cough without dyspnea or hypoxia at this time. -We will start him on IV remdesivir. -We will place him on IV Decadron due to mildly elevated  inflammatory markers. -We will check his D-dimer. -We will obtain a perfusion scan to rule out PE.  3.  End-stage renal disease with [place] syndrome on hemodialysis. -A nephrology consultation will be obtained.  I notified Dr. Candiss Norse about the patient.  4.  Type 2 diabetes mellitus. -The patient will be placed on supplemental coverage with NovoLog.  5.  Thrombocytopenia. -We will transfuse platelets and obtain a hematology consult. -I notified Dr. Janese Banks about the patient. -She has been placed on IV Decadron that should cover an autoimmune etiology.  6.  Dyslipidemia. -Statin therapy will be resumed.  7.  Gout. -Allopurinol will be continued.  8.  BPH. -Flomax will be resumed.  9.  Obstructive sleep apnea. -We will continue home CPAP.  10.  DVT  prophylaxis. -SCDs.  Pending perfusion scan will hold off anticoagulation especially given thrombocytopenia. -Will follow CBC.   All the records are reviewed and case discussed with ED provider. The plan of care was discussed in details with the patient (and family). I answered all questions. The patient agreed to proceed with the above mentioned plan. Further management will depend upon hospital course.   CODE STATUS: Full code  TOTAL TIME TAKING CARE OF THIS PATIENT: 55 minutes.    Christel Mormon M.D on 08/23/2019 at 6:53 PM  [facility]   From 7 PM-7 AM, contact night-coverage [email or web address]  CC: Primary care physician; System, Pcp Not In   Note: This dictation was prepared with Dragon dictation along with smaller phrase technology. Any transcriptional errors that result from this process are unintentional.

## 2019-08-23 NOTE — ED Notes (Signed)
MD contacted on 2nd troponin, asked if repeat was needed. Awaiting order for repeat if desired.

## 2019-08-24 ENCOUNTER — Encounter: Payer: Self-pay | Admitting: Family Medicine

## 2019-08-24 ENCOUNTER — Observation Stay: Payer: PRIVATE HEALTH INSURANCE

## 2019-08-24 ENCOUNTER — Observation Stay (HOSPITAL_COMMUNITY)
Admit: 2019-08-24 | Discharge: 2019-08-24 | Disposition: A | Discharge status: Home / Self Care | Payer: PRIVATE HEALTH INSURANCE | Attending: Family Medicine | Admitting: Family Medicine

## 2019-08-24 DIAGNOSIS — R000 Tachycardia, unspecified: Secondary | ICD-10-CM

## 2019-08-24 DIAGNOSIS — Z87891 Personal history of nicotine dependence: Secondary | ICD-10-CM | POA: Diagnosis not present

## 2019-08-24 DIAGNOSIS — Z6841 Body Mass Index (BMI) 40.0 and over, adult: Secondary | ICD-10-CM | POA: Diagnosis not present

## 2019-08-24 DIAGNOSIS — I248 Other forms of acute ischemic heart disease: Secondary | ICD-10-CM | POA: Diagnosis present

## 2019-08-24 DIAGNOSIS — D696 Thrombocytopenia, unspecified: Secondary | ICD-10-CM | POA: Diagnosis not present

## 2019-08-24 DIAGNOSIS — H9190 Unspecified hearing loss, unspecified ear: Secondary | ICD-10-CM | POA: Diagnosis present

## 2019-08-24 DIAGNOSIS — G4733 Obstructive sleep apnea (adult) (pediatric): Secondary | ICD-10-CM | POA: Diagnosis present

## 2019-08-24 DIAGNOSIS — D7582 Heparin induced thrombocytopenia (HIT): Secondary | ICD-10-CM | POA: Diagnosis present

## 2019-08-24 DIAGNOSIS — U071 COVID-19: Secondary | ICD-10-CM

## 2019-08-24 DIAGNOSIS — M109 Gout, unspecified: Secondary | ICD-10-CM | POA: Diagnosis present

## 2019-08-24 DIAGNOSIS — I10 Essential (primary) hypertension: Secondary | ICD-10-CM

## 2019-08-24 DIAGNOSIS — Z8249 Family history of ischemic heart disease and other diseases of the circulatory system: Secondary | ICD-10-CM | POA: Diagnosis not present

## 2019-08-24 DIAGNOSIS — N186 End stage renal disease: Secondary | ICD-10-CM | POA: Diagnosis present

## 2019-08-24 DIAGNOSIS — Z79899 Other long term (current) drug therapy: Secondary | ICD-10-CM | POA: Diagnosis not present

## 2019-08-24 DIAGNOSIS — R05 Cough: Secondary | ICD-10-CM | POA: Diagnosis not present

## 2019-08-24 DIAGNOSIS — Z880 Allergy status to penicillin: Secondary | ICD-10-CM | POA: Diagnosis not present

## 2019-08-24 DIAGNOSIS — N2581 Secondary hyperparathyroidism of renal origin: Secondary | ICD-10-CM | POA: Diagnosis present

## 2019-08-24 DIAGNOSIS — Z992 Dependence on renal dialysis: Secondary | ICD-10-CM | POA: Diagnosis not present

## 2019-08-24 DIAGNOSIS — E1122 Type 2 diabetes mellitus with diabetic chronic kidney disease: Secondary | ICD-10-CM | POA: Diagnosis present

## 2019-08-24 DIAGNOSIS — R778 Other specified abnormalities of plasma proteins: Secondary | ICD-10-CM

## 2019-08-24 DIAGNOSIS — N400 Enlarged prostate without lower urinary tract symptoms: Secondary | ICD-10-CM | POA: Diagnosis present

## 2019-08-24 DIAGNOSIS — Q8781 Alport syndrome: Secondary | ICD-10-CM | POA: Diagnosis not present

## 2019-08-24 DIAGNOSIS — I120 Hypertensive chronic kidney disease with stage 5 chronic kidney disease or end stage renal disease: Secondary | ICD-10-CM | POA: Diagnosis present

## 2019-08-24 DIAGNOSIS — E785 Hyperlipidemia, unspecified: Secondary | ICD-10-CM | POA: Diagnosis present

## 2019-08-24 DIAGNOSIS — D631 Anemia in chronic kidney disease: Secondary | ICD-10-CM | POA: Diagnosis present

## 2019-08-24 DIAGNOSIS — R0902 Hypoxemia: Secondary | ICD-10-CM | POA: Diagnosis present

## 2019-08-24 DIAGNOSIS — E669 Obesity, unspecified: Secondary | ICD-10-CM | POA: Diagnosis present

## 2019-08-24 LAB — COMPREHENSIVE METABOLIC PANEL
ALT: 31 U/L (ref 0–44)
AST: 35 U/L (ref 15–41)
Albumin: 2.9 g/dL — ABNORMAL LOW (ref 3.5–5.0)
Alkaline Phosphatase: 52 U/L (ref 38–126)
Anion gap: 14 (ref 5–15)
BUN: 59 mg/dL — ABNORMAL HIGH (ref 8–23)
CO2: 20 mmol/L — ABNORMAL LOW (ref 22–32)
Calcium: 7.7 mg/dL — ABNORMAL LOW (ref 8.9–10.3)
Chloride: 103 mmol/L (ref 98–111)
Creatinine, Ser: 6.78 mg/dL — ABNORMAL HIGH (ref 0.61–1.24)
GFR calc Af Amer: 9 mL/min — ABNORMAL LOW (ref >60)
GFR calc non Af Amer: 8 mL/min — ABNORMAL LOW (ref >60)
Glucose, Bld: 186 mg/dL — ABNORMAL HIGH (ref 70–99)
Potassium: 4.2 mmol/L (ref 3.5–5.1)
Sodium: 137 mmol/L (ref 135–145)
Total Bilirubin: 0.4 mg/dL (ref 0.3–1.2)
Total Protein: 6.6 g/dL (ref 6.5–8.1)

## 2019-08-24 LAB — CBC
HCT: 24.6 % — ABNORMAL LOW (ref 39.0–52.0)
Hemoglobin: 7.7 g/dL — ABNORMAL LOW (ref 13.0–17.0)
MCH: 28.3 pg (ref 26.0–34.0)
MCHC: 31.3 g/dL (ref 30.0–36.0)
MCV: 90.4 fL (ref 80.0–100.0)
Platelets: 53 10*3/uL — ABNORMAL LOW (ref 150–400)
RBC: 2.72 MIL/uL — ABNORMAL LOW (ref 4.22–5.81)
RDW: 13.5 % (ref 11.5–15.5)
WBC: 7.3 10*3/uL (ref 4.0–10.5)
nRBC: 0 % (ref 0.0–0.2)

## 2019-08-24 LAB — ECHOCARDIOGRAM COMPLETE
Height: 68 in
Weight: 4416.25 oz

## 2019-08-24 LAB — IRON AND TIBC
Iron: 13 ug/dL — ABNORMAL LOW (ref 45–182)
Saturation Ratios: 6 % — ABNORMAL LOW (ref 17.9–39.5)
TIBC: 228 ug/dL — ABNORMAL LOW (ref 250–450)
UIBC: 215 ug/dL

## 2019-08-24 LAB — VITAMIN B12: Vitamin B-12: 1171 pg/mL — ABNORMAL HIGH (ref 180–914)

## 2019-08-24 LAB — GLUCOSE, CAPILLARY: Glucose-Capillary: 218 mg/dL — ABNORMAL HIGH (ref 70–99)

## 2019-08-24 LAB — RETICULOCYTES
Immature Retic Fract: 10.8 % (ref 2.3–15.9)
RBC.: 2.63 MIL/uL — ABNORMAL LOW (ref 4.22–5.81)
Retic Count, Absolute: 17.9 10*3/uL — ABNORMAL LOW (ref 19.0–186.0)
Retic Ct Pct: 0.7 % (ref 0.4–3.1)

## 2019-08-24 LAB — C-REACTIVE PROTEIN: CRP: 14.4 mg/dL — ABNORMAL HIGH (ref <1.0)

## 2019-08-24 LAB — TROPONIN I (HIGH SENSITIVITY): Troponin I (High Sensitivity): 271 ng/L (ref <18)

## 2019-08-24 LAB — IMMATURE PLATELET FRACTION: Immature Platelet Fraction: 4.4 % (ref 1.2–8.6)

## 2019-08-24 LAB — ABO/RH: ABO/RH(D): A POS

## 2019-08-24 LAB — FIBRIN DERIVATIVES D-DIMER (ARMC ONLY): Fibrin derivatives D-dimer (ARMC): >7500 ng/mL (FEU) — ABNORMAL HIGH (ref 0.00–499.00)

## 2019-08-24 LAB — PATHOLOGIST SMEAR REVIEW

## 2019-08-24 MED ORDER — GUAIFENESIN-DM 100-10 MG/5ML PO SYRP: 10.0000 mL | ORAL_SOLUTION | ORAL | Status: DC | PRN

## 2019-08-24 MED ORDER — HYDROCOD POLST-CPM POLST ER 10-8 MG/5ML PO SUER
5.0000 mL | Freq: Two times a day (BID) | ORAL | Status: DC | PRN
  Administered 2019-08-27: 5 mL via ORAL
  Filled 2019-08-24: qty 5

## 2019-08-24 MED ORDER — INSULIN DETEMIR 100 UNIT/ML ~~LOC~~ SOLN
0.0750 [IU]/kg | Freq: Two times a day (BID) | SUBCUTANEOUS | Status: DC
  Administered 2019-08-25 – 2019-08-27 (×5): 9 [IU] via SUBCUTANEOUS
  Filled 2019-08-24 (×9): qty 0.09

## 2019-08-24 MED ORDER — INSULIN ASPART 100 UNIT/ML ~~LOC~~ SOLN
0.0000 [IU] | Freq: Every day | SUBCUTANEOUS | Status: DC
  Administered 2019-08-26 – 2019-08-27 (×2): 2 [IU] via SUBCUTANEOUS
  Filled 2019-08-24 (×3): qty 1

## 2019-08-24 MED ORDER — ASCORBIC ACID 500 MG PO TABS
500.0000 mg | ORAL_TABLET | Freq: Every day | ORAL | Status: DC
  Administered 2019-08-25 – 2019-08-27 (×3): 500 mg via ORAL
  Filled 2019-08-24 (×3): qty 1

## 2019-08-24 MED ORDER — TECHNETIUM TO 99M ALBUMIN AGGREGATED
4.0000 | Freq: Once | INTRAVENOUS | Status: AC | PRN
  Administered 2019-08-24: 4 via INTRAVENOUS

## 2019-08-24 MED ORDER — INSULIN ASPART 100 UNIT/ML ~~LOC~~ SOLN
3.0000 [IU] | Freq: Three times a day (TID) | SUBCUTANEOUS | Status: DC
  Administered 2019-08-25 – 2019-08-27 (×8): 3 [IU] via SUBCUTANEOUS
  Filled 2019-08-24 (×10): qty 1

## 2019-08-24 MED ORDER — ZINC SULFATE 220 (50 ZN) MG PO CAPS
220.0000 mg | ORAL_CAPSULE | Freq: Every day | ORAL | Status: DC
  Administered 2019-08-25 – 2019-08-27 (×3): 220 mg via ORAL
  Filled 2019-08-24 (×3): qty 1

## 2019-08-24 MED ORDER — INFLUENZA VAC SPLIT QUAD 0.5 ML IM SUSY: 0.5000 mL | PREFILLED_SYRINGE | INTRAMUSCULAR | Status: DC

## 2019-08-24 MED ORDER — INSULIN ASPART 100 UNIT/ML ~~LOC~~ SOLN
0.0000 [IU] | Freq: Three times a day (TID) | SUBCUTANEOUS | Status: DC
  Administered 2019-08-25: 7 [IU] via SUBCUTANEOUS
  Administered 2019-08-25: 9 [IU] via SUBCUTANEOUS
  Administered 2019-08-26: 17:00:00 5 [IU] via SUBCUTANEOUS
  Administered 2019-08-26: 14:00:00 3 [IU] via SUBCUTANEOUS
  Administered 2019-08-26 – 2019-08-27 (×2): 2 [IU] via SUBCUTANEOUS
  Administered 2019-08-27 (×2): 3 [IU] via SUBCUTANEOUS
  Filled 2019-08-24 (×10): qty 1

## 2019-08-24 MED ORDER — LINAGLIPTIN 5 MG PO TABS
5.0000 mg | ORAL_TABLET | Freq: Every day | ORAL | Status: DC
  Administered 2019-08-26 – 2019-08-27 (×2): 5 mg via ORAL
  Filled 2019-08-24 (×5): qty 1

## 2019-08-24 NOTE — Consult Note (Signed)
Cardiology Consultation:   Patient ID: Peter Pearson MRN: [id number]; DOB: 01/09/1956  Admit date: 08/23/2019 Date of Consult: 08/24/2019  Primary Care Provider: System, Pcp Not In Primary Cardiologist: [place] rounding Primary Electrophysiologist:  None    Patient Profile:   Peter Pearson is a 64 y.o. male with a hx of end-stage renal disease on dialysis, hypertension, sleep apnea who is being seen today for the evaluation of tachycardia at the request of Dr. Sidney Ace.  History of Present Illness:   Peter Pearson is a 64 year old male with history of end-stage renal disease on dialysis, hypertension, hyperlipidemia, obstructive sleep apnea on CPAP, recent COVID-19 diagnosis who presents due to tachycardia and hypoxia.  Patient states being diagnosed with Kovic 19 5 days ago.  He denies symptoms of shortness of breath, chest pain, palpitations or muscle weakness.  He went for her regular dialysis session and was noted to be hypoxic with oxygen saturation in the 80s, and also tachycardic.  He was then brought to the emergency room.    In the ED on admission, EKG showed sinus tachycardia with heart rate 118.  Troponins were 171, 186.  Chest x-ray did not show any acute process.  A nuclear magnetic pulmonary perfusion scan to evaluate PE was ordered.  Patient denies any cardiac history.   Past Medical History:  Diagnosis Date  . Chronic kidney disease   . Hyperlipidemia   . Hypertension   . Sleep apnea     Past Surgical History:  Procedure Laterality Date  . AV FISTULA PLACEMENT Left 08/10/2019   Procedure: Left Upper Extremity AV Fistula creation;  Surgeon: Algernon Huxley, MD;  Location: [facility];  Service: Vascular;  Laterality: Left;  . DIALYSIS/PERMA CATHETER INSERTION N/A 08/08/2019   Procedure: DIALYSIS/PERMA CATHETER INSERTION;  Surgeon: Algernon Huxley, MD;  Location: [facility];  Service: Cardiovascular;  Laterality: N/A;     Home Medications:  Prior to  Admission medications   Medication Sig Start Date End Date Taking? Authorizing Provider  allopurinol (ZYLOPRIM) 100 MG tablet Take 100 mg by mouth daily.   Yes [provider]  diltiazem (CARDIZEM CD) 240 MG 24 hr capsule Take 240 mg by mouth daily. 06/07/19  Yes [provider]  midodrine (PROAMATINE) 5 MG tablet Take 5 mg of midodrine on dialysis days (Tuesday, Thursday, and Saturday) 08/11/19  Yes Elodia Florence., MD  pyridOXINE (VITAMIN B-6) 50 MG tablet Take 50-100 mg by mouth daily.    Yes [provider]  rosuvastatin (CRESTOR) 20 MG tablet Take 10 mg by mouth daily.   Yes [provider]  tamsulosin (FLOMAX) 0.4 MG CAPS capsule Take 0.4 mg by mouth daily.   Yes [provider]    Inpatient Medications: Scheduled Meds: . allopurinol  50 mg Oral QODAY  . aspirin EC  81 mg Oral Daily  . cholecalciferol  1,000 Units Oral Daily  . dexamethasone (DECADRON) injection  6 mg Intravenous Q24H  . diltiazem  240 mg Oral Daily  . furosemide  40 mg Intravenous BID  . [START ON 08/25/2019] influenza vac split quadrivalent PF  0.5 mL Intramuscular Tomorrow-1000  . midodrine  5 mg Oral Q T,Th,Sat-1800  . pyridOXINE  50 mg Oral Daily  . rosuvastatin  10 mg Oral Daily  . sodium chloride flush  3 mL Intravenous Q12H  . tamsulosin  0.4 mg Oral Daily   Continuous Infusions: . sodium chloride    . remdesivir 100 mg in NS 100  mL 100 mg (08/24/19 1058)   PRN Meds: sodium chloride, acetaminophen **OR** acetaminophen, magnesium hydroxide, ondansetron **OR** ondansetron (ZOFRAN) IV, sodium chloride flush, traZODone  Allergies:    Allergies  Allergen Reactions  . Amoxicillin-Pot Clavulanate Rash    Social History:   Social History   Socioeconomic History  . Marital status: Married    Spouse name: Shamim  . Number of children: 3  . Years of education: Not on file  . Highest education level: Not on file  Occupational History  . Not on file   Tobacco Use  . Smoking status: Former Smoker    Quit date: 08/06/1989    Years since quitting: 30.0  . Smokeless tobacco: Never Used  Substance and Sexual Activity  . Alcohol use: Never  . Drug use: Never  . Sexual activity: Not on file  Other Topics Concern  . Not on file  Social History Narrative  . Not on file   Social Determinants of Health   Financial Resource Strain:   . Difficulty of Paying Living Expenses:   Food Insecurity:   . Worried About Charity fundraiser in the Last Year:   . Arboriculturist in the Last Year:   Transportation Needs:   . Film/video editor (Medical):   Marland Kitchen Lack of Transportation (Non-Medical):   Physical Activity:   . Days of Exercise per Week:   . Minutes of Exercise per Session:   Stress:   . Feeling of Stress :   Social Connections:   . Frequency of Communication with Friends and Family:   . Frequency of Social Gatherings with Friends and Family:   . Attends Religious Services:   . Active Member of Clubs or Organizations:   . Attends Archivist Meetings:   Marland Kitchen Marital Status:   Intimate Partner Violence:   . Fear of Current or Ex-Partner:   . Emotionally Abused:   Marland Kitchen Physically Abused:   . Sexually Abused:     Family History:    Family History  Problem Relation Age of Onset  . Heart disease Father      ROS:  Please see the history of present illness.   All other ROS reviewed and negative.     Physical Exam/Data:   Vitals:   08/24/19 0621 08/24/19 0900 08/24/19 0929 08/24/19 0958  BP: (!) 114/56 (!) 93/28 (!) 117/47 (!) 106/41  Pulse: 79 69 85 79  Resp: (!) 28  20 17   Temp: 98.8 F (37.1 C)   97.7 F (36.5 C)  TempSrc: Oral   Oral  SpO2: 96% 97% 100% 97%  Weight:      Height:        Intake/Output Summary (Last 24 hours) at 08/24/2019 1152 Last data filed at 08/24/2019 0920 Gross per 24 hour  Intake 608.33 ml  Output --  Net 608.33 ml   Last 3 Weights 08/23/2019 08/08/2019 08/07/2019  Weight (lbs) 276 lb  0.3 oz 276 lb 0.3 oz 276 lb 0.3 oz  Weight (kg) 125.2 kg 125.2 kg 125.2 kg     Body mass index is 41.97 kg/m.  General:  Well nourished, well developed, mild respiratory distress with occasional cough. HEENT: normal Lymph: no adenopathy Neck: no JVD Endocrine:  No thryomegaly Vascular: No carotid bruits; FA pulses 2+ bilaterally without bruits  Cardiac:  normal S1, S2; RRR; faint systolic murmur Lungs:  clear to auscultation anteriorly, decreased breath sounds at bases. Abd: soft, nontender, no hepatomegaly  Ext: no edema  Musculoskeletal:  No deformities, BUE and BLE strength normal and equal Skin: warm and dry  Neuro:  CNs 2-12 intact, no focal abnormalities noted Psych:  Normal affect   EKG:  The EKG was personally reviewed and demonstrates: Sinus tachycardia Telemetry:  Telemetry was personally reviewed and demonstrates: Normal sinus rhythm heart rate 68  Relevant CV Studies: TTE 08/24/2019 1. Left ventricular ejection fraction, by estimation, is 60 to 65%. The  left ventricle has normal function. The left ventricle has no regional  wall motion abnormalities. There is mild left ventricular hypertrophy.  Left ventricular diastolic function  could not be evaluated.  2. Right ventricular systolic function is normal. The right ventricular  size is normal.  3. The mitral valve is grossly normal. No evidence of mitral valve  regurgitation.  4. The aortic valve is tricuspid. Aortic valve regurgitation is not  visualized. Mild to moderate aortic valve sclerosis/calcification is  present, without any evidence of aortic stenosis.  5. Aortic dilatation noted. 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   Laboratory Data:  High Sensitivity Troponin:   Recent Labs  Lab 08/23/19 1350 08/23/19 1552 08/24/19 0613  TROPONINIHS 176* 186* 271*     Chemistry Recent Labs  Lab 08/23/19 1350 08/24/19 0613  NA 136 137  K 3.4* 4.2  CL 102 103  CO2 22 20*   GLUCOSE 140* 186*  BUN 43* 59*  CREATININE 5.33* 6.78*  CALCIUM 7.7* 7.7*  GFRNONAA 11* 8*  GFRAA 12* 9*  ANIONGAP 12 14    Recent Labs  Lab 08/23/19 1624 08/24/19 0613  PROT 6.9 6.6  ALBUMIN 3.1* 2.9*  AST 40 35  ALT 35 31  ALKPHOS 53 52  BILITOT 0.6 0.4   Hematology Recent Labs  Lab 08/23/19 1350 08/23/19 1602 08/24/19 0613  WBC 8.2 7.8 7.3  RBC 2.86* 2.86* 2.72*  HGB 8.3* 8.2* 7.7*  HCT 25.7* 26.0* 24.6*  MCV 89.9 90.9 90.4  MCH 29.0 28.7 28.3  MCHC 32.3 31.5 31.3  RDW 13.4 13.5 13.5  PLT 19* 20* 53*   BNP Recent Labs  Lab 08/23/19 1623  BNP 143.0*    DDimer No results for input(s): DDIMER in the last 168 hours.   Radiology/Studies:  DG Chest 2 View  Result Date: 08/23/2019 CLINICAL DATA:  Shortness of breath EXAM: CHEST - 2 VIEW COMPARISON:  08/09/2019 FINDINGS: Right tunneled dialysis catheter is unchanged. No pleural effusion or pneumothorax. Stable cardiomediastinal contours. No new consolidation or edema. No acute osseous abnormality. IMPRESSION: No acute process in the chest. Electronically Signed   By: Macy Mis M.D.   On: 08/23/2019 14:18   ECHOCARDIOGRAM COMPLETE  Result Date: 08/24/2019    ECHOCARDIOGRAM REPORT   Patient Name:   Peter Pearson Date of Exam: 08/24/2019 Medical Rec #:  [id number]      Height:       68.0 in Accession #:    [id number]     Weight:       276.0 lb Date of Birth:  December 27, 1955      BSA:          2.344 m Patient Age:    28 years       BP:           114/56 mmHg Patient Gender: M              HR:           79 bpm. Exam Location:  [facility] Procedure:  Color Doppler and Cardiac Doppler Indications:     Elevated troponin  History:         Patient has no prior history of Echocardiogram examinations.                  Risk Factors:Hypertension and Sleep Apnea.  Sonographer:     Sherrie Sport RDCS (AE) Referring Phys:  [id number] Arvella Merles MANSY Diagnosing Phys: Kate Sable MD  Sonographer Comments: No apical window. IMPRESSIONS  1. Left  ventricular ejection fraction, by estimation, is 60 to 65%. The left ventricle has normal function. The left ventricle has no regional wall motion abnormalities. There is mild left ventricular hypertrophy. Left ventricular diastolic function could not be evaluated.  2. Right ventricular systolic function is normal. The right ventricular size is normal.  3. The mitral valve is grossly normal. No evidence of mitral valve regurgitation.  4. The aortic valve is tricuspid. Aortic valve regurgitation is not visualized. Mild to moderate aortic valve sclerosis/calcification is present, without any evidence of aortic stenosis.  5. Aortic dilatation noted. There is mild dilatation of the aortic root measuring 42 mm. FINDINGS  Left Ventricle: Left ventricular ejection fraction, by estimation, is 60 to 65%. The left ventricle has normal function. The left ventricle has no regional wall motion abnormalities. The left ventricular internal cavity size was normal in size. There is  mild left ventricular hypertrophy. Left ventricular diastolic function could not be evaluated. Right Ventricle: The right ventricular size is normal. No increase in right ventricular wall thickness. Right ventricular systolic function is normal. Left Atrium: Left atrial size was normal in size. Right Atrium: Right atrial size was not well visualized. Pericardium: There is no evidence of pericardial effusion. Mitral Valve: The mitral valve is grossly normal. No evidence of mitral valve regurgitation. Tricuspid Valve: The tricuspid valve is grossly normal. Tricuspid valve regurgitation is not demonstrated. Aortic Valve: The aortic valve is tricuspid. Aortic valve regurgitation is not visualized. Mild to moderate aortic valve sclerosis/calcification is present, without any evidence of aortic stenosis. Pulmonic Valve: The pulmonic valve was not well visualized. Pulmonic valve regurgitation is trivial. Aorta: Aortic dilatation noted. There is mild dilatation  of the aortic root measuring 42 mm. Venous: The inferior vena cava was not well visualized. IAS/Shunts: The interatrial septum was not well visualized.  LEFT VENTRICLE PLAX 2D LVIDd:         5.14 cm LVIDs:         2.65 cm LV PW:         1.39 cm LV IVS:        1.30 cm LVOT diam:     2.20 cm LVOT Area:     3.80 cm  LEFT ATRIUM         Index LA diam:    3.40 cm 1.45 cm/m                        PULMONIC VALVE AORTA                 PV Vmax:        0.89 m/s Ao Root diam: 4.10 cm PV Peak grad:   3.2 mmHg                       RVOT Peak grad: 14 mmHg  TRICUSPID VALVE TR Peak grad:   21.7 mmHg TR Vmax:        233.00 cm/s  SHUNTS Systemic  Diam: 2.20 cm Kate Sable MD Electronically signed by Kate Sable MD Signature Date/Time: 08/24/2019/11:50:30 AM    Final    {   Assessment and Plan:   1.  Tachycardia, elevated troponins -Respiratory distress/recent COVID-19 infection may be contributing to tachycardia. -EKG showed sinus tachycardia, telemetry currently is sinus rhythm -Echocardiogram showed normal ejection fraction with EF 60 to 65%.  No wall motion abnormalities noted. -Troponins have essentially been flat.  In light of tachycardia and CKD this is nonspecific.  I do not think patient is having an ACS. -Patient is clinically asymptomatic, with normal echo, no further cardiac testing is indicated at this point.  2.  History of hypertension -Blood pressures controlled.   Continue PTA blood pressure meds.  3.  COVID-19 infection -Management as per primary team  [facility] will sign off.   Medication Recommendations: Current medications Other recommendations (labs, testing, etc):   Follow up as an outpatient: As needed  For questions or updates, please contact [facility] Please consult [email or web address] for contact info under     Signed, Kate Sable, MD  08/24/2019 11:52 AM

## 2019-08-24 NOTE — Hospital Course (Addendum)
08/24/19 - Received 2 units of platelets, platelet improved to [address].

## 2019-08-24 NOTE — Progress Notes (Signed)
PROGRESS NOTE    Peter Pearson  JAS:[id number] DOB: [birth year] DOA: 08/23/2019 PCP: System, Pcp Not In   Brief Narrative:  Peter Pearson  is a 64 y.o. 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  He had a short 1-1/2-hour out of 4-hour session secondarily.  His heart rate was up to 120.  He admitted to cough productive of whitish sputum.  He tested positive for COVID-19 on Friday.  He denies any chest pain or dyspnea but did had some diarrhea a day prior to admission.  No loss of taste or smell. Chest x-ray without any acute cardiopulmonary disease.  Labs significant for platelet of 19 which was 229 on 08/11/2019. No sign of any active bleeding and elevated troponin.  Markedly elevated inflammatory markers.  Subjective: Patient is Little hard of hearing.  Denies any chest pain or shortness of breath when seen today.  Assessment & Plan:   Active Problems:   Tachycardia   Elevated troponin   COVID-19   Thrombocytopenia (HCC)   Mild sinus tachycardia with brief episode of hypoxia that resolved, with elevated troponin.  No chest pain or persistent dyspnea.  Troponin curve mostly flat.  Most likely secondary to demand.  Echocardiogram was with normal EF and no wall motion abnormalities.  Cardiology was consulted from ED and they are not considering any further work-up at this time. -Perfusion studies were ordered to rule out PE which are still pending.  COVID-19 infection.  Apparently patient was diagnosed on Friday with COVID-19.  Having some cough but denies any fever, chills or dyspnea. Transient hypoxia which improves, now saturating well on room air. Markedly elevated  inflammatory markers.  He was started on remdesivir and Decadron. -Continue remdesivir-2/5 days. -Continue Decadron-2/10 days. -Continue supportive care with antitussives and vitamins supplement. -Continue to monitor inflammatory markers. -NM perfusion studies to rule out PE are pending.  Thrombocytopenia.  Patient with severe thrombocytopenia with platelet of 19 on admission.  It was 229 on 08/11/2019 during his discharge with prior admission. Less possibility of HIT.  Most likely secondary to COVID-19 infection. Hematology was consulted from ED, his fraction for immature platelets is inappropriately normal which shows bone marrow suppression most likely secondary to acute infection. HIT labs were ordered by hematology-pending results. He was given 2 units of platelets overnight with improvement to 53,000 today. No enough active bleeding. -Continue to monitor.  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  Recently started on dialysis.  Nephrology was consulted from ED. -Dialysis per nephrology.  Type 2 diabetes.  Well-controlled with A1c of 6.3 on/08/08/2019. CBG elevated as patient is on steroid now. -Start him on renally dosed insulin with Levemir, and NovoLog.  Normocytic anemia.  Likely secondary to renal disease.  Hemoglobin down to 7.7 today from baseline close to 9.  No obvious bleeding. -Check iron studies, vitamin B12 and RBC folate levels. -Might need EPO with dialysis. -Transfuse if hemoglobin drops below 7.  Dyslipidemia. -Continue statin.  History of gout.  No acute flare. -Continue home dose of allopurinol.  BPH.  -Continue home dose of Flomax.  Obstructive sleep apnea. -CPAP at bedtime.  Objective: Vitals:   08/24/19 0621 08/24/19 0900 08/24/19  5456 08/24/19 0958  BP: (!) 114/56 (!) 93/28 (!) 117/47 (!) 106/41  Pulse: 79 69 85 79  Resp: (!) 28  20 17   Temp: 98.8 F (37.1 C)   97.7 F (36.5 C)  TempSrc: Oral   Oral  SpO2: 96% 97% 100% 97%   Weight:      Height:        Intake/Output Summary (Last 24 hours) at 08/24/2019 1306 Last data filed at 08/24/2019 0920 Gross per 24 hour  Intake 608.33 ml  Output --  Net 608.33 ml   Filed Weights   08/23/19 1350  Weight: 125.2 kg    Examination:  General exam: Well-developed, obese gentleman, appears calm and comfortable  Respiratory system: Clear to auscultation. Respiratory effort normal. Cardiovascular system: S1 & S2 heard, RRR. No JVD, murmurs, rubs, gallops or clicks. Gastrointestinal system: Soft, nontender, nondistended, bowel sounds positive. Central nervous system: Alert and oriented. No focal neurological deficits.Symmetric 5 x 5 power. Extremities: No edema, no cyanosis, pulses intact and symmetrical. Skin: No rashes, lesions or ulcers Psychiatry: Judgement and insight appear normal. Mood & affect appropriate.    DVT prophylaxis: SCDs due to thrombocytopenia. Code Status: Full Family Communication: Daughter was updated on phone. Disposition Plan: Pending improvement and completion of remdesivir.  He will go back home.  Consultants:   Cardiology  Hematology  Nephrology  Procedures:  Antimicrobials:   Data Reviewed: I have personally reviewed following labs and imaging studies  CBC: Recent Labs  Lab 08/23/19 1350 08/23/19 1602 08/24/19 0613  WBC 8.2 7.8 7.3  NEUTROABS  --  5.8  --   HGB 8.3* 8.2* 7.7*  HCT 25.7* 26.0* 24.6*  MCV 89.9 90.9 90.4  PLT 19* 20* 53*   Basic Metabolic Panel: Recent Labs  Lab 08/23/19 1350 08/23/19 1624 08/24/19 0613  NA 136  --  137  K 3.4*  --  4.2  CL 102  --  103  CO2 22  --  20*  GLUCOSE 140*  --  186*  BUN 43*  --  59*  CREATININE 5.33*  --  6.78*  CALCIUM 7.7*  --  7.7*  MG  --  2.1  --   PHOS  --  4.0  --    GFR: Estimated Creatinine Clearance: 14.6 mL/min (A) (by C-G formula based on SCr of 6.78 mg/dL (H)). Liver Function Tests: Recent Labs  Lab 08/23/19 1624 08/24/19 0613  AST 40 35   ALT 35 31  ALKPHOS 53 52  BILITOT 0.6 0.4  PROT 6.9 6.6  ALBUMIN 3.1* 2.9*   No results for input(s): LIPASE, AMYLASE in the last 168 hours. No results for input(s): AMMONIA in the last 168 hours. Coagulation Profile: No results for input(s): INR, PROTIME in the last 168 hours. Cardiac Enzymes: No results for input(s): CKTOTAL, CKMB, CKMBINDEX, TROPONINI in the last 168 hours. BNP (last 3 results) No results for input(s): PROBNP in the last 8760 hours. HbA1C: No results for input(s): HGBA1C in the last 72 hours. CBG: No results for input(s): GLUCAP in the last 168 hours. Lipid Profile: No results for input(s): CHOL, HDL, LDLCALC, TRIG, CHOLHDL, LDLDIRECT in the last 72 hours. Thyroid Function Tests: No results for input(s): TSH, T4TOTAL, FREET4, T3FREE, THYROIDAB in the last 72 hours. Anemia Panel: Recent Labs    08/23/19 1624  FERRITIN 264   Sepsis Labs: No results for input(s): PROCALCITON, LATICACIDVEN in the last 168 hours.  No results found for this or any previous visit (from the  past 240 hour(s)).   Radiology Studies: DG Chest 2 View  Result Date: 08/23/2019 CLINICAL DATA:  Shortness of breath EXAM: CHEST - 2 VIEW COMPARISON:  08/09/2019 FINDINGS: Right tunneled dialysis catheter is unchanged. No pleural effusion or pneumothorax. Stable cardiomediastinal contours. No new consolidation or edema. No acute osseous abnormality. IMPRESSION: No acute process in the chest. Electronically Signed   By: Macy Mis M.D.   On: 08/23/2019 14:18   ECHOCARDIOGRAM COMPLETE  Result Date: 08/24/2019    ECHOCARDIOGRAM REPORT   Patient Name:   OWENS HARA Date of Exam: 08/24/2019 Medical Rec #:  [id number]      Height:       68.0 in Accession #:    [id number]     Weight:       276.0 lb Date of Birth:  [birth year]      BSA:          2.344 m Patient Age:    84 years       BP:           114/56 mmHg Patient Gender: M              HR:           79 bpm. Exam Location:  [facility] Procedure:  Color Doppler and Cardiac Doppler Indications:     Elevated troponin  History:         Patient has no prior history of Echocardiogram examinations.                  Risk Factors:Hypertension and Sleep Apnea.  Sonographer:     Sherrie Sport RDCS (AE) Referring Phys:  [id number] Arvella Merles MANSY Diagnosing Phys: Kate Sable MD  Sonographer Comments: No apical window. IMPRESSIONS  1. Left ventricular ejection fraction, by estimation, is 60 to 65%. The left ventricle has normal function. The left ventricle has no regional wall motion abnormalities. There is mild left ventricular hypertrophy. Left ventricular diastolic function could not be evaluated.  2. Right ventricular systolic function is normal. The right ventricular size is normal.  3. The mitral valve is grossly normal. No evidence of mitral valve regurgitation.  4. The aortic valve is tricuspid. Aortic valve regurgitation is not visualized. Mild to moderate aortic valve sclerosis/calcification is present, without any evidence of aortic stenosis.  5. Aortic dilatation noted. There is mild dilatation of the aortic root measuring 42 mm. FINDINGS  Left Ventricle: Left ventricular ejection fraction, by estimation, is 60 to 65%. The left ventricle has normal function. The left ventricle has no regional wall motion abnormalities. The left ventricular internal cavity size was normal in size. There is  mild left ventricular hypertrophy. Left ventricular diastolic function could not be evaluated. Right Ventricle: The right ventricular size is normal. No increase in right ventricular wall thickness. Right ventricular systolic function is normal. Left Atrium: Left atrial size was normal in size. Right Atrium: Right atrial size was not well visualized. Pericardium: There is no evidence of pericardial effusion. Mitral Valve: The mitral valve is grossly normal. No evidence of mitral valve regurgitation. Tricuspid Valve: The tricuspid valve is grossly normal. Tricuspid valve  regurgitation is not demonstrated. Aortic Valve: The aortic valve is tricuspid. Aortic valve regurgitation is not visualized. Mild to moderate aortic valve sclerosis/calcification is present, without any evidence of aortic stenosis. Pulmonic Valve: The pulmonic valve was not well visualized. Pulmonic valve regurgitation is trivial. Aorta: Aortic dilatation noted. There is mild dilatation of the aortic root measuring 42 mm. Venous:  The inferior vena cava was not well visualized. IAS/Shunts: The interatrial septum was not well visualized.  LEFT VENTRICLE PLAX 2D LVIDd:         5.14 cm LVIDs:         2.65 cm LV PW:         1.39 cm LV IVS:        1.30 cm LVOT diam:     2.20 cm LVOT Area:     3.80 cm  LEFT ATRIUM         Index LA diam:    3.40 cm 1.45 cm/m                        PULMONIC VALVE AORTA                 PV Vmax:        0.89 m/s Ao Root diam: 4.10 cm PV Peak grad:   3.2 mmHg                       RVOT Peak grad: 14 mmHg  TRICUSPID VALVE TR Peak grad:   21.7 mmHg TR Vmax:        233.00 cm/s  SHUNTS Systemic Diam: 2.20 cm Kate Sable MD Electronically signed by Kate Sable MD Signature Date/Time: 08/24/2019/11:50:30 AM    Final     Scheduled Meds: . allopurinol  50 mg Oral QODAY  . aspirin EC  81 mg Oral Daily  . cholecalciferol  1,000 Units Oral Daily  . dexamethasone (DECADRON) injection  6 mg Intravenous Q24H  . diltiazem  240 mg Oral Daily  . furosemide  40 mg Intravenous BID  . [START ON 08/25/2019] influenza vac split quadrivalent PF  0.5 mL Intramuscular Tomorrow-1000  . midodrine  5 mg Oral Q T,Th,Sat-1800  . pyridOXINE  50 mg Oral Daily  . rosuvastatin  10 mg Oral Daily  . sodium chloride flush  3 mL Intravenous Q12H  . tamsulosin  0.4 mg Oral Daily   Continuous Infusions: . sodium chloride    . remdesivir 100 mg in NS 100 mL 100 mg (08/24/19 1058)     LOS: 0 days   Time spent: 45 minutes.  Lorella Nimrod, MD [facility]  If 7PM-7AM, please contact  night-coverage [email or web address]  08/24/2019, 1:06 PM   This record has been created using Systems analyst. Errors have been sought and corrected,but may not always be located. Such creation errors do not reflect on the standard of care.

## 2019-08-24 NOTE — Discharge Planning (Signed)
Hemodialysis patient known at [place] 12:00pm. Due to patient being COVID Positive patient will need to transfer to [place] [facility]. New temporary schedule will be TTS 8:00am. DC will inform patient of new plan for dialysis at discharge. Please keep DC informed of when patient will be discharge ready, so that [facility] can plan for patient to start.

## 2019-08-24 NOTE — Progress Notes (Signed)
Patient wears CPAP at home at night. Patient requested to have CPAP therapy while admitted. Informed patient that, due to Covid diagnosis, we would have to place him on our hospital unit equipped with the correct filters. Patient agreeable to therapy option. Placed on V-60 unit. Will continue to monitor.

## 2019-08-24 NOTE — Progress Notes (Signed)
[facility], [place] 08/24/19  Subjective:   LOS: 0 No intake/output data recorded.  Patient known to our practice from recent admission  in ou patient dialysis Patient was sent to the ER for evaluation of heart racing during dialysis.  Patient tested positive for Covid recently. Patient had heart rate in the 120.  He had partial dialysis treatment Today, he feels well.  Denies any nausea but reports diarrhea Feels tired and sleepy   Objective:  Vital signs in last 24 hours:  Temp:  [97.7 F (36.5 C)-99.9 F (37.7 C)] 97.7 F (36.5 C) (03/24 0958) Pulse Rate:  [69-117] 79 (03/24 0958) Resp:  [14-28] 17 (03/24 0958) BP: (93-131)/(28-73) 106/41 (03/24 0958) SpO2:  [92 %-100 %] 97 % (03/24 0958) Weight:  [125.2 kg] 125.2 kg (03/23 1350)  Weight change:  Filed Weights   08/23/19 1350  Weight: 125.2 kg    Intake/Output:    Intake/Output Summary (Last 24 hours) at 08/24/2019 1140 Last data filed at 08/24/2019 0920 Gross per 24 hour  Intake 608.33 ml  Output --  Net 608.33 ml     Physical Exam: General:  No acute distress, laying in the bed  HEENT  anicteric, moist oral mucous membrane  Pulm/lungs  supple, no masses, clear  CVS/Heart  no rub or gallop  Abdomen:   Soft, nontender  Extremities:  No peripheral edema  Neurologic:  Alert, oriented  Skin:  No acute rashes  Access:  PermCath       Basic Metabolic Panel:  Recent Labs  Lab 08/23/19 1350 08/23/19 1624 08/24/19 0613  NA 136  --  137  K 3.4*  --  4.2  CL 102  --  103  CO2 22  --  20*  GLUCOSE 140*  --  186*  BUN 43*  --  59*  CREATININE 5.33*  --  6.78*  CALCIUM 7.7*  --  7.7*  MG  --  2.1  --   PHOS  --  4.0  --      CBC: Recent Labs  Lab 08/23/19 1350 08/23/19 1602 08/24/19 0613  WBC 8.2 7.8 7.3  NEUTROABS  --  5.8  --   HGB 8.3* 8.2* 7.7*  HCT 25.7* 26.0* 24.6*  MCV 89.9 90.9 90.4  PLT 19* 20* 53*      Lab Results  Component Value Date   HEPBSAG  NON REACTIVE 08/08/2019   HEPBIGM NON REACTIVE 08/08/2019      Microbiology:  No results found for this or any previous visit (from the past 240 hour(s)).  Coagulation Studies: No results for input(s): LABPROT, INR in the last 72 hours.  Urinalysis: No results for input(s): COLORURINE, LABSPEC, PHURINE, GLUCOSEU, HGBUR, BILIRUBINUR, KETONESUR, PROTEINUR, UROBILINOGEN, NITRITE, LEUKOCYTESUR in the last 72 hours.  Invalid input(s): APPERANCEUR    Imaging: DG Chest 2 View  Result Date: 08/23/2019 CLINICAL DATA:  Shortness of breath EXAM: CHEST - 2 VIEW COMPARISON:  08/09/2019 FINDINGS: Right tunneled dialysis catheter is unchanged. No pleural effusion or pneumothorax. Stable cardiomediastinal contours. No new consolidation or edema. No acute osseous abnormality. IMPRESSION: No acute process in the chest. Electronically Signed   By: Macy Mis M.D.   On: 08/23/2019 14:18     Medications:   . sodium chloride    . remdesivir 100 mg in NS 100 mL 100 mg (08/24/19 1058)   . allopurinol  50 mg Oral QODAY  . aspirin EC  81 mg Oral Daily  . cholecalciferol  1,000 Units Oral Daily  . dexamethasone (DECADRON) injection  6 mg Intravenous Q24H  . diltiazem  240 mg Oral Daily  . furosemide  40 mg Intravenous BID  . [START ON 08/25/2019] influenza vac split quadrivalent PF  0.5 mL Intramuscular Tomorrow-1000  . midodrine  5 mg Oral Q T,Th,Sat-1800  . pyridOXINE  50 mg Oral Daily  . rosuvastatin  10 mg Oral Daily  . sodium chloride flush  3 mL Intravenous Q12H  . tamsulosin  0.4 mg Oral Daily   sodium chloride, acetaminophen **OR** acetaminophen, magnesium hydroxide, ondansetron **OR** ondansetron (ZOFRAN) IV, sodium chloride flush, traZODone  Assessment/ Plan:  64 y.o. 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  First dialysis August 09, 2019 Electrolytes and Volume status are acceptable No acute indication for Dialysis at present We will plan for dialysis tomorrow  #. Anemia of CKD  Lab Results  Component Value Date   HGB 7.7 (L) 08/24/2019  Low dose EPO with HD  #. SHPTH     Component Value Date/Time   PTH 349 (H) 08/08/2019 1413   Lab Results  Component Value Date   PHOS 4.0 08/23/2019   Monitor calcium and phos level during this admission  #. Diabetes type 2 with CKD Hgb A1c MFr Bld (%)  Date Value  08/08/2019 6.3 (H)  Diet controlled  #COVID-19 infection Symptomatic cough No dyspnea or hypoxia IV remdesivir and Decadron as per internal medicine team  #Thrombocytopenia Baseline platelet level 229 on March 11 Admission platelet level of 19 which has improved today HIT antibodies results pending Avoid Heparin at present   LOS: 0 Akash Winski Candiss Norse 3/24/202111:40 [place], [place]

## 2019-08-24 NOTE — TOC Initial Note (Addendum)
Transition of Care [facility]) - Initial/Assessment Note    Patient Details  Name: Peter Pearson MRN: [id number] Date of Birth: 1955/10/06  Transition of [facility]) CM/SW Contact:    Elease Hashimoto, LCSW Phone Number: 08/24/2019, 2:27 PM  Clinical Narrative: High risk screening.  Unable to reach pt so called daughter-Peter Pearson via telephone to obtain information. He lives with his wife who is not employed and can provide assist. Pt was coming her to [facility] for HD treatments T,T,S just started three weeks ago. He will need to go to [facility] now due to [place]. Was independent prior to admission until this admission. He has three children, two son's close by and daughter is 30 minutes away. All are involved and supportive. Has no equipment at home, was independent prior to admission. Wife can drive him also for HD treatments. Will follow along for any discharge needs. Pt has a PCP and insurance.                Expected Discharge Plan: Home/Self Care Barriers to Discharge: Continued Medical Work up   Patient Goals and CMS Choice Patient states their goals for this hospitalization and ongoing recovery are:: I want to go home but feel better      Expected Discharge Plan and Services Expected Discharge Plan: Home/Self Care In-house Referral: Clinical Social Work     Living arrangements for the past 2 months: Single Family Home                                      Prior Living Arrangements/Services Living arrangements for the past 2 months: Single Family Home Lives with:: Spouse Patient language and need for interpreter reviewed:: No        Need for Family Participation in Patient Care: Yes (Comment) Care giver support system in place?: Yes (comment)   Criminal Activity/Legal Involvement Pertinent to Current Situation/Hospitalization: No - Comment as needed  Activities of Daily Living Home Assistive Devices/Equipment: Blood pressure cuff ADL Screening (condition at time of  admission) Patient's cognitive ability adequate to safely complete daily activities?: Yes Is the patient deaf or have difficulty hearing?: No Does the patient have difficulty seeing, even when wearing glasses/contacts?: No Does the patient have difficulty concentrating, remembering, or making decisions?: No Patient able to express need for assistance with ADLs?: Yes Does the patient have difficulty dressing or bathing?: No Independently performs ADLs?: Yes (appropriate for developmental age) Does the patient have difficulty walking or climbing stairs?: No Weakness of Legs: None Weakness of Arms/Hands: None  Permission Sought/Granted   Permission granted to share information with : Yes, Verbal Permission Granted  Share Information with NAME: Peter Pearson     Permission granted to share info w Relationship: daughter     Emotional Assessment Appearance:: Appears stated age Attitude/Demeanor/Rapport: Engaged Affect (typically observed): Adaptable Orientation: : Oriented to Place, Oriented to  Time, Oriented to Self, Oriented to Situation   Psych Involvement: No (comment)  Admission diagnosis:  Tachycardia [R00.0] Thrombocytopenia (HCC) [D69.6] Elevated troponin [R77.8] COVID-19 [U07.1] COVID-19 virus infection [U07.1] Patient Active Problem List   Diagnosis Date Noted  . COVID-19 08/24/2019  . Elevated troponin   . Thrombocytopenia ([place])   . Tachycardia 08/23/2019  . ESRD (end stage renal disease) ([place]) 08/07/2019  . Essential hypertension 08/07/2019  . Anemia of chronic kidney failure, stage 5 ([place]) 08/07/2019   PCP:  System, Pcp Not In Pharmacy:  [id number] [place] [address] Phone: [phone] Fax: [phone]     Social Determinants of Health (SDOH) Interventions    Readmission Risk Interventions No flowsheet data found.

## 2019-08-24 NOTE — Consult Note (Signed)
Hematology/Oncology Consult note [facility] [phone] [phone]  Patient Care Team: System, Pcp Not In as PCP - General   Name of the patient: Michelle Vanhise  [id number]  1955/06/28   Date of visit: 08/24/19 REASON FOR COSULTATION:   History of presenting illness-  64 y.o. male with PMH listed including dyslipidemia, hypertension, OSA, ESRD on hemodialysis presented to emergency room with acute onset of tachycardia and hypoxia with a pulse ox of 88% on room air while he was on dialysis There is a report of cough with productive whitish sputum. Patient was tested positive for COVID-19. Patient is currently on BiPAP, limited history providing due to respiratory status. History obtained from reviewing of the chart. He denies any pain. Denies any bleeding events. Upon presentation to the emergency room, blood pressure is 107/57, heart rate 117. CBC showed acute decrease of platelet count to 19,000. Patient was admitted.  For Covid patient was started on IV remdesivir and also started on IV Decadron due to mildly elevated inflammatory marker.  Perfusion scan to rule out PE. Today patient's platelet count has improved to 53,000.  HIT panel has been sent   Review of Systems  Unable to perform ROS: Acuity of condition  Hematological: Does not bruise/bleed easily.    Allergies  Allergen Reactions  . Amoxicillin-Pot Clavulanate Rash    Patient Active Problem List   Diagnosis Date Noted  . Elevated troponin   . Tachycardia 08/23/2019  . ESRD (end stage renal disease) on dialysis ([place]) 08/07/2019  . Essential hypertension 08/07/2019  . Anemia of chronic kidney failure, stage 5 ([place]) 08/07/2019     Past Medical History:  Diagnosis Date  . Chronic kidney disease   . Hyperlipidemia   . Hypertension   . Sleep apnea      Past Surgical History:  Procedure Laterality Date  . AV FISTULA PLACEMENT Left 08/10/2019   Procedure: Left  Upper Extremity AV Fistula creation;  Surgeon: Algernon Huxley, MD;  Location: [facility] ORS;  Service: Vascular;  Laterality: Left;  . DIALYSIS/PERMA CATHETER INSERTION N/A 08/08/2019   Procedure: DIALYSIS/PERMA CATHETER INSERTION;  Surgeon: Algernon Huxley, MD;  Location: [place] [facility];  Service: Cardiovascular;  Laterality: N/A;    Social History   Socioeconomic History  . Marital status: Married    Spouse name: Shamim  . Number of children: 3  . Years of education: Not on file  . Highest education level: Not on file  Occupational History  . Not on file  Tobacco Use  . Smoking status: Former Smoker    Quit date: 08/06/1989    Years since quitting: 30.0  . Smokeless tobacco: Never Used  Substance and Sexual Activity  . Alcohol use: Never  . Drug use: Never  . Sexual activity: Not on file  Other Topics Concern  . Not on file  Social History Narrative  . Not on file   Social Determinants of Health   Financial Resource Strain:   . Difficulty of Paying Living Expenses:   Food Insecurity:   . Worried About Charity fundraiser in the Last Year:   . Arboriculturist in the Last Year:   Transportation Needs:   . Film/video editor (Medical):   Marland Kitchen Lack of Transportation (Non-Medical):   Physical Activity:   . Days of Exercise per Week:   . Minutes of Exercise per Session:   Stress:   . Feeling of Stress :   Social Connections:   .  Frequency of Communication with Friends and Family:   . Frequency of Social Gatherings with Friends and Family:   . Attends Religious Services:   . Active Member of Clubs or Organizations:   . Attends Archivist Meetings:   Marland Kitchen Marital Status:   Intimate Partner Violence:   . Fear of Current or Ex-Partner:   . Emotionally Abused:   Marland Kitchen Physically Abused:   . Sexually Abused:      Family History  Problem Relation Age of Onset  . Heart disease Father      Current Facility-Administered Medications:  .  0.9 %  sodium chloride infusion,  250 mL, Intravenous, PRN, Mansy, Jan A, MD .  acetaminophen (TYLENOL) tablet 650 mg, 650 mg, Oral, Q6H PRN **OR** acetaminophen (TYLENOL) suppository 650 mg, 650 mg, Rectal, Q6H PRN, Mansy, Jan A, MD .  allopurinol (ZYLOPRIM) tablet 50 mg, 50 mg, Oral, QODAY, Mansy, Jan A, MD, 50 mg at 08/24/19 1043 .  aspirin EC tablet 81 mg, 81 mg, Oral, Daily, Mansy, Jan A, MD, 81 mg at 08/24/19 1043 .  cholecalciferol (VITAMIN D3) tablet 1,000 Units, 1,000 Units, Oral, Daily, Mansy, Jan A, MD, 1,000 Units at 08/24/19 1043 .  dexamethasone (DECADRON) injection 6 mg, 6 mg, Intravenous, Q24H, Mansy, Jan A, MD, 6 mg at 08/24/19 0055 .  diltiazem (CARDIZEM CD) 24 hr capsule 240 mg, 240 mg, Oral, Daily, Mansy, Jan A, MD, 240 mg at 08/24/19 1043 .  furosemide (LASIX) injection 40 mg, 40 mg, Intravenous, BID, Mansy, Jan A, MD .  Derrill Memo ON 08/25/2019] influenza vac split quadrivalent PF (FLUARIX) injection 0.5 mL, 0.5 mL, Intramuscular, Tomorrow-1000, Amin, Sumayya, MD .  magnesium hydroxide (MILK OF MAGNESIA) suspension 30 mL, 30 mL, Oral, Daily PRN, Mansy, Jan A, MD .  midodrine (PROAMATINE) tablet 5 mg, 5 mg, Oral, Q T,Th,Sat-1800, Mansy, Jan A, MD .  ondansetron (ZOFRAN) tablet 4 mg, 4 mg, Oral, Q6H PRN **OR** ondansetron (ZOFRAN) injection 4 mg, 4 mg, Intravenous, Q6H PRN, Mansy, Jan A, MD .  pyridOXINE (VITAMIN B-6) tablet 50 mg, 50 mg, Oral, Daily, Mansy, Jan A, MD, 50 mg at 08/24/19 1043 .  [COMPLETED] remdesivir 200 mg in sodium chloride 0.9% 250 mL IVPB, 200 mg, Intravenous, Once, Last Rate: 580 mL/hr at 08/24/19 0109, 200 mg at 08/24/19 0109 **FOLLOWED BY** remdesivir 100 mg in sodium chloride 0.9 % 100 mL IVPB, 100 mg, Intravenous, Daily, Mansy, Jan A, MD, Last Rate: 200 mL/hr at 08/24/19 1058, 100 mg at 08/24/19 1058 .  rosuvastatin (CRESTOR) tablet 10 mg, 10 mg, Oral, Daily, Mansy, Jan A, MD .  sodium chloride flush (NS) 0.9 % injection 3 mL, 3 mL, Intravenous, Q12H, Mansy, Jan A, MD, 3 mL at 08/24/19 1050 .   sodium chloride flush (NS) 0.9 % injection 3 mL, 3 mL, Intravenous, PRN, Mansy, Jan A, MD, 3 mL at 08/24/19 1043 .  tamsulosin (FLOMAX) capsule 0.4 mg, 0.4 mg, Oral, Daily, Mansy, Jan A, MD, 0.4 mg at 08/24/19 1043 .  traZODone (DESYREL) tablet 25 mg, 25 mg, Oral, QHS PRN, Mansy, Arvella Merles, MD   Physical exam:  Vitals:   08/24/19 0621 08/24/19 0900 08/24/19 0929 08/24/19 0958  BP: (!) 114/56 (!) 93/28 (!) 117/47 (!) 106/41  Pulse: 79 69 85 79  Resp: (!) 28  20 17   Temp: 98.8 F (37.1 C)   97.7 F (36.5 C)  TempSrc: Oral   Oral  SpO2: 96% 97% 100% 97%  Weight:  Height:       Physical Exam  Constitutional: No distress.  HENT:  Head: Normocephalic and atraumatic.  Eyes: No scleral icterus.  Pulmonary/Chest: Effort normal.  On BiPAP  Abdominal: Soft. Bowel sounds are normal.  Musculoskeletal:     Cervical back: Neck supple.  Neurological: He is alert.  Skin: Skin is warm.        CMP Latest Ref Rng & Units 08/24/2019  Glucose 70 - 99 mg/dL 186(H)  BUN 8 - 23 mg/dL 59(H)  Creatinine 0.61 - 1.24 mg/dL 6.78(H)  Sodium 135 - 145 mmol/L 137  Potassium 3.5 - 5.1 mmol/L 4.2  Chloride 98 - 111 mmol/L 103  CO2 22 - 32 mmol/L 20(L)  Calcium 8.9 - 10.3 mg/dL 7.7(L)  Total Protein 6.5 - 8.1 g/dL 6.6  Total Bilirubin 0.3 - 1.2 mg/dL 0.4  Alkaline Phos 38 - 126 U/L 52  AST 15 - 41 U/L 35  ALT 0 - 44 U/L 31   CBC Latest Ref Rng & Units 08/24/2019  WBC 4.0 - 10.5 K/uL 7.3  Hemoglobin 13.0 - 17.0 g/dL 7.7(L)  Hematocrit 39.0 - 52.0 % 24.6(L)  Platelets 150 - 400 K/uL 53(L)    RADIOGRAPHIC STUDIES: I have personally reviewed the radiological images as listed and agreed with the findings in the report. DG Chest 2 View  Result Date: 08/23/2019 CLINICAL DATA:  Shortness of breath EXAM: CHEST - 2 VIEW COMPARISON:  08/09/2019 FINDINGS: Right tunneled dialysis catheter is unchanged. No pleural effusion or pneumothorax. Stable cardiomediastinal contours. No new consolidation or edema.  No acute osseous abnormality. IMPRESSION: No acute process in the chest. Electronically Signed   By: Macy Mis M.D.   On: 08/23/2019 14:18   DG Chest 2 View  Result Date: 08/09/2019 CLINICAL DATA:  Shortness of breath EXAM: CHEST - 2 VIEW COMPARISON:  08/07/2018 FINDINGS: Cardiac shadow is prominent but stable. New right jugular central line is noted in satisfactory position. No pneumothorax is seen. No focal infiltrate is seen. No bony abnormality is noted. IMPRESSION: No acute abnormality noted. Electronically Signed   By: Inez Catalina M.D.   On: 08/09/2019 15:55   PERIPHERAL VASCULAR CATHETERIZATION  Result Date: 08/08/2019 See op note  DG Chest Port 1 View  Result Date: 08/07/2019 CLINICAL DATA:  Shortness of breath for a month, denies chest pain and cough EXAM: PORTABLE CHEST 1 VIEW COMPARISON:  Radiograph 06/08/2018, 10/12/2000 FINDINGS: Slightly bulbous configuration of the right main pulmonary artery, could reflect some right hilar adenopathy or pulmonary arterial congestion. No consolidative opacity is seen. Lung volumes are low with hazy interstitial changes and cephalized vascular markings. Cardiac silhouette is upper limits normal. No acute osseous or soft tissue abnormality. Telemetry leads overlie the chest. IMPRESSION: Features of central vascular congestion. Convex appearance of the right main pulmonary artery/hilum, nonspecific and possibly artifactual but could reflect hilar adenopathy, consider CT imaging. Electronically Signed   By: Lovena Le M.D.   On: 08/07/2019 15:16   ECHOCARDIOGRAM COMPLETE  Result Date: 08/24/2019    ECHOCARDIOGRAM REPORT   Patient Name:   SAHAND GOSCH Date of Exam: 08/24/2019 Medical Rec #:  [id number]      Height:       68.0 in Accession #:    [id number]     Weight:       276.0 lb Date of Birth:  [birth year]      BSA:          2.344 m Patient Age:    6  years       BP:           114/56 mmHg Patient Gender: M              HR:           79 bpm. Exam  Location:  [facility] Procedure: Color Doppler and Cardiac Doppler Indications:     Elevated troponin  History:         Patient has no prior history of Echocardiogram examinations.                  Risk Factors:Hypertension and Sleep Apnea.  Sonographer:     Sherrie Sport RDCS (AE) Referring Phys:  [id number] Arvella Merles MANSY Diagnosing Phys: Kate Sable MD  Sonographer Comments: No apical window. IMPRESSIONS  1. Left ventricular ejection fraction, by estimation, is 60 to 65%. The left ventricle has normal function. The left ventricle has no regional wall motion abnormalities. There is mild left ventricular hypertrophy. Left ventricular diastolic function could not be evaluated.  2. Right ventricular systolic function is normal. The right ventricular size is normal.  3. The mitral valve is grossly normal. No evidence of mitral valve regurgitation.  4. The aortic valve is tricuspid. Aortic valve regurgitation is not visualized. Mild to moderate aortic valve sclerosis/calcification is present, without any evidence of aortic stenosis.  5. Aortic dilatation noted. There is mild dilatation of the aortic root measuring 42 mm. FINDINGS  Left Ventricle: Left ventricular ejection fraction, by estimation, is 60 to 65%. The left ventricle has normal function. The left ventricle has no regional wall motion abnormalities. The left ventricular internal cavity size was normal in size. There is  mild left ventricular hypertrophy. Left ventricular diastolic function could not be evaluated. Right Ventricle: The right ventricular size is normal. No increase in right ventricular wall thickness. Right ventricular systolic function is normal. Left Atrium: Left atrial size was normal in size. Right Atrium: Right atrial size was not well visualized. Pericardium: There is no evidence of pericardial effusion. Mitral Valve: The mitral valve is grossly normal. No evidence of mitral valve regurgitation. Tricuspid Valve: The tricuspid valve is grossly  normal. Tricuspid valve regurgitation is not demonstrated. Aortic Valve: The aortic valve is tricuspid. Aortic valve regurgitation is not visualized. Mild to moderate aortic valve sclerosis/calcification is present, without any evidence of aortic stenosis. Pulmonic Valve: The pulmonic valve was not well visualized. Pulmonic valve regurgitation is trivial. Aorta: Aortic dilatation noted. There is mild dilatation of the aortic root measuring 42 mm. Venous: The inferior vena cava was not well visualized. IAS/Shunts: The interatrial septum was not well visualized.  LEFT VENTRICLE PLAX 2D LVIDd:         5.14 cm LVIDs:         2.65 cm LV PW:         1.39 cm LV IVS:        1.30 cm LVOT diam:     2.20 cm LVOT Area:     3.80 cm  LEFT ATRIUM         Index LA diam:    3.40 cm 1.45 cm/m                        PULMONIC VALVE AORTA                 PV Vmax:        0.89 m/s Ao Root diam: 4.10 cm PV Peak grad:  3.2 mmHg                       RVOT Peak grad: 14 mmHg  TRICUSPID VALVE TR Peak grad:   21.7 mmHg TR Vmax:        233.00 cm/s  SHUNTS Systemic Diam: 2.20 cm Kate Sable MD Electronically signed by Kate Sable MD Signature Date/Time: 08/24/2019/11:50:30 AM    Final     Assessment and plan- Patient is a 64 y.o. male presented due to hypoxia, tachycardia and was tested for Covid positive. Initial blood work showed acute drop of platelet count to 19,000.  #Thrombocytopenia, platelet was 19,000. Patient was discharged on 08/11/2019 at the time patient has complete normal platelet counts 229. Check immature platelet fraction-inappropriately normal today?   Likely bone marrow suppression due to acute infection. Patient has been treated empirically with IV Decadron for Covid 19 infection and today platelet count has improved. 4 T score is 2, low probability for HIT.  Panel has been sent results is pending.  Obtain peripheral blood smear. Continue CBC daily for monitoring.  #Normocytic anemia, likely  secondary to chronic kidney disease. Ferritin level is 264. Patient is on hemodialysis.  Recommend erythropoietin with hemodialysis.  #COVID-19 infection, symptomatic cough. IV remdesivir and Decadron per hospitalist team.    Thank you for allowing me to participate in the care of this patient.   Earlie Server, MD, PhD Hematology Oncology [facility] Pager- [phone] 08/24/2019

## 2019-08-24 NOTE — Progress Notes (Signed)
*  PRELIMINARY RESULTS* Echocardiogram 2D Echocardiogram has been performed.  Sherrie Sport 08/24/2019, 9:00 AM

## 2019-08-25 LAB — FOLATE RBC
Folate, Hemolysate: 372 ng/mL
Folate, RBC: 1590 ng/mL (ref >498)
Hematocrit: 23.4 % — ABNORMAL LOW (ref 37.5–51.0)

## 2019-08-25 LAB — COMPREHENSIVE METABOLIC PANEL
ALT: 31 U/L (ref 0–44)
AST: 37 U/L (ref 15–41)
Albumin: 2.6 g/dL — ABNORMAL LOW (ref 3.5–5.0)
Alkaline Phosphatase: 45 U/L (ref 38–126)
Anion gap: 13 (ref 5–15)
BUN: 86 mg/dL — ABNORMAL HIGH (ref 8–23)
CO2: 19 mmol/L — ABNORMAL LOW (ref 22–32)
Calcium: 7.8 mg/dL — ABNORMAL LOW (ref 8.9–10.3)
Chloride: 103 mmol/L (ref 98–111)
Creatinine, Ser: 7.4 mg/dL — ABNORMAL HIGH (ref 0.61–1.24)
GFR calc Af Amer: 8 mL/min — ABNORMAL LOW (ref >60)
GFR calc non Af Amer: 7 mL/min — ABNORMAL LOW (ref >60)
Glucose, Bld: 204 mg/dL — ABNORMAL HIGH (ref 70–99)
Potassium: 4.2 mmol/L (ref 3.5–5.1)
Sodium: 135 mmol/L (ref 135–145)
Total Bilirubin: 0.4 mg/dL (ref 0.3–1.2)
Total Protein: 6 g/dL — ABNORMAL LOW (ref 6.5–8.1)

## 2019-08-25 LAB — BPAM PLATELET PHERESIS
Blood Product Expiration Date: 202103252359
Blood Product Expiration Date: 202103252359
ISSUE DATE / TIME: 202103240331
ISSUE DATE / TIME: 202103240527
Unit Type and Rh: 6200
Unit Type and Rh: 7300

## 2019-08-25 LAB — TROPONIN I (HIGH SENSITIVITY): Troponin I (High Sensitivity): 111 ng/L (ref <18)

## 2019-08-25 LAB — PREPARE PLATELET PHERESIS
Unit division: 0
Unit division: 0

## 2019-08-25 LAB — GLUCOSE, CAPILLARY
Glucose-Capillary: 248 mg/dL — ABNORMAL HIGH (ref 70–99)
Glucose-Capillary: 184 mg/dL — ABNORMAL HIGH (ref 70–99)
Glucose-Capillary: 331 mg/dL — ABNORMAL HIGH (ref 70–99)
Glucose-Capillary: 362 mg/dL — ABNORMAL HIGH (ref 70–99)
Glucose-Capillary: 190 mg/dL — ABNORMAL HIGH (ref 70–99)

## 2019-08-25 LAB — PHOSPHORUS: Phosphorus: 7.1 mg/dL — ABNORMAL HIGH (ref 2.5–4.6)

## 2019-08-25 LAB — CBC WITH DIFFERENTIAL/PLATELET
Abs Immature Granulocytes: 0.06 10*3/uL (ref 0.00–0.07)
Basophils Absolute: 0 10*3/uL (ref 0.0–0.1)
Basophils Relative: 0 %
Eosinophils Absolute: 0 10*3/uL (ref 0.0–0.5)
Eosinophils Relative: 0 %
HCT: 22.4 % — ABNORMAL LOW (ref 39.0–52.0)
Hemoglobin: 7.2 g/dL — ABNORMAL LOW (ref 13.0–17.0)
Immature Granulocytes: 1 %
Lymphocytes Relative: 13 %
Lymphs Abs: 0.9 10*3/uL (ref 0.7–4.0)
MCH: 28.5 pg (ref 26.0–34.0)
MCHC: 32.1 g/dL (ref 30.0–36.0)
MCV: 88.5 fL (ref 80.0–100.0)
Monocytes Absolute: 0.2 10*3/uL (ref 0.1–1.0)
Monocytes Relative: 3 %
Neutro Abs: 5.3 10*3/uL (ref 1.7–7.7)
Neutrophils Relative %: 83 %
Platelets: 64 10*3/uL — ABNORMAL LOW (ref 150–400)
RBC: 2.53 MIL/uL — ABNORMAL LOW (ref 4.22–5.81)
RDW: 13.4 % (ref 11.5–15.5)
WBC: 6.4 10*3/uL (ref 4.0–10.5)
nRBC: 0 % (ref 0.0–0.2)

## 2019-08-25 LAB — HEPARIN INDUCED PLATELET AB (HIT ANTIBODY): Heparin Induced Plt Ab: 2.625 OD — ABNORMAL HIGH (ref 0.000–0.400)

## 2019-08-25 LAB — FIBRIN DERIVATIVES D-DIMER (ARMC ONLY): Fibrin derivatives D-dimer (ARMC): >7500 ng/mL (FEU) — ABNORMAL HIGH (ref 0.00–499.00)

## 2019-08-25 LAB — FERRITIN: Ferritin: 392 ng/mL — ABNORMAL HIGH (ref 24–336)

## 2019-08-25 LAB — MAGNESIUM: Magnesium: 2.1 mg/dL (ref 1.7–2.4)

## 2019-08-25 LAB — C-REACTIVE PROTEIN: CRP: 12.4 mg/dL — ABNORMAL HIGH (ref <1.0)

## 2019-08-25 LAB — PREPARE RBC (CROSSMATCH)

## 2019-08-25 MED ORDER — CHLORHEXIDINE GLUCONATE CLOTH 2 % EX PADS
6.0000 | MEDICATED_PAD | Freq: Every day | CUTANEOUS | Status: DC
  Administered 2019-08-25 – 2019-08-27 (×3): 6 via TOPICAL

## 2019-08-25 MED ORDER — SODIUM CHLORIDE 0.9% IV SOLUTION: Freq: Once | INTRAVENOUS | Status: AC

## 2019-08-25 MED ORDER — EPOETIN ALFA 4000 UNIT/ML IJ SOLN
4000.0000 [IU] | INTRAMUSCULAR | Status: DC
  Administered 2019-08-25 – 2019-08-27 (×2): 4000 [IU] via INTRAVENOUS
  Filled 2019-08-25 (×2): qty 1

## 2019-08-25 NOTE — Progress Notes (Signed)
HD Tx started w/o complication    75/05/18 0920  Vital Signs  Pulse Rate 65  Pulse Rate Source Monitor  BP (!) 103/53  BP Location Right Arm  BP Method Automatic  Patient Position (if appropriate) Lying  Oxygen Therapy  SpO2 100 %  Pulse Oximetry Type Continuous  During Hemodialysis Assessment  Blood Flow Rate (mL/min) 400 mL/min  Arterial Pressure (mmHg) -190 mmHg  Venous Pressure (mmHg) 140 mmHg  Transmembrane Pressure (mmHg) 60 mmHg  Ultrafiltration Rate (mL/min) 330 mL/min  Dialysate Flow Rate (mL/min) 600 ml/min  Conductivity: Machine  14  HD Safety Checks Performed Yes  Dialysis Fluid Bolus Normal Saline  Bolus Amount (mL) 250 mL  Intra-Hemodialysis Comments Tx initiated

## 2019-08-25 NOTE — Progress Notes (Signed)
HD Tx completed, tolerated well. Clotting toward end of [place], lost apprx 15 min tx time.    08/25/19 1200  Hand-Off documentation  Report given to (Full Name) Peter Milian, RN   Report received from (Full Name) Beatris Ship, RN   Vital Signs  Temp 97.8 F (36.6 C)  Temp Source Oral  Pulse Rate 81  Pulse Rate Source Monitor  Resp 20  BP (!) 106/45  BP Location Right Arm  BP Method Automatic  Patient Position (if appropriate) Lying  Oxygen Therapy  SpO2 99 %  O2 Device Room Air  Pulse Oximetry Type Continuous  Pain Assessment  Pain Scale 0-10  Pain Score 0  Dialysis Weight  Weight 125.6 kg  Type of Weight Post-Dialysis  Post-Hemodialysis Assessment  Rinseback Volume (mL) 250 mL  KECN 43.3 V  Dialyzer Clearance Clotted  Duration of HD Treatment -hour(s) 2.25 hour(s)  Hemodialysis Intake (mL) 1000 mL  UF Total -Machine (mL) 1000 mL  Net UF (mL) 0 mL  Tolerated HD Treatment Yes  Hemodialysis Catheter Right Internal jugular Double lumen Permanent (Tunneled)  Placement Date/Time: 08/08/19 1000   Time Out: Correct patient;Correct site;Correct procedure  Maximum sterile barrier precautions: Hand hygiene;Large sterile sheet;Cap;Mask;Sterile gown;Sterile gloves  Site Prep: Chlorhexidine (preferred)  Ultrasound...  Site Condition No complications  Blue Lumen Status Saline locked  Red Lumen Status Saline locked  Post treatment catheter status Capped and Clamped

## 2019-08-25 NOTE — Progress Notes (Signed)
Pre HD Tx   08/25/19 0915  Hand-Off documentation  Report given to (Full Name) Beatris Ship, RN   Report received from (Full Name) Candi Leash, RN   Vital Signs  Temp 97.8 F (36.6 C)  Temp Source Oral  Pulse Rate 67  Pulse Rate Source Monitor  BP (!) 102/48  BP Location Right Arm  BP Method Automatic  Patient Position (if appropriate) Lying  Oxygen Therapy  SpO2 100 %  Pulse Oximetry Type Continuous  Pain Assessment  Pain Scale 0-10  Pain Score 0  Dialysis Weight  Weight 125.2 kg  Type of Weight Pre-Dialysis  Time-Out for Hemodialysis  What Procedure? HD  Pt Identifiers(min of two) First/Last Name;MRN/Account#  Correct Site? Yes  Correct Side? Yes  Correct Procedure? Yes  Consents Verified? Yes  Rad Studies Available? N/A  Safety Precautions Reviewed? Yes  Engineer, civil (consulting) Number 4  Station Number 121  UF/Alarm Test Passed  Conductivity: Meter 14  Conductivity: Machine  14.1  pH 7.2  Reverse Osmosis WRO 4  Normal Saline Lot Number [id number]  Dialyzer Lot Number [id number]  Disposable Set Lot Number [id number]  Machine Temperature 98.6 F (37 C)  Musician and Audible Yes  Blood Lines Intact and Secured Yes  Pre Treatment Patient Checks  Vascular access used during treatment Catheter  HD catheter dressing before treatment WDL  Hepatitis B Surface Antigen Results Negative  Date Hepatitis B Surface Antigen Drawn 08/16/19  Isolation Initiated Yes  Hepatitis B Surface Antibody  (<10)  Date Hepatitis B Surface Antibody Drawn 08/16/19  Hemodialysis Consent Verified Yes  Hemodialysis Standing Orders Initiated Yes  ECG (Telemetry) Monitor On Yes  Prime Ordered Normal Saline  Length of  DialysisTreatment -hour(s) 2.5 Hour(s)  Dialyzer Elisio 17H NR  Dialysate 3K;2.5 Ca  Dialysis Anticoagulant None  Dialysate Flow Ordered 600  Blood Flow Rate Ordered 400 mL/min  Ultrafiltration Goal 0.5 Liters  Dialysis Blood Pressure Support Ordered Normal Saline   Education / Care Plan  Dialysis Education Provided Yes  Hemodialysis Catheter Right Internal jugular Double lumen Permanent (Tunneled)  Placement Date/Time: 08/08/19 1000   Time Out: Correct patient;Correct site;Correct procedure  Maximum sterile barrier precautions: Hand hygiene;Large sterile sheet;Cap;Mask;Sterile gown;Sterile gloves  Site Prep: Chlorhexidine (preferred)  Ultrasound...  Site Condition No complications  Blue Lumen Status Blood return noted  Red Lumen Status Blood return noted  Purple Lumen Status N/A  Dressing Status Clean;Dry;Intact

## 2019-08-25 NOTE — Progress Notes (Signed)
Pre HD tx    08/25/19 0910  Neurological  Level of Consciousness Alert  Orientation Level Oriented X4  Respiratory  Respiratory Pattern Regular;Unlabored  Chest Assessment Chest expansion symmetrical  Bilateral Breath Sounds Diminished;Clear  Cough None  Cardiac  Pulse Regular  Heart Sounds S1, S2  ECG Monitor Yes  Cardiac Rhythm Heart block  Heart Block Type 1st degree AVB  Vascular  R Radial Pulse +2  L Radial Pulse +2  Edema Generalized  Psychosocial  Psychosocial (WDL) WDL

## 2019-08-25 NOTE — Progress Notes (Signed)
PROGRESS NOTE    Peter Pearson  SHF:[id number] DOB: 01-26-1956 DOA: 08/23/2019 PCP: System, Pcp Not In   Brief Narrative:  Peter Pearson  is a 64 y.o. 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  He had a short 1-1/2-hour out of 4-hour session secondarily.  His heart rate was up to 120.  He admitted to cough productive of whitish sputum.  He tested positive for COVID-19 on Friday.  He denies any chest pain or dyspnea but did had some diarrhea a day prior to admission.  No loss of taste or smell. Chest x-ray without any acute cardiopulmonary disease.  Labs significant for platelet of 19 which was 229 on 08/11/2019. No sign of any active bleeding and elevated troponin.  Markedly elevated inflammatory markers.  Subjective:  Denies any chest pain or shortness of breath.  He was getting his dialysis.  Continue to feel little weak.  Assessment & Plan:   Active Problems:   ESRD (end stage renal disease) (HCC)   Tachycardia   Elevated troponin   COVID-19   Thrombocytopenia (HCC)   Mild sinus tachycardia with brief episode of hypoxia that resolved, with elevated troponin.  No chest pain or persistent dyspnea.  Troponin curve mostly flat.  Most likely secondary to demand.  Echocardiogram was with normal EF and no wall motion abnormalities.  Cardiology was consulted from ED and they are not considering any further work-up at this time. -Perfusion studies were negative for any perfusion defect.  COVID-19 infection.  Apparently patient was diagnosed on Friday with COVID-19.  Having some cough but denies any fever, chills or dyspnea. Transient hypoxia which improves, now saturating  well on room air. Markedly elevated inflammatory markers.  He was started on remdesivir and Decadron. -Continue remdesivir-3/5 days. -Continue Decadron-3/10 days. -Continue supportive care with antitussives and vitamins supplement. -Continue to monitor inflammatory markers. -NM perfusion studies negative for perfusion defects.  Thrombocytopenia.  Patient with severe thrombocytopenia with platelet of 19 on admission.  It was 229 on 08/11/2019 during his discharge with prior admission. Less possibility of HIT.  Most likely secondary to COVID-19 infection. Hematology was consulted from ED, his fraction for immature platelets is inappropriately normal which shows bone marrow suppression most likely secondary to acute infection. HIT labs were ordered by hematology-pending results. He was given 2 units of platelets overnight with improvement to 64,000 today. No enough active bleeding. -Continue to monitor.  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  Recently started on dialysis.  Nephrology was consulted from ED. -Dialysis per nephrology.  Type 2 diabetes.  Well-controlled with A1c of 6.3 on/08/08/2019. CBG elevated as patient is on steroid now. -Start him on renally dosed insulin with Levemir, and NovoLog.  Normocytic anemia.  Likely secondary to renal disease.  Hemoglobin down to 7.2 today from baseline close to 9.  No obvious bleeding. -Iron studies consistent with anemia of chronic illness with decreased saturation, decreased reticulocyte count more consistent with bone marrow suppression. -Give 1 unit PRBC with dialysis today. - EPO with dialysis. -Continue to monitor.  Dyslipidemia. -Continue statin.  History of gout.  No acute flare. -Continue home dose of allopurinol.  BPH.  -Continue home dose  of Flomax.  Obstructive sleep apnea. -CPAP at bedtime.  Objective: Vitals:   08/25/19 1145 08/25/19 1148 08/25/19 1200 08/25/19 1234  BP: (!) 96/59 (!) 106/40 (!) 106/45  (!) 120/52  Pulse: 73 75 81 76  Resp: (!) 25 20 20    Temp: 97.8 F (36.6 C)  97.8 F (36.6 C)   TempSrc: Oral  Oral   SpO2: 98% 97% 99%   Weight:   125.6 kg   Height:        Intake/Output Summary (Last 24 hours) at 08/25/2019 1504 Last data filed at 08/25/2019 1200 Gross per 24 hour  Intake 382.33 ml  Output 0 ml  Net 382.33 ml   Filed Weights   08/23/19 1350 08/25/19 0915 08/25/19 1200  Weight: 125.2 kg 125.2 kg 125.6 kg    Examination:  General exam: Well-developed, obese gentleman, appears calm and comfortable  Respiratory system: Clear to auscultation. Respiratory effort normal. Cardiovascular system: S1 & S2 heard, RRR. No JVD, murmurs, rubs, gallops or clicks. Gastrointestinal system: Soft, nontender, nondistended, bowel sounds positive. Central nervous system: Alert and oriented. No focal neurological deficits.Symmetric 5 x 5 power. Extremities: No edema, no cyanosis, pulses intact and symmetrical. Skin: No rashes, lesions or ulcers Psychiatry: Judgement and insight appear normal. Mood & affect appropriate.    DVT prophylaxis: SCDs due to thrombocytopenia. Code Status: Full Family Communication: Daughter was updated on phone. Disposition Plan: Pending improvement and completion of remdesivir.  He will go back home.  Consultants:   Cardiology  Hematology  Nephrology  Procedures:  Antimicrobials:   Data Reviewed: I have personally reviewed following labs and imaging studies  CBC: Recent Labs  Lab 08/23/19 1350 08/23/19 1602 08/24/19 0613 08/25/19 0544  WBC 8.2 7.8 7.3 6.4  NEUTROABS  --  5.8  --  5.3  HGB 8.3* 8.2* 7.7* 7.2*  HCT 25.7* 26.0* 24.6* 22.4*  MCV 89.9 90.9 90.4 88.5  PLT 19* 20* 53* 64*   Basic Metabolic Panel: Recent Labs  Lab 08/23/19 1350 08/23/19 1624 08/24/19 0613 08/25/19 0544  NA 136  --  137 135  K 3.4*  --  4.2 4.2  CL 102  --  103 103  CO2 22  --  20* 19*  GLUCOSE 140*  --  186* 204*  BUN 43*  --  59* 86*   CREATININE 5.33*  --  6.78* 7.40*  CALCIUM 7.7*  --  7.7* 7.8*  MG  --  2.1  --  2.1  PHOS  --  4.0  --  7.1*   GFR: Estimated Creatinine Clearance: 13.4 mL/min (A) (by C-G formula based on SCr of 7.4 mg/dL (H)). Liver Function Tests: Recent Labs  Lab 08/23/19 1624 08/24/19 0613 08/25/19 0544  AST 40 35 37  ALT 35 31 31  ALKPHOS 53 52 45  BILITOT 0.6 0.4 0.4  PROT 6.9 6.6 6.0*  ALBUMIN 3.1* 2.9* 2.6*   No results for input(s): LIPASE, AMYLASE in the last 168 hours. No results for input(s): AMMONIA in the last 168 hours. Coagulation Profile: No results for input(s): INR, PROTIME in the last 168 hours. Cardiac Enzymes: No results for input(s): CKTOTAL, CKMB, CKMBINDEX, TROPONINI in the last 168 hours. BNP (last 3 results) No results for input(s): PROBNP in the last 8760 hours. HbA1C: No results for input(s): HGBA1C in the last 72 hours. CBG: Recent Labs  Lab 08/24/19 1558 08/24/19 2113 08/25/19 0800 08/25/19 1152  GLUCAP 218* 248* 184* 331*   Lipid Profile: No results for input(s):  CHOL, HDL, LDLCALC, TRIG, CHOLHDL, LDLDIRECT in the last 72 hours. Thyroid Function Tests: No results for input(s): TSH, T4TOTAL, FREET4, T3FREE, THYROIDAB in the last 72 hours. Anemia Panel: Recent Labs    08/23/19 1624 08/24/19 1358 08/25/19 0544  VITAMINB12  --  1,171*  --   FERRITIN 264  --  392*  TIBC  --  228*  --   IRON  --  13*  --   RETICCTPCT  --  0.7  --    Sepsis Labs: No results for input(s): PROCALCITON, LATICACIDVEN in the last 168 hours.  No results found for this or any previous visit (from the past 240 hour(s)).   Radiology Studies: NM Pulmonary Perfusion  Result Date: 08/24/2019 CLINICAL DATA:  Hypoxia EXAM: NUCLEAR MEDICINE PERFUSION LUNG SCAN TECHNIQUE: Perfusion images were obtained in multiple projections after intravenous injection of radiopharmaceutical. Ventilation scans intentionally deferred if perfusion scan and chest x-ray adequate for  interpretation during COVID 19 epidemic. Views: Anterior, posterior, left lateral, right lateral, RPO, LPO, RAO, LAO RADIOPHARMACEUTICALS:  3.84 mCi Tc-74m MAA IV COMPARISON:  Chest radiograph August 23, 2019 FINDINGS: Radiotracer uptake is homogeneous and symmetric bilaterally. No evident perfusion defects. IMPRESSION: No perfusion defects.  Very low probability of pulmonary embolus. Electronically Signed   By: Lowella Grip III M.D.   On: 08/24/2019 13:56   ECHOCARDIOGRAM COMPLETE  Result Date: 08/24/2019    ECHOCARDIOGRAM REPORT   Patient Name:   VALERIAN JEWEL Date of Exam: 08/24/2019 Medical Rec #:  [id number]      Height:       68.0 in Accession #:    [id number]     Weight:       276.0 lb Date of Birth:  11-08-1955      BSA:          2.344 m Patient Age:    67 years       BP:           114/56 mmHg Patient Gender: M              HR:           79 bpm. Exam Location:  [facility] Procedure: Color Doppler and Cardiac Doppler Indications:     Elevated troponin  History:         Patient has no prior history of Echocardiogram examinations.                  Risk Factors:Hypertension and Sleep Apnea.  Sonographer:     Sherrie Sport RDCS (AE) Referring Phys:  [id number] Arvella Merles MANSY Diagnosing Phys: Kate Sable MD  Sonographer Comments: No apical window. IMPRESSIONS  1. Left ventricular ejection fraction, by estimation, is 60 to 65%. The left ventricle has normal function. The left ventricle has no regional wall motion abnormalities. There is mild left ventricular hypertrophy. Left ventricular diastolic function could not be evaluated.  2. Right ventricular systolic function is normal. The right ventricular size is normal.  3. The mitral valve is grossly normal. No evidence of mitral valve regurgitation.  4. The aortic valve is tricuspid. Aortic valve regurgitation is not visualized. Mild to moderate aortic valve sclerosis/calcification is present, without any evidence of aortic stenosis.  5. Aortic dilatation noted. There  is mild dilatation of the aortic root measuring 42 mm. FINDINGS  Left Ventricle: Left ventricular ejection fraction, by estimation, is 60 to 65%. The left ventricle has normal function. The left ventricle has no regional wall motion abnormalities. The left ventricular  internal cavity size was normal in size. There is  mild left ventricular hypertrophy. Left ventricular diastolic function could not be evaluated. Right Ventricle: The right ventricular size is normal. No increase in right ventricular wall thickness. Right ventricular systolic function is normal. Left Atrium: Left atrial size was normal in size. Right Atrium: Right atrial size was not well visualized. Pericardium: There is no evidence of pericardial effusion. Mitral Valve: The mitral valve is grossly normal. No evidence of mitral valve regurgitation. Tricuspid Valve: The tricuspid valve is grossly normal. Tricuspid valve regurgitation is not demonstrated. Aortic Valve: The aortic valve is tricuspid. Aortic valve regurgitation is not visualized. Mild to moderate aortic valve sclerosis/calcification is present, without any evidence of aortic stenosis. Pulmonic Valve: The pulmonic valve was not well visualized. Pulmonic valve regurgitation is trivial. Aorta: Aortic dilatation noted. There is mild dilatation of the aortic root measuring 42 mm. Venous: The inferior vena cava was not well visualized. IAS/Shunts: The interatrial septum was not well visualized.  LEFT VENTRICLE PLAX 2D LVIDd:         5.14 cm LVIDs:         2.65 cm LV PW:         1.39 cm LV IVS:        1.30 cm LVOT diam:     2.20 cm LVOT Area:     3.80 cm  LEFT ATRIUM         Index LA diam:    3.40 cm 1.45 cm/m                        PULMONIC VALVE AORTA                 PV Vmax:        0.89 m/s Ao Root diam: 4.10 cm PV Peak grad:   3.2 mmHg                       RVOT Peak grad: 14 mmHg  TRICUSPID VALVE TR Peak grad:   21.7 mmHg TR Vmax:        233.00 cm/s  SHUNTS Systemic Diam: 2.20 cm Kate Sable MD Electronically signed by Kate Sable MD Signature Date/Time: 08/24/2019/11:50:30 AM    Final     Scheduled Meds: . allopurinol  50 mg Oral QODAY  . vitamin C  500 mg Oral Daily  . Chlorhexidine Gluconate Cloth  6 each Topical Daily  . cholecalciferol  1,000 Units Oral Daily  . dexamethasone (DECADRON) injection  6 mg Intravenous Q24H  . diltiazem  240 mg Oral Daily  . epoetin (EPOGEN/PROCRIT) injection  4,000 Units Intravenous Q T,Th,Sa-HD  . furosemide  40 mg Intravenous BID  . influenza vac split quadrivalent PF  0.5 mL Intramuscular Tomorrow-1000  . insulin aspart  0-5 Units Subcutaneous QHS  . insulin aspart  0-9 Units Subcutaneous TID WC  . insulin aspart  3 Units Subcutaneous TID WC  . insulin detemir  0.075 Units/kg Subcutaneous BID  . linagliptin  5 mg Oral Daily  . midodrine  5 mg Oral Q T,Th,Sat-1800  . pyridOXINE  50 mg Oral Daily  . rosuvastatin  10 mg Oral Daily  . tamsulosin  0.4 mg Oral Daily  . zinc sulfate  220 mg Oral Daily   Continuous Infusions: . remdesivir 100 mg in NS 100 mL 100 mg (08/25/19 1243)     LOS: 1 day   Time spent: 40 minutes.  Lorella Nimrod, MD [facility]  If 7PM-7AM, please contact night-coverage [email or web address]  08/25/2019, 3:04 PM   This record has been created using Systems analyst. Errors have been sought and corrected,but may not always be located. Such creation errors do not reflect on the standard of care.

## 2019-08-25 NOTE — Progress Notes (Signed)
[facility], [place] 08/25/19  Subjective:   LOS: 1 03/24 0701 - 03/25 0700 In: 608.3 [Blood:608.3] Out: -   Patient known to our practice from recent admission  in ou patient dialysis Patient was sent to the ER for evaluation of heart racing during dialysis.  Patient tested positive for Covid recently. Patient had heart rate in the 120.  He had partial dialysis treatment Today, he feels well.  Able to eat a good breakfast.  Diarrhea has resolved Still feels tired and sleepy   HEMODIALYSIS FLOWSHEET:  Blood Flow Rate (mL/min): 400 mL/min Arterial Pressure (mmHg): -190 mmHg Venous Pressure (mmHg): 140 mmHg Transmembrane Pressure (mmHg): 60 mmHg Ultrafiltration Rate (mL/min): 330 mL/min Dialysate Flow Rate (mL/min): 600 ml/min Conductivity: Machine : 14 Conductivity: Machine : 14 Dialysis Fluid Bolus: Normal Saline Bolus Amount (mL): 250 mL     Objective:  Vital signs in last 24 hours:  Temp:  [97.5 F (36.4 C)-97.8 F (36.6 C)] 97.8 F (36.6 C) (03/25 1200) Pulse Rate:  [55-82] 76 (03/25 1234) Resp:  [19-28] 20 (03/25 1200) BP: (89-136)/(33-59) 120/52 (03/25 1234) SpO2:  [73 %-100 %] 99 % (03/25 1200) Weight:  [125.2 kg-125.6 kg] 125.6 kg (03/25 1200)  Weight change:  Filed Weights   08/23/19 1350 08/25/19 0915 08/25/19 1200  Weight: 125.2 kg 125.2 kg 125.6 kg    Intake/Output:    Intake/Output Summary (Last 24 hours) at 08/25/2019 1336 Last data filed at 08/25/2019 1200 Gross per 24 hour  Intake 382.33 ml  Output 0 ml  Net 382.33 ml     Physical Exam: General:  No acute distress, laying in the bed  HEENT  anicteric, moist oral mucous membrane  Pulm/lungs  supple, no masses, clear  CVS/Heart  no rub or gallop  Abdomen:   Soft, nontender  Extremities:  No peripheral edema  Neurologic:  Alert, oriented  Skin:  No acute rashes  Access:  PermCath       Basic Metabolic Panel:  Recent Labs  Lab 08/23/19 1350  08/23/19 1624 08/24/19 0613 08/25/19 0544  NA 136  --  137 135  K 3.4*  --  4.2 4.2  CL 102  --  103 103  CO2 22  --  20* 19*  GLUCOSE 140*  --  186* 204*  BUN 43*  --  59* 86*  CREATININE 5.33*  --  6.78* 7.40*  CALCIUM 7.7*  --  7.7* 7.8*  MG  --  2.1  --  2.1  PHOS  --  4.0  --  7.1*     CBC: Recent Labs  Lab 08/23/19 1350 08/23/19 1602 08/24/19 0613 08/25/19 0544  WBC 8.2 7.8 7.3 6.4  NEUTROABS  --  5.8  --  5.3  HGB 8.3* 8.2* 7.7* 7.2*  HCT 25.7* 26.0* 24.6* 22.4*  MCV 89.9 90.9 90.4 88.5  PLT 19* 20* 53* 64*      Lab Results  Component Value Date   HEPBSAG NON REACTIVE 08/08/2019   HEPBIGM NON REACTIVE 08/08/2019      Microbiology:  No results found for this or any previous visit (from the past 240 hour(s)).  Coagulation Studies: No results for input(s): LABPROT, INR in the last 72 hours.  Urinalysis: No results for input(s): COLORURINE, LABSPEC, PHURINE, GLUCOSEU, HGBUR, BILIRUBINUR, KETONESUR, PROTEINUR, UROBILINOGEN, NITRITE, LEUKOCYTESUR in the last 72 hours.  Invalid input(s): APPERANCEUR    Imaging: DG Chest 2 View  Result Date: 08/23/2019 CLINICAL DATA:  Shortness of breath EXAM:  CHEST - 2 VIEW COMPARISON:  08/09/2019 FINDINGS: Right tunneled dialysis catheter is unchanged. No pleural effusion or pneumothorax. Stable cardiomediastinal contours. No new consolidation or edema. No acute osseous abnormality. IMPRESSION: No acute process in the chest. Electronically Signed   By: Macy Mis M.D.   On: 08/23/2019 14:18   NM Pulmonary Perfusion  Result Date: 08/24/2019 CLINICAL DATA:  Hypoxia EXAM: NUCLEAR MEDICINE PERFUSION LUNG SCAN TECHNIQUE: Perfusion images were obtained in multiple projections after intravenous injection of radiopharmaceutical. Ventilation scans intentionally deferred if perfusion scan and chest x-ray adequate for interpretation during COVID 19 epidemic. Views: Anterior, posterior, left lateral, right lateral, RPO, LPO, RAO,  LAO RADIOPHARMACEUTICALS:  3.84 mCi Tc-69m MAA IV COMPARISON:  Chest radiograph August 23, 2019 FINDINGS: Radiotracer uptake is homogeneous and symmetric bilaterally. No evident perfusion defects. IMPRESSION: No perfusion defects.  Very low probability of pulmonary embolus. Electronically Signed   By: Lowella Grip III M.D.   On: 08/24/2019 13:56   ECHOCARDIOGRAM COMPLETE  Result Date: 08/24/2019    ECHOCARDIOGRAM REPORT   Patient Name:   Peter Pearson Date of Exam: 08/24/2019 Medical Rec #:  [id number]      Height:       68.0 in Accession #:    [id number]     Weight:       276.0 lb Date of Birth:  [birth year]      BSA:          2.344 m Patient Age:    25 years       BP:           114/56 mmHg Patient Gender: M              HR:           79 bpm. Exam Location:  [facility] Procedure: Color Doppler and Cardiac Doppler Indications:     Elevated troponin  History:         Patient has no prior history of Echocardiogram examinations.                  Risk Factors:Hypertension and Sleep Apnea.  Sonographer:     Sherrie Sport RDCS (AE) Referring Phys:  [id number] Arvella Merles MANSY Diagnosing Phys: Kate Sable MD  Sonographer Comments: No apical window. IMPRESSIONS  1. Left ventricular ejection fraction, by estimation, is 60 to 65%. The left ventricle has normal function. The left ventricle has no regional wall motion abnormalities. There is mild left ventricular hypertrophy. Left ventricular diastolic function could not be evaluated.  2. Right ventricular systolic function is normal. The right ventricular size is normal.  3. The mitral valve is grossly normal. No evidence of mitral valve regurgitation.  4. The aortic valve is tricuspid. Aortic valve regurgitation is not visualized. Mild to moderate aortic valve sclerosis/calcification is present, without any evidence of aortic stenosis.  5. Aortic dilatation noted. There is mild dilatation of the aortic root measuring 42 mm. FINDINGS  Left Ventricle: Left ventricular ejection  fraction, by estimation, is 60 to 65%. The left ventricle has normal function. The left ventricle has no regional wall motion abnormalities. The left ventricular internal cavity size was normal in size. There is  mild left ventricular hypertrophy. Left ventricular diastolic function could not be evaluated. Right Ventricle: The right ventricular size is normal. No increase in right ventricular wall thickness. Right ventricular systolic function is normal. Left Atrium: Left atrial size was normal in size. Right Atrium: Right atrial size was not well visualized. Pericardium: There is  no evidence of pericardial effusion. Mitral Valve: The mitral valve is grossly normal. No evidence of mitral valve regurgitation. Tricuspid Valve: The tricuspid valve is grossly normal. Tricuspid valve regurgitation is not demonstrated. Aortic Valve: The aortic valve is tricuspid. Aortic valve regurgitation is not visualized. Mild to moderate aortic valve sclerosis/calcification is present, without any evidence of aortic stenosis. Pulmonic Valve: The pulmonic valve was not well visualized. Pulmonic valve regurgitation is trivial. Aorta: Aortic dilatation noted. There is mild dilatation of the aortic root measuring 42 mm. Venous: The inferior vena cava was not well visualized. IAS/Shunts: The interatrial septum was not well visualized.  LEFT VENTRICLE PLAX 2D LVIDd:         5.14 cm LVIDs:         2.65 cm LV PW:         1.39 cm LV IVS:        1.30 cm LVOT diam:     2.20 cm LVOT Area:     3.80 cm  LEFT ATRIUM         Index LA diam:    3.40 cm 1.45 cm/m                        PULMONIC VALVE AORTA                 PV Vmax:        0.89 m/s Ao Root diam: 4.10 cm PV Peak grad:   3.2 mmHg                       RVOT Peak grad: 14 mmHg  TRICUSPID VALVE TR Peak grad:   21.7 mmHg TR Vmax:        233.00 cm/s  SHUNTS Systemic Diam: 2.20 cm Kate Sable MD Electronically signed by Kate Sable MD Signature Date/Time: 08/24/2019/11:50:30 AM     Final      Medications:   . remdesivir 100 mg in NS 100 mL 100 mg (08/25/19 1243)   . allopurinol  50 mg Oral QODAY  . vitamin C  500 mg Oral Daily  . Chlorhexidine Gluconate Cloth  6 each Topical Daily  . cholecalciferol  1,000 Units Oral Daily  . dexamethasone (DECADRON) injection  6 mg Intravenous Q24H  . diltiazem  240 mg Oral Daily  . epoetin (EPOGEN/PROCRIT) injection  4,000 Units Intravenous Q T,Th,Sa-HD  . furosemide  40 mg Intravenous BID  . influenza vac split quadrivalent PF  0.5 mL Intramuscular Tomorrow-1000  . insulin aspart  0-5 Units Subcutaneous QHS  . insulin aspart  0-9 Units Subcutaneous TID WC  . insulin aspart  3 Units Subcutaneous TID WC  . insulin detemir  0.075 Units/kg Subcutaneous BID  . linagliptin  5 mg Oral Daily  . midodrine  5 mg Oral Q T,Th,Sat-1800  . pyridOXINE  50 mg Oral Daily  . rosuvastatin  10 mg Oral Daily  . tamsulosin  0.4 mg Oral Daily  . zinc sulfate  220 mg Oral Daily   acetaminophen **OR** acetaminophen, chlorpheniramine-HYDROcodone, guaiFENesin-dextromethorphan, magnesium hydroxide, ondansetron **OR** ondansetron (ZOFRAN) IV, traZODone  Assessment/ Plan:  64 y.o. 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  First dialysis August 09, 2019 Patient seen during dialysis Tolerating well  #. Anemia of  CKD  Lab Results  Component Value Date   HGB 7.2 (L) 08/25/2019  Low dose EPO with HD  #. SHPTH     Component Value Date/Time   PTH 349 (H) 08/08/2019 1413   Lab Results  Component Value Date   PHOS 7.1 (H) 08/25/2019   Monitor calcium and phos level during this admission  #. Diabetes type 2 with CKD Hgb A1c MFr Bld (%)  Date Value  08/08/2019 6.3 (H)  Diet controlled  #COVID-19 infection Symptomatic cough No dyspnea or hypoxia IV remdesivir and Decadron as per internal medicine team  #Thrombocytopenia Baseline platelet level 229 on March 11 Admission platelet level of 19 which continues to improve.  He required platelet transfusion HIT antibodies results pending Avoid Heparin at present   LOS: Strong 3/25/20211:36 [place], [place]

## 2019-08-25 NOTE — Progress Notes (Signed)
Spoke with patient to inform about [facility] clinic and schedule. Patient stated that he will be able to drive self to treatments.

## 2019-08-26 ENCOUNTER — Inpatient Hospital Stay: Payer: PRIVATE HEALTH INSURANCE

## 2019-08-26 DIAGNOSIS — D7582 Heparin induced thrombocytopenia (HIT): Secondary | ICD-10-CM

## 2019-08-26 DIAGNOSIS — D75829 Heparin-induced thrombocytopenia, unspecified: Secondary | ICD-10-CM

## 2019-08-26 LAB — CBC WITH DIFFERENTIAL/PLATELET
Abs Immature Granulocytes: 0.13 10*3/uL — ABNORMAL HIGH (ref 0.00–0.07)
Basophils Absolute: 0 10*3/uL (ref 0.0–0.1)
Basophils Relative: 0 %
Eosinophils Absolute: 0 10*3/uL (ref 0.0–0.5)
Eosinophils Relative: 0 %
HCT: 26.1 % — ABNORMAL LOW (ref 39.0–52.0)
Hemoglobin: 8.7 g/dL — ABNORMAL LOW (ref 13.0–17.0)
Immature Granulocytes: 1 %
Lymphocytes Relative: 7 %
Lymphs Abs: 0.8 10*3/uL (ref 0.7–4.0)
MCH: 29 pg (ref 26.0–34.0)
MCHC: 33.3 g/dL (ref 30.0–36.0)
MCV: 87 fL (ref 80.0–100.0)
Monocytes Absolute: 0.2 10*3/uL (ref 0.1–1.0)
Monocytes Relative: 2 %
Neutro Abs: 9.7 10*3/uL — ABNORMAL HIGH (ref 1.7–7.7)
Neutrophils Relative %: 90 %
Platelets: 59 10*3/uL — ABNORMAL LOW (ref 150–400)
RBC: 3 MIL/uL — ABNORMAL LOW (ref 4.22–5.81)
RDW: 13.3 % (ref 11.5–15.5)
WBC: 10.8 10*3/uL — ABNORMAL HIGH (ref 4.0–10.5)
nRBC: 0 % (ref 0.0–0.2)

## 2019-08-26 LAB — COMPREHENSIVE METABOLIC PANEL
ALT: 30 U/L (ref 0–44)
AST: 32 U/L (ref 15–41)
Albumin: 2.6 g/dL — ABNORMAL LOW (ref 3.5–5.0)
Alkaline Phosphatase: 45 U/L (ref 38–126)
Anion gap: 14 (ref 5–15)
BUN: 77 mg/dL — ABNORMAL HIGH (ref 8–23)
CO2: 23 mmol/L (ref 22–32)
Calcium: 8 mg/dL — ABNORMAL LOW (ref 8.9–10.3)
Chloride: 102 mmol/L (ref 98–111)
Creatinine, Ser: 5.72 mg/dL — ABNORMAL HIGH (ref 0.61–1.24)
GFR calc Af Amer: 11 mL/min — ABNORMAL LOW (ref >60)
GFR calc non Af Amer: 10 mL/min — ABNORMAL LOW (ref >60)
Glucose, Bld: 181 mg/dL — ABNORMAL HIGH (ref 70–99)
Potassium: 4.1 mmol/L (ref 3.5–5.1)
Sodium: 139 mmol/L (ref 135–145)
Total Bilirubin: 0.5 mg/dL (ref 0.3–1.2)
Total Protein: 5.8 g/dL — ABNORMAL LOW (ref 6.5–8.1)

## 2019-08-26 LAB — SEROTONIN RELEASE ASSAY (SRA)
SRA .2 IU/mL UFH Ser-aCnc: 99 % — ABNORMAL HIGH (ref 0–20)
SRA 100IU/mL UFH Ser-aCnc: <1 % (ref 0–20)

## 2019-08-26 LAB — TYPE AND SCREEN
ABO/RH(D): A POS
Antibody Screen: NEGATIVE
Unit division: 0

## 2019-08-26 LAB — PROCALCITONIN: Procalcitonin: 1.33 ng/mL

## 2019-08-26 LAB — BPAM RBC
Blood Product Expiration Date: 202104032359
ISSUE DATE / TIME: 202103251022
Unit Type and Rh: 600

## 2019-08-26 LAB — GLUCOSE, CAPILLARY
Glucose-Capillary: 159 mg/dL — ABNORMAL HIGH (ref 70–99)
Glucose-Capillary: 256 mg/dL — ABNORMAL HIGH (ref 70–99)
Glucose-Capillary: 228 mg/dL — ABNORMAL HIGH (ref 70–99)

## 2019-08-26 LAB — PHOSPHORUS: Phosphorus: 5.2 mg/dL — ABNORMAL HIGH (ref 2.5–4.6)

## 2019-08-26 LAB — FIBRIN DERIVATIVES D-DIMER (ARMC ONLY): Fibrin derivatives D-dimer (ARMC): >7500 ng/mL (FEU) — ABNORMAL HIGH (ref 0.00–499.00)

## 2019-08-26 LAB — FERRITIN: Ferritin: 472 ng/mL — ABNORMAL HIGH (ref 24–336)

## 2019-08-26 LAB — MAGNESIUM: Magnesium: 2 mg/dL (ref 1.7–2.4)

## 2019-08-26 LAB — C-REACTIVE PROTEIN: CRP: 8.5 mg/dL — ABNORMAL HIGH (ref <1.0)

## 2019-08-26 MED ORDER — APIXABAN 5 MG PO TABS: 5.0000 mg | ORAL_TABLET | Freq: Two times a day (BID) | ORAL | Status: DC

## 2019-08-26 MED ORDER — ANTICOAGULANT SODIUM CITRATE 4% (200MG/5ML) IV SOLN
5.0000 mL | Status: DC
  Administered 2019-08-27: 5 mL
  Filled 2019-08-26 (×2): qty 5

## 2019-08-26 MED ORDER — APIXABAN 5 MG PO TABS
10.0000 mg | ORAL_TABLET | Freq: Two times a day (BID) | ORAL | Status: DC
  Administered 2019-08-26 – 2019-08-27 (×4): 10 mg via ORAL
  Filled 2019-08-26 (×5): qty 2

## 2019-08-26 MED ORDER — SODIUM CHLORIDE 0.9 % IV SOLN
2.0000 g | INTRAVENOUS | Status: DC
  Administered 2019-08-26: 18:00:00 2 g via INTRAVENOUS
  Filled 2019-08-26 (×2): qty 20

## 2019-08-26 MED ORDER — APIXABAN 2.5 MG PO TABS
2.5000 mg | ORAL_TABLET | Freq: Two times a day (BID) | ORAL | Status: DC
  Filled 2019-08-26: qty 1

## 2019-08-26 MED ORDER — AZITHROMYCIN 250 MG PO TABS
250.0000 mg | ORAL_TABLET | Freq: Every day | ORAL | Status: DC
  Administered 2019-08-27: 250 mg via ORAL
  Filled 2019-08-26 (×2): qty 1

## 2019-08-26 MED ORDER — AZITHROMYCIN 500 MG PO TABS
500.0000 mg | ORAL_TABLET | Freq: Every day | ORAL | Status: AC
  Administered 2019-08-26: 500 mg via ORAL
  Filled 2019-08-26: qty 1

## 2019-08-26 NOTE — Progress Notes (Signed)
PROGRESS NOTE    Peter Pearson  LOV:[id number] DOB: [birth year] DOA: 08/23/2019 PCP: System, Pcp Not In   Brief Narrative:  Peter Pearson  is a 64 y.o. 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  He had a short 1-1/2-hour out of 4-hour session secondarily.  His heart rate was up to 120.  He admitted to cough productive of whitish sputum.  He tested positive for COVID-19 on Friday.  He denies any chest pain or dyspnea but did had some diarrhea a day prior to admission.  No loss of taste or smell. Chest x-ray without any acute cardiopulmonary disease.  Labs significant for platelet of 19 which was 229 on 08/11/2019. No sign of any active bleeding and elevated troponin.  Markedly elevated inflammatory markers.  Subjective:  Denies any chest pain or shortness of breath.  No new complaints.  Assessment & Plan:   Active Problems:   ESRD (end stage renal disease) (HCC)   Tachycardia   Elevated troponin   COVID-19   Thrombocytopenia (HCC)   Mild sinus tachycardia with brief episode of hypoxia that resolved, with elevated troponin.  No chest pain or persistent dyspnea.  Troponin curve mostly flat.  Most likely secondary to demand.  Echocardiogram was with normal EF and no wall motion abnormalities.  Cardiology was consulted from ED and they are not considering any further work-up at this time. -Perfusion studies were negative for any perfusion defect.  COVID-19 infection.  Apparently patient was diagnosed on Friday 08/19/19 with COVID-19.  Having some cough but denies any fever, chills or dyspnea. Transient hypoxia which improves, now saturating well on room air. Markedly  elevated inflammatory markers.  He was started on remdesivir and Decadron.  D-dimer remained markedly elevated with some improvement in CRP. Procalcitonin elevated which was checked due to neutrophilic predominant leukocytosis today.  Patient is also on steroid but high risk for superadded bacterial infection. -Start him on ceftriaxone and azithromycin for CAP coverage. -Check UA  -Repeat chest x-ray with worsening of bilateral infiltrate. -Continue remdesivir-4/5 days. -Continue Decadron-4/10 days. -Continue supportive care with antitussives and vitamins supplement. -Continue to monitor inflammatory markers. -NM perfusion studies negative for perfusion defects.  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  Patient with severe thrombocytopenia with platelet of 19 on admission.  It was 229 on 08/11/2019 during his discharge with prior admission.  Most likely secondary to COVID-19 infection, can be contributory as HIT antibodies came back positive today. Hematology was consulted from ED, his fraction for immature platelets is inappropriately normal which shows bone marrow suppression most likely secondary to acute infection. He was given 2 units of platelets overnight with improvement to 59,>>64>>59 No enough active bleeding. -Start him on Eliquis for 4 weeks after discussing with hematology.  He will need close monitoring of his CBC which can be done at dialysis.  He will follow-up with hematology as an outpatient in 2 weeks. -Continue to monitor.  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  Recently started on dialysis.  Nephrology was consulted from ED. -Dialysis per nephrology.  Type 2 diabetes.  Well-controlled with A1c of 6.3 on/08/08/2019. CBG elevated as patient is on steroid now. -Start him on renally dosed insulin with Levemir,  and NovoLog.  Normocytic anemia.  Likely secondary to renal disease.  Hemoglobin down to 7.2 baseline around 9.  S/P 1 unit PRBC yesterday.  Hemoglobin 8.7 today. -Iron  studies consistent with anemia of chronic illness with decreased saturation, decreased reticulocyte count more consistent with bone marrow suppression. - EPO with dialysis. -Continue to monitor.  Dyslipidemia. -Continue statin.  History of gout.  No acute flare. -Continue home dose of allopurinol.  BPH.  -Continue home dose of Flomax.  Obstructive sleep apnea. -CPAP at bedtime.  Objective: Vitals:   08/25/19 1750 08/25/19 2342 08/26/19 0200 08/26/19 0836  BP: (!) 134/53 (!) 165/57  (!) 121/50  Pulse: 75 74 64 64  Resp: (!) 21 (!) 27 20 19   Temp:  98.3 F (36.8 C)  97.7 F (36.5 C)  TempSrc:  Oral  Oral  SpO2: 95% 97% 97% 95%  Weight:      Height:       No intake or output data in the 24 hours ending 08/26/19 1325 Filed Weights   08/23/19 1350 08/25/19 0915 08/25/19 1200  Weight: 125.2 kg 125.2 kg 125.6 kg    Examination:  General exam: Well-developed, obese gentleman, appears calm and comfortable  Respiratory system: Clear to auscultation. Respiratory effort normal. Cardiovascular system: S1 & S2 heard, RRR. No JVD, murmurs, rubs, gallops or clicks. Gastrointestinal system: Soft, obese, nontender, nondistended, bowel sounds positive. Central nervous system: Alert and oriented. No focal neurological deficits.Symmetric 5 x 5 power. Extremities: No edema, no cyanosis, pulses intact and symmetrical. Skin: No rashes, lesions or ulcers Psychiatry: Judgement and insight appear normal. Mood & affect appropriate.    DVT prophylaxis: Eliquis Code Status: Full Family Communication: Wife was updated on phone. Disposition Plan: Pending improvement and completion of remdesivir.  He will go back home.  Consultants:   Cardiology  Hematology  Nephrology  Procedures:  Antimicrobials:   Data Reviewed: I have personally reviewed following labs and imaging studies  CBC: Recent Labs  Lab 08/23/19 1350 08/23/19 1350 08/23/19 1602 08/24/19 0613 08/24/19 1358  08/25/19 0544 08/26/19 0419  WBC 8.2  --  7.8 7.3  --  6.4 10.8*  NEUTROABS  --   --  5.8  --   --  5.3 9.7*  HGB 8.3*  --  8.2* 7.7*  --  7.2* 8.7*  HCT 25.7*   < > 26.0* 24.6* 23.4* 22.4* 26.1*  MCV 89.9  --  90.9 90.4  --  88.5 87.0  PLT 19*  --  20* 53*  --  64* 59*   < > = values in this interval not displayed.   Basic Metabolic Panel: Recent Labs  Lab 08/23/19 1350 08/23/19 1624 08/24/19 0613 08/25/19 0544 08/26/19 0419  NA 136  --  137 135 139  K 3.4*  --  4.2 4.2 4.1  CL 102  --  103 103 102  CO2 22  --  20* 19* 23  GLUCOSE 140*  --  186* 204* 181*  BUN 43*  --  59* 86* 77*  CREATININE 5.33*  --  6.78* 7.40* 5.72*  CALCIUM 7.7*  --  7.7* 7.8* 8.0*  MG  --  2.1  --  2.1 2.0  PHOS  --  4.0  --  7.1* 5.2*   GFR: Estimated Creatinine Clearance: 17.3 mL/min (A) (by C-G formula based on SCr of 5.72 mg/dL (H)). Liver Function Tests: Recent Labs  Lab 08/23/19 1624 08/24/19 0613 08/25/19 0544 08/26/19 0419  AST 40 35 37 32  ALT 35 31 31 30   ALKPHOS 53 52 45 45  BILITOT 0.6 0.4 0.4 0.5  PROT 6.9 6.6 6.0* 5.8*  ALBUMIN 3.1* 2.9* 2.6* 2.6*   No results for input(s): LIPASE, AMYLASE in the last 168 hours. No results for input(s): AMMONIA in the last 168 hours. Coagulation Profile: No results for input(s): INR, PROTIME in the last 168 hours. Cardiac Enzymes: No results for input(s): CKTOTAL, CKMB, CKMBINDEX, TROPONINI in the last 168 hours. BNP (last 3 results) No results for input(s): PROBNP in the last 8760 hours. HbA1C: No results for input(s): HGBA1C in the last 72 hours. CBG: Recent Labs  Lab 08/25/19 0800 08/25/19 1152 08/25/19 1703 08/25/19 2036 08/26/19 0834  GLUCAP 184* 331* 362* 190* 159*   Lipid Profile: No results for input(s): CHOL, HDL, LDLCALC, TRIG, CHOLHDL, LDLDIRECT in the last 72 hours. Thyroid Function Tests: No results for input(s): TSH, T4TOTAL, FREET4, T3FREE, THYROIDAB in the last 72 hours. Anemia Panel: Recent Labs     08/23/19 1624 08/24/19 1358 08/25/19 0544 08/26/19 0419  VITAMINB12  --  1,171*  --   --   FERRITIN   < >  --  392* 472*  TIBC  --  228*  --   --   IRON  --  13*  --   --   RETICCTPCT  --  0.7  --   --    < > = values in this interval not displayed.   Sepsis Labs: Recent Labs  Lab 08/26/19 0419  PROCALCITON 1.33    No results found for this or any previous visit (from the past 240 hour(s)).   Radiology Studies: DG Chest 2 View  Result Date: 08/26/2019 CLINICAL DATA:  History of COVID-19 positivity with persistent cough EXAM: CHEST - 2 VIEW COMPARISON:  08/23/2019 FINDINGS: Cardiac shadow is stable. Dialysis catheter is again seen. Patchy increased opacities are noted bilaterally consistent with the given clinical history of COVID-19 positivity. No sizable effusion is noted. No bony abnormality is seen. IMPRESSION: Patchy opacities consistent with the given clinical history of COVID-19 positivity Electronically Signed   By: Inez Catalina M.D.   On: 08/26/2019 09:20   NM Pulmonary Perfusion  Result Date: 08/24/2019 CLINICAL DATA:  Hypoxia EXAM: NUCLEAR MEDICINE PERFUSION LUNG SCAN TECHNIQUE: Perfusion images were obtained in multiple projections after intravenous injection of radiopharmaceutical. Ventilation scans intentionally deferred if perfusion scan and chest x-ray adequate for interpretation during COVID 19 epidemic. Views: Anterior, posterior, left lateral, right lateral, RPO, LPO, RAO, LAO RADIOPHARMACEUTICALS:  3.84 mCi Tc-64m MAA IV COMPARISON:  Chest radiograph August 23, 2019 FINDINGS: Radiotracer uptake is homogeneous and symmetric bilaterally. No evident perfusion defects. IMPRESSION: No perfusion defects.  Very low probability of pulmonary embolus. Electronically Signed   By: Lowella Grip III M.D.   On: 08/24/2019 13:56    Scheduled Meds: . allopurinol  50 mg Oral QODAY  . vitamin C  500 mg Oral Daily  . azithromycin  500 mg Oral Daily   Followed by  . [START ON  08/27/2019] azithromycin  250 mg Oral Daily  . Chlorhexidine Gluconate Cloth  6 each Topical Daily  . cholecalciferol  1,000 Units Oral Daily  . dexamethasone (DECADRON) injection  6 mg Intravenous Q24H  . diltiazem  240 mg Oral Daily  . epoetin (EPOGEN/PROCRIT) injection  4,000 Units Intravenous Q T,Th,Sa-HD  . furosemide  40 mg Intravenous BID  . influenza vac split quadrivalent PF  0.5 mL Intramuscular Tomorrow-1000  . insulin aspart  0-5  Units Subcutaneous QHS  . insulin aspart  0-9 Units Subcutaneous TID WC  . insulin aspart  3 Units Subcutaneous TID WC  . insulin detemir  0.075 Units/kg Subcutaneous BID  . linagliptin  5 mg Oral Daily  . midodrine  5 mg Oral Q T,Th,Sat-1800  . pyridOXINE  50 mg Oral Daily  . rosuvastatin  10 mg Oral Daily  . tamsulosin  0.4 mg Oral Daily  . zinc sulfate  220 mg Oral Daily   Continuous Infusions: . cefTRIAXone (ROCEPHIN)  IV    . remdesivir 100 mg in NS 100 mL 100 mg (08/26/19 1028)     LOS: 2 days   Time spent: 40 minutes.  Lorella Nimrod, MD [facility]  If 7PM-7AM, please contact night-coverage [email or web address]  08/26/2019, 1:25 PM   This record has been created using Systems analyst. Errors have been sought and corrected,but may not always be located. Such creation errors do not reflect on the standard of care.

## 2019-08-26 NOTE — Progress Notes (Signed)
Hematology/Oncology Progress Note [facility] [phone] [phone]  Patient Care Team: System, Pcp Not In as PCP - General   Name of the patient: Peter Pearson  [id number]  28-Sep-1958  Date of visit: 08/26/19   INTERVAL HISTORY-  Patient reports feeling well.  Breathing has improved. Denies any easy bruising or bleeding events.  Denies any lower extremity swelling or pain.   Review of systems- Review of Systems  Constitutional: Negative for fatigue.  Respiratory: Negative for shortness of breath.   Cardiovascular: Negative for chest pain and leg swelling.  Gastrointestinal: Negative for abdominal pain.  Musculoskeletal: Negative for neck stiffness.  Skin: Negative for rash.  Neurological: Negative for headaches.  Hematological: Does not bruise/bleed easily.  Psychiatric/Behavioral: Negative for confusion.    Allergies  Allergen Reactions  . Amoxicillin-Pot Clavulanate Rash    Patient Active Problem List   Diagnosis Date Noted  . COVID-19 08/24/2019  . Elevated troponin   . Thrombocytopenia ([place])   . Tachycardia 08/23/2019  . ESRD (end stage renal disease) ([place]) 08/07/2019  . Essential hypertension 08/07/2019  . Anemia of chronic kidney failure, stage 5 ([place]) 08/07/2019     Past Medical History:  Diagnosis Date  . Chronic kidney disease   . Hyperlipidemia   . Hypertension   . Sleep apnea      Past Surgical History:  Procedure Laterality Date  . AV FISTULA PLACEMENT Left 08/10/2019   Procedure: Left Upper Extremity AV Fistula creation;  Surgeon: Algernon Huxley, MD;  Location: [facility];  Service: Vascular;  Laterality: Left;  . DIALYSIS/PERMA CATHETER INSERTION N/A 08/08/2019   Procedure: DIALYSIS/PERMA CATHETER INSERTION;  Surgeon: Algernon Huxley, MD;  Location: [facility];  Service: Cardiovascular;  Laterality: N/A;    Social History   Socioeconomic History  . Marital status: Married    Spouse name:  Shamim  . Number of children: 3  . Years of education: Not on file  . Highest education level: Not on file  Occupational History  . Not on file  Tobacco Use  . Smoking status: Former Smoker    Quit date: 08/06/1989    Years since quitting: 30.0  . Smokeless tobacco: Never Used  Substance and Sexual Activity  . Alcohol use: Never  . Drug use: Never  . Sexual activity: Not on file  Other Topics Concern  . Not on file  Social History Narrative  . Not on file   Social Determinants of Health   Financial Resource Strain:   . Difficulty of Paying Living Expenses:   Food Insecurity:   . Worried About Charity fundraiser in the Last Year:   . Arboriculturist in the Last Year:   Transportation Needs:   . Film/video editor (Medical):   Marland Kitchen Lack of Transportation (Non-Medical):   Physical Activity:   . Days of Exercise per Week:   . Minutes of Exercise per Session:   Stress:   . Feeling of Stress :   Social Connections:   . Frequency of Communication with Friends and Family:   . Frequency of Social Gatherings with Friends and Family:   . Attends Religious Services:   . Active Member of Clubs or Organizations:   . Attends Archivist Meetings:   Marland Kitchen Marital Status:   Intimate Partner Violence:   . Fear of Current or Ex-Partner:   . Emotionally Abused:   Marland Kitchen Physically Abused:   . Sexually Abused:  Family History  Problem Relation Age of Onset  . Heart disease Father      Current Facility-Administered Medications:  .  acetaminophen (TYLENOL) tablet 650 mg, 650 mg, Oral, Q6H PRN **OR** acetaminophen (TYLENOL) suppository 650 mg, 650 mg, Rectal, Q6H PRN, Mansy, Jan A, MD .  allopurinol (ZYLOPRIM) tablet 50 mg, 50 mg, Oral, QODAY, Mansy, Jan A, MD, 50 mg at 08/26/19 1011 .  apixaban (ELIQUIS) tablet 10 mg, 10 mg, Oral, BID **FOLLOWED BY** [START ON 09/02/2019] apixaban (ELIQUIS) tablet 5 mg, 5 mg, Oral, BID, Nazari, Walid A, RPH .  ascorbic acid (VITAMIN C) tablet  500 mg, 500 mg, Oral, Daily, Lorella Nimrod, MD, 500 mg at 08/26/19 1011 .  [COMPLETED] azithromycin (ZITHROMAX) tablet 500 mg, 500 mg, Oral, Daily, 500 mg at 08/26/19 1413 **FOLLOWED BY** [START ON 08/27/2019] azithromycin (ZITHROMAX) tablet 250 mg, 250 mg, Oral, Daily, Amin, Sumayya, MD .  cefTRIAXone (ROCEPHIN) 2 g in sodium chloride 0.9 % 100 mL IVPB, 2 g, Intravenous, Q24H, Lorella Nimrod, MD .  Chlorhexidine Gluconate Cloth 2 % PADS 6 each, 6 each, Topical, Daily, Lorella Nimrod, MD, 6 each at 08/26/19 1012 .  chlorpheniramine-HYDROcodone (TUSSIONEX) 10-8 MG/5ML suspension 5 mL, 5 mL, Oral, Q12H PRN, Lorella Nimrod, MD .  cholecalciferol (VITAMIN D3) tablet 1,000 Units, 1,000 Units, Oral, Daily, Mansy, Jan A, MD, 1,000 Units at 08/26/19 1011 .  dexamethasone (DECADRON) injection 6 mg, 6 mg, Intravenous, Q24H, Mansy, Jan A, MD, 6 mg at 08/25/19 2135 .  diltiazem (CARDIZEM CD) 24 hr capsule 240 mg, 240 mg, Oral, Daily, Mansy, Jan A, MD, 240 mg at 08/26/19 1012 .  epoetin alfa (EPOGEN) injection 4,000 Units, 4,000 Units, Intravenous, Q T,Th,Sa-HD, Murlean Iba, MD, 4,000 Units at 08/25/19 1245 .  furosemide (LASIX) injection 40 mg, 40 mg, Intravenous, BID, Mansy, Jan A, MD, 40 mg at 08/26/19 1011 .  guaiFENesin-dextromethorphan (ROBITUSSIN DM) 100-10 MG/5ML syrup 10 mL, 10 mL, Oral, Q4H PRN, Lorella Nimrod, MD .  influenza vac split quadrivalent PF (FLUARIX) injection 0.5 mL, 0.5 mL, Intramuscular, Tomorrow-1000, Amin, Sumayya, MD .  insulin aspart (novoLOG) injection 0-5 Units, 0-5 Units, Subcutaneous, QHS, Amin, Sumayya, MD .  insulin aspart (novoLOG) injection 0-9 Units, 0-9 Units, Subcutaneous, TID WC, Lorella Nimrod, MD, 3 Units at 08/26/19 1413 .  insulin aspart (novoLOG) injection 3 Units, 3 Units, Subcutaneous, TID WC, Lorella Nimrod, MD, 3 Units at 08/26/19 1414 .  insulin detemir (LEVEMIR) injection 9 Units, 0.075 Units/kg, Subcutaneous, BID, Lorella Nimrod, MD, 9 Units at 08/26/19 1013 .   linagliptin (TRADJENTA) tablet 5 mg, 5 mg, Oral, Daily, Lorella Nimrod, MD, 5 mg at 08/26/19 1011 .  magnesium hydroxide (MILK OF MAGNESIA) suspension 30 mL, 30 mL, Oral, Daily PRN, Mansy, Jan A, MD .  midodrine (PROAMATINE) tablet 5 mg, 5 mg, Oral, Q T,Th,Sat-1800, Mansy, Jan A, MD, 5 mg at 08/25/19 1756 .  ondansetron (ZOFRAN) tablet 4 mg, 4 mg, Oral, Q6H PRN **OR** ondansetron (ZOFRAN) injection 4 mg, 4 mg, Intravenous, Q6H PRN, Mansy, Jan A, MD .  pyridOXINE (VITAMIN B-6) tablet 50 mg, 50 mg, Oral, Daily, Mansy, Jan A, MD, 50 mg at 08/26/19 1012 .  [COMPLETED] remdesivir 200 mg in sodium chloride 0.9% 250 mL IVPB, 200 mg, Intravenous, Once, Last Rate: 580 mL/hr at 08/24/19 0109, 200 mg at 08/24/19 0109 **FOLLOWED BY** remdesivir 100 mg in sodium chloride 0.9 % 100 mL IVPB, 100 mg, Intravenous, Daily, Mansy, Arvella Merles, MD, Stopped at 08/26/19 1058 .  rosuvastatin (CRESTOR) tablet 10  mg, 10 mg, Oral, Daily, Mansy, Jan A, MD, 10 mg at 08/25/19 1756 .  tamsulosin (FLOMAX) capsule 0.4 mg, 0.4 mg, Oral, Daily, Mansy, Jan A, MD, 0.4 mg at 08/26/19 1011 .  traZODone (DESYREL) tablet 25 mg, 25 mg, Oral, QHS PRN, Mansy, Jan A, MD .  zinc sulfate capsule 220 mg, 220 mg, Oral, Daily, Lorella Nimrod, MD, 220 mg at 08/26/19 1011   Physical exam:  Vitals:   08/25/19 1750 08/25/19 2342 08/26/19 0200 08/26/19 0836  BP: (!) 134/53 (!) 165/57  (!) 121/50  Pulse: 75 74 64 64  Resp: (!) 21 (!) 27 20 19   Temp:  98.3 F (36.8 C)  97.7 F (36.5 C)  TempSrc:  Oral  Oral  SpO2: 95% 97% 97% 95%  Weight:      Height:       Physical Exam  Constitutional: He is oriented to person, place, and time. No distress.  HENT:  Head: Normocephalic and atraumatic.  Mouth/Throat: No oropharyngeal exudate.  Eyes: Pupils are equal, round, and reactive to light. EOM are normal. No scleral icterus.  Cardiovascular: Normal rate and regular rhythm.  No murmur heard. Pulmonary/Chest: Effort normal. No respiratory distress.   Abdominal: Soft. He exhibits no distension. There is no abdominal tenderness.  Musculoskeletal:        General: No edema. Normal range of motion.     Cervical back: Normal range of motion and neck supple.  Neurological: He is alert and oriented to person, place, and time.  Skin: Skin is warm and dry. No erythema.  Psychiatric: Affect normal.       CMP Latest Ref Rng & Units 08/26/2019  Glucose 70 - 99 mg/dL 181(H)  BUN 8 - 23 mg/dL 77(H)  Creatinine 0.61 - 1.24 mg/dL 5.72(H)  Sodium 135 - 145 mmol/L 139  Potassium 3.5 - 5.1 mmol/L 4.1  Chloride 98 - 111 mmol/L 102  CO2 22 - 32 mmol/L 23  Calcium 8.9 - 10.3 mg/dL 8.0(L)  Total Protein 6.5 - 8.1 g/dL 5.8(L)  Total Bilirubin 0.3 - 1.2 mg/dL 0.5  Alkaline Phos 38 - 126 U/L 45  AST 15 - 41 U/L 32  ALT 0 - 44 U/L 30   CBC Latest Ref Rng & Units 08/26/2019  WBC 4.0 - 10.5 K/uL 10.8(H)  Hemoglobin 13.0 - 17.0 g/dL 8.7(L)  Hematocrit 39.0 - 52.0 % 26.1(L)  Platelets 150 - 400 K/uL 59(L)    RADIOGRAPHIC STUDIES: I have personally reviewed the radiological images as listed and agreed with the findings in the report. DG Chest 2 View  Result Date: 08/26/2019 CLINICAL DATA:  History of COVID-19 positivity with persistent cough EXAM: CHEST - 2 VIEW COMPARISON:  08/23/2019 FINDINGS: Cardiac shadow is stable. Dialysis catheter is again seen. Patchy increased opacities are noted bilaterally consistent with the given clinical history of COVID-19 positivity. No sizable effusion is noted. No bony abnormality is seen. IMPRESSION: Patchy opacities consistent with the given clinical history of COVID-19 positivity Electronically Signed   By: Inez Catalina M.D.   On: 08/26/2019 09:20   DG Chest 2 View  Result Date: 08/23/2019 CLINICAL DATA:  Shortness of breath EXAM: CHEST - 2 VIEW COMPARISON:  08/09/2019 FINDINGS: Right tunneled dialysis catheter is unchanged. No pleural effusion or pneumothorax. Stable cardiomediastinal contours. No new consolidation  or edema. No acute osseous abnormality. IMPRESSION: No acute process in the chest. Electronically Signed   By: Macy Mis M.D.   On: 08/23/2019 14:18   DG Chest 2  View  Result Date: 08/09/2019 CLINICAL DATA:  Shortness of breath EXAM: CHEST - 2 VIEW COMPARISON:  08/07/2018 FINDINGS: Cardiac shadow is prominent but stable. New right jugular central line is noted in satisfactory position. No pneumothorax is seen. No focal infiltrate is seen. No bony abnormality is noted. IMPRESSION: No acute abnormality noted. Electronically Signed   By: Inez Catalina M.D.   On: 08/09/2019 15:55   NM Pulmonary Perfusion  Result Date: 08/24/2019 CLINICAL DATA:  Hypoxia EXAM: NUCLEAR MEDICINE PERFUSION LUNG SCAN TECHNIQUE: Perfusion images were obtained in multiple projections after intravenous injection of radiopharmaceutical. Ventilation scans intentionally deferred if perfusion scan and chest x-ray adequate for interpretation during COVID 19 epidemic. Views: Anterior, posterior, left lateral, right lateral, RPO, LPO, RAO, LAO RADIOPHARMACEUTICALS:  3.84 mCi Tc-76m MAA IV COMPARISON:  Chest radiograph August 23, 2019 FINDINGS: Radiotracer uptake is homogeneous and symmetric bilaterally. No evident perfusion defects. IMPRESSION: No perfusion defects.  Very low probability of pulmonary embolus. Electronically Signed   By: Lowella Grip III M.D.   On: 08/24/2019 13:56   PERIPHERAL VASCULAR CATHETERIZATION  Result Date: 08/08/2019 See op note  DG Chest Port 1 View  Result Date: 08/07/2019 CLINICAL DATA:  Shortness of breath for a month, denies chest pain and cough EXAM: PORTABLE CHEST 1 VIEW COMPARISON:  Radiograph 06/08/2018, 10/12/2000 FINDINGS: Slightly bulbous configuration of the right main pulmonary artery, could reflect some right hilar adenopathy or pulmonary arterial congestion. No consolidative opacity is seen. Lung volumes are low with hazy interstitial changes and cephalized vascular markings. Cardiac  silhouette is upper limits normal. No acute osseous or soft tissue abnormality. Telemetry leads overlie the chest. IMPRESSION: Features of central vascular congestion. Convex appearance of the right main pulmonary artery/hilum, nonspecific and possibly artifactual but could reflect hilar adenopathy, consider CT imaging. Electronically Signed   By: Lovena Le M.D.   On: 08/07/2019 15:16   ECHOCARDIOGRAM COMPLETE  Result Date: 08/24/2019    ECHOCARDIOGRAM REPORT   Patient Name:   KACEN MELLINGER Date of Exam: 08/24/2019 Medical Rec #:  [id number]      Height:       68.0 in Accession #:    [id number]     Weight:       276.0 lb Date of Birth:  August 03, 1955      BSA:          2.344 m Patient Age:    64 years       BP:           114/56 mmHg Patient Gender: M              HR:           79 bpm. Exam Location:  [facility] Procedure: Color Doppler and Cardiac Doppler Indications:     Elevated troponin  History:         Patient has no prior history of Echocardiogram examinations.                  Risk Factors:Hypertension and Sleep Apnea.  Sonographer:     Sherrie Sport RDCS (AE) Referring Phys:  [id number] Arvella Merles MANSY Diagnosing Phys: Kate Sable MD  Sonographer Comments: No apical window. IMPRESSIONS  1. Left ventricular ejection fraction, by estimation, is 60 to 65%. The left ventricle has normal function. The left ventricle has no regional wall motion abnormalities. There is mild left ventricular hypertrophy. Left ventricular diastolic function could not be evaluated.  2. Right ventricular systolic function is normal. The right  ventricular size is normal.  3. The mitral valve is grossly normal. No evidence of mitral valve regurgitation.  4. The aortic valve is tricuspid. Aortic valve regurgitation is not visualized. Mild to moderate aortic valve sclerosis/calcification is present, without any evidence of aortic stenosis.  5. Aortic dilatation noted. There is mild dilatation of the aortic root measuring 42 mm. FINDINGS  Left  Ventricle: Left ventricular ejection fraction, by estimation, is 60 to 65%. The left ventricle has normal function. The left ventricle has no regional wall motion abnormalities. The left ventricular internal cavity size was normal in size. There is  mild left ventricular hypertrophy. Left ventricular diastolic function could not be evaluated. Right Ventricle: The right ventricular size is normal. No increase in right ventricular wall thickness. Right ventricular systolic function is normal. Left Atrium: Left atrial size was normal in size. Right Atrium: Right atrial size was not well visualized. Pericardium: There is no evidence of pericardial effusion. Mitral Valve: The mitral valve is grossly normal. No evidence of mitral valve regurgitation. Tricuspid Valve: The tricuspid valve is grossly normal. Tricuspid valve regurgitation is not demonstrated. Aortic Valve: The aortic valve is tricuspid. Aortic valve regurgitation is not visualized. Mild to moderate aortic valve sclerosis/calcification is present, without any evidence of aortic stenosis. Pulmonic Valve: The pulmonic valve was not well visualized. Pulmonic valve regurgitation is trivial. Aorta: Aortic dilatation noted. There is mild dilatation of the aortic root measuring 42 mm. Venous: The inferior vena cava was not well visualized. IAS/Shunts: The interatrial septum was not well visualized.  LEFT VENTRICLE PLAX 2D LVIDd:         5.14 cm LVIDs:         2.65 cm LV PW:         1.39 cm LV IVS:        1.30 cm LVOT diam:     2.20 cm LVOT Area:     3.80 cm  LEFT ATRIUM         Index LA diam:    3.40 cm 1.45 cm/m                        PULMONIC VALVE AORTA                 PV Vmax:        0.89 m/s Ao Root diam: 4.10 cm PV Peak grad:   3.2 mmHg                       RVOT Peak grad: 14 mmHg  TRICUSPID VALVE TR Peak grad:   21.7 mmHg TR Vmax:        233.00 cm/s  SHUNTS Systemic Diam: 2.20 cm Kate Sable MD Electronically signed by Kate Sable MD Signature  Date/Time: 08/24/2019/11:50:30 AM    Final     Assessment and plan-   #Thrombocytopenia Reticulocyte hemoglobin inappropriately normal.  Consider bone marrow suppression due to acute COVID-19 infection. 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 Recommend patient to start nonheparin anticoagulant-i.e. argatroban, other options ie, Eliquis is also reasonable, given that his thrombocytopenia has already improved, no thrombosis and anticipate discharge tomorrow.  Eliquis 10 mg twice daily for 7 days, or until platelet counts is above 150,000, whichever is longer.  Recommend monitor platelet count closely. followed by 5 mg twice daily.  Recommend  about 4 weeks of anticoagulation, Patient likely will be discharged soon and I recommend patient to have blood drawn in 3 days and 1 week after discharge during hemodialysis sessions and have results faxed to me.  Due to his COVID-19 infection, patient would not be able to get blood work done at [facility] and be seen in person.  I will see patient and further manage his anticoagulation after he passed the quarantine period.  I discussed with patient about potential side effects of anticoagulation.  Patient voices understanding and agrees with the treatment. Plan was discussed with Dr. Reesa Chew.  Thank you for allowing me to participate in the care of this patient.   Earlie Server, MD, PhD Hematology Oncology [facility] Pager- [phone] 08/26/2019

## 2019-08-26 NOTE — Progress Notes (Signed)
[facility], [place] 08/26/19  Subjective:   LOS: 2 03/25 0701 - 03/26 0700 In: 382.3 [Blood:382.3] Out: 0   Patient known to our practice from recent admission  in outpatient dialysis Patient was sent to the ER for evaluation of heart racing during dialysis.  Patient tested positive for Covid recently. Patient had heart rate in the 120.  He had partial dialysis treatment   Today, he feels well.  Able to eat a good breakfast.  Diarrhea has resolved Tolerated dialysis well yesterday Currently on room air  Objective:  Vital signs in last 24 hours:  Temp:  [97.7 F (36.5 C)-98.3 F (36.8 C)] 97.7 F (36.5 C) (03/26 0836) Pulse Rate:  [64-82] 64 (03/26 0836) Resp:  [12-30] 19 (03/26 0836) BP: (96-165)/(40-66) 121/50 (03/26 0836) SpO2:  [95 %-100 %] 95 % (03/26 0836) Weight:  [125.6 kg] 125.6 kg (03/25 1200)  Weight change:  Filed Weights   08/23/19 1350 08/25/19 0915 08/25/19 1200  Weight: 125.2 kg 125.2 kg 125.6 kg    Intake/Output:    Intake/Output Summary (Last 24 hours) at 08/26/2019 1135 Last data filed at 08/25/2019 1200 Gross per 24 hour  Intake -  Output 0 ml  Net 0 ml     Physical Exam: General:  No acute distress, laying in the bed  HEENT  anicteric, moist oral mucous membrane  Pulm/lungs  supple, no masses, clear, room air  CVS/Heart  no rub or gallop, regular  Abdomen:   Soft, nontender  Extremities:  No peripheral edema  Neurologic:  Alert, oriented  Skin:  No acute rashes  Access:  PermCath       Basic Metabolic Panel:  Recent Labs  Lab 08/23/19 1350 08/23/19 1350 08/23/19 1624 08/24/19 0613 08/25/19 0544 08/26/19 0419  NA 136  --   --  137 135 139  K 3.4*  --   --  4.2 4.2 4.1  CL 102  --   --  103 103 102  CO2 22  --   --  20* 19* 23  GLUCOSE 140*  --   --  186* 204* 181*  BUN 43*  --   --  59* 86* 77*  CREATININE 5.33*  --   --  6.78* 7.40* 5.72*  CALCIUM 7.7*   < >  --  7.7* 7.8* 8.0*  MG  --   --   2.1  --  2.1 2.0  PHOS  --   --  4.0  --  7.1* 5.2*   < > = values in this interval not displayed.     CBC: Recent Labs  Lab 08/23/19 1350 08/23/19 1350 08/23/19 1602 08/24/19 0613 08/24/19 1358 08/25/19 0544 08/26/19 0419  WBC 8.2  --  7.8 7.3  --  6.4 10.8*  NEUTROABS  --   --  5.8  --   --  5.3 9.7*  HGB 8.3*  --  8.2* 7.7*  --  7.2* 8.7*  HCT 25.7*   < > 26.0* 24.6* 23.4* 22.4* 26.1*  MCV 89.9  --  90.9 90.4  --  88.5 87.0  PLT 19*  --  20* 53*  --  64* 59*   < > = values in this interval not displayed.      Lab Results  Component Value Date   HEPBSAG NON REACTIVE 08/08/2019   HEPBIGM NON REACTIVE 08/08/2019      Microbiology:  No results found for this or any previous visit (from the past  240 hour(s)).  Coagulation Studies: No results for input(s): LABPROT, INR in the last 72 hours.  Urinalysis: No results for input(s): COLORURINE, LABSPEC, PHURINE, GLUCOSEU, HGBUR, BILIRUBINUR, KETONESUR, PROTEINUR, UROBILINOGEN, NITRITE, LEUKOCYTESUR in the last 72 hours.  Invalid input(s): APPERANCEUR    Imaging: DG Chest 2 View  Result Date: 08/26/2019 CLINICAL DATA:  History of COVID-19 positivity with persistent cough EXAM: CHEST - 2 VIEW COMPARISON:  08/23/2019 FINDINGS: Cardiac shadow is stable. Dialysis catheter is again seen. Patchy increased opacities are noted bilaterally consistent with the given clinical history of COVID-19 positivity. No sizable effusion is noted. No bony abnormality is seen. IMPRESSION: Patchy opacities consistent with the given clinical history of COVID-19 positivity Electronically Signed   By: Inez Catalina M.D.   On: 08/26/2019 09:20   NM Pulmonary Perfusion  Result Date: 08/24/2019 CLINICAL DATA:  Hypoxia EXAM: NUCLEAR MEDICINE PERFUSION LUNG SCAN TECHNIQUE: Perfusion images were obtained in multiple projections after intravenous injection of radiopharmaceutical. Ventilation scans intentionally deferred if perfusion scan and chest x-ray  adequate for interpretation during COVID 19 epidemic. Views: Anterior, posterior, left lateral, right lateral, RPO, LPO, RAO, LAO RADIOPHARMACEUTICALS:  3.84 mCi Tc-12m MAA IV COMPARISON:  Chest radiograph August 23, 2019 FINDINGS: Radiotracer uptake is homogeneous and symmetric bilaterally. No evident perfusion defects. IMPRESSION: No perfusion defects.  Very low probability of pulmonary embolus. Electronically Signed   By: Lowella Grip III M.D.   On: 08/24/2019 13:56     Medications:   . remdesivir 100 mg in NS 100 mL 100 mg (08/26/19 1028)   . allopurinol  50 mg Oral QODAY  . vitamin C  500 mg Oral Daily  . Chlorhexidine Gluconate Cloth  6 each Topical Daily  . cholecalciferol  1,000 Units Oral Daily  . dexamethasone (DECADRON) injection  6 mg Intravenous Q24H  . diltiazem  240 mg Oral Daily  . epoetin (EPOGEN/PROCRIT) injection  4,000 Units Intravenous Q T,Th,Sa-HD  . furosemide  40 mg Intravenous BID  . influenza vac split quadrivalent PF  0.5 mL Intramuscular Tomorrow-1000  . insulin aspart  0-5 Units Subcutaneous QHS  . insulin aspart  0-9 Units Subcutaneous TID WC  . insulin aspart  3 Units Subcutaneous TID WC  . insulin detemir  0.075 Units/kg Subcutaneous BID  . linagliptin  5 mg Oral Daily  . midodrine  5 mg Oral Q T,Th,Sat-1800  . pyridOXINE  50 mg Oral Daily  . rosuvastatin  10 mg Oral Daily  . tamsulosin  0.4 mg Oral Daily  . zinc sulfate  220 mg Oral Daily   acetaminophen **OR** acetaminophen, chlorpheniramine-HYDROcodone, guaiFENesin-dextromethorphan, magnesium hydroxide, ondansetron **OR** ondansetron (ZOFRAN) IV, traZODone  Assessment/ Plan:  64 y.o. 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  First dialysis August 09, 2019 Next hemodialysis planned for Saturday  #. Anemia of CKD  Lab Results  Component Value Date   HGB 8.7 (L) 08/26/2019  Low dose EPO with HD  #. SHPTH  Component Value Date/Time   PTH 349 (H) 08/08/2019 1413   Lab Results  Component Value Date   PHOS 5.2 (H) 08/26/2019   Monitor calcium and phos level during this admission  #. Diabetes type 2 with CKD Hgb A1c MFr Bld (%)  Date Value  08/08/2019 6.3 (H)  Diet controlled  #COVID-19 infection Symptomatic cough No dyspnea or hypoxia IV remdesivir and Decadron as per internal medicine team  #Thrombocytopenia Baseline platelet level 229 on March 11 Admission platelet level of 19 which continues to improve.  He required platelet transfusion HIT antibodies and serotonin release assay both are positive Avoid heparin during dialysis Started on Eliquis for anticoagulation   LOS: 2 Kritika [facility] Candiss Norse 3/26/202111:35 AM  [place], [place]

## 2019-08-26 NOTE — Consult Note (Addendum)
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 Amoxicillin-Pot Clavulanate Rash    Patient Measurements: Height: 5\' 8"  (172.7 cm) Weight: 276 lb 14.4 oz (125.6 kg) IBW/kg (Calculated) : 68.4 Heparin Dosing Weight: N/A  Vital Signs: Temp: 97.7 F (36.5 C) (03/26 0836) Temp Source: Oral (03/26 0836) BP: 121/50 (03/26 0836) Pulse Rate: 64 (03/26 0836)  Labs: Recent Labs    08/23/19 1552 08/23/19 1602 08/24/19 9476 08/24/19 5465 08/24/19 1358 08/25/19 0544 08/26/19 0419  HGB  --    < > 7.7*   < >  --  7.2* 8.7*  HCT  --    < > 24.6*  --  23.4* 22.4* 26.1*  PLT  --    < > 53*  --   --  64* 59*  CREATININE  --    < > 6.78*  --   --  7.40* 5.72*  TROPONINIHS 186*  --  271*  --   --  111*  --    < > = values in this interval not displayed.    Estimated Creatinine Clearance: 17.3 mL/min (A) (by C-G formula based on SCr of 5.72 mg/dL (H)).   Medical History: Past Medical History:  Diagnosis Date  . Chronic kidney disease   . Hyperlipidemia   . Hypertension   . Sleep apnea     Medications:  Medications Prior to Admission  Medication Sig Dispense Refill Last Dose  . allopurinol (ZYLOPRIM) 100 MG tablet Take 100 mg by mouth daily.   unknown at unknown  . diltiazem (CARDIZEM CD) 240 MG 24 hr capsule Take 240 mg by mouth daily.   unknown at unknown  . midodrine (PROAMATINE) 5 MG tablet Take 5 mg of midodrine on dialysis days (Tuesday, Thursday, and Saturday) 20 tablet 0 unknown at unknown  . pyridOXINE (VITAMIN B-6) 50 MG tablet Take 50-100 mg by mouth daily.    unknown at unknown  . rosuvastatin (CRESTOR) 20 MG tablet Take 10 mg by mouth daily.   unknown at unknown  . tamsulosin (FLOMAX) 0.4 MG CAPS capsule Take 0.4 mg by mouth daily.   unknown at unknown   Scheduled:  . allopurinol  50 mg Oral QODAY  . apixaban  2.5 mg Oral BID  . vitamin C  500 mg Oral Daily  . azithromycin   500 mg Oral Daily   Followed by  . [START ON 08/27/2019] azithromycin  250 mg Oral Daily  . Chlorhexidine Gluconate Cloth  6 each Topical Daily  . cholecalciferol  1,000 Units Oral Daily  . dexamethasone (DECADRON) injection  6 mg Intravenous Q24H  . diltiazem  240 mg Oral Daily  . epoetin (EPOGEN/PROCRIT) injection  4,000 Units Intravenous Q T,Th,Sa-HD  . furosemide  40 mg Intravenous BID  . influenza vac split quadrivalent PF  0.5 mL Intramuscular Tomorrow-1000  . insulin aspart  0-5 Units Subcutaneous QHS  . insulin aspart  0-9 Units Subcutaneous TID WC  . insulin aspart  3 Units Subcutaneous TID WC  . insulin detemir  0.075 Units/kg Subcutaneous BID  . linagliptin  5 mg Oral Daily  . midodrine  5 mg Oral Q T,Th,Sat-1800  . pyridOXINE  50 mg Oral Daily  . rosuvastatin  10 mg Oral Daily  . tamsulosin  0.4 mg Oral Daily  . zinc sulfate  220 mg Oral Daily   Infusions:  . cefTRIAXone (ROCEPHIN)  IV    . remdesivir 100  mg in NS 100 mL 100 mg (08/26/19 1028)   PRN: acetaminophen **OR** acetaminophen, chlorpheniramine-HYDROcodone, guaiFENesin-dextromethorphan, magnesium hydroxide, ondansetron **OR** ondansetron (ZOFRAN) IV, traZODone Anti-infectives (From admission, onward)   Start     Dose/Rate Route Frequency Ordered Stop   08/27/19 1000  azithromycin (ZITHROMAX) tablet 250 mg     250 mg Oral Daily 08/26/19 1201 08/31/19 0959   08/26/19 1600  cefTRIAXone (ROCEPHIN) 2 g in sodium chloride 0.9 % 100 mL IVPB     2 g 200 mL/hr over 30 Minutes Intravenous Every 24 hours 08/26/19 1315     08/26/19 1230  azithromycin (ZITHROMAX) tablet 500 mg     500 mg Oral Daily 08/26/19 1201 08/27/19 0959   08/24/19 1000  remdesivir 100 mg in sodium chloride 0.9 % 100 mL IVPB     100 mg 200 mL/hr over 30 Minutes Intravenous Daily 08/23/19 2106 08/28/19 0959   08/23/19 2115  remdesivir 200 mg in sodium chloride 0.9% 250 mL IVPB     200 mg 580 mL/hr over 30 Minutes Intravenous Once 08/23/19 2106  08/24/19 0139      Assessment: Pharmacy has been consulted to initiate Apixaban therapy on 64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 The Serotonin Release Assay is still pending.  Goal of Therapy:  Monitor platelets by anticoagulation protocol: Yes   Plan:  After discussion with Hematology, will place patient on Apixaban 10mg  BID x 7 days, followed by 5mg  PO BID x 21 days for a total of 4 weeks. Patient will have platelets and hemoglobin monitored during each hemodialysis treatment.  Will continue to monitor H&H as well as platelets while in-patient with daily CBC.  Kandance Yano A Jahnyla Parrillo 08/26/2019,1:49 PM

## 2019-08-27 DIAGNOSIS — R059 Cough, unspecified: Secondary | ICD-10-CM

## 2019-08-27 DIAGNOSIS — R05 Cough: Secondary | ICD-10-CM

## 2019-08-27 LAB — GLUCOSE, CAPILLARY
Glucose-Capillary: 208 mg/dL — ABNORMAL HIGH (ref 70–99)
Glucose-Capillary: 242 mg/dL — ABNORMAL HIGH (ref 70–99)
Glucose-Capillary: 218 mg/dL — ABNORMAL HIGH (ref 70–99)
Glucose-Capillary: 206 mg/dL — ABNORMAL HIGH (ref 70–99)

## 2019-08-27 LAB — COMPREHENSIVE METABOLIC PANEL
ALT: 29 U/L (ref 0–44)
AST: 22 U/L (ref 15–41)
Albumin: 2.8 g/dL — ABNORMAL LOW (ref 3.5–5.0)
Alkaline Phosphatase: 58 U/L (ref 38–126)
Anion gap: 18 — ABNORMAL HIGH (ref 5–15)
BUN: 100 mg/dL — ABNORMAL HIGH (ref 8–23)
CO2: 19 mmol/L — ABNORMAL LOW (ref 22–32)
Calcium: 7.9 mg/dL — ABNORMAL LOW (ref 8.9–10.3)
Chloride: 98 mmol/L (ref 98–111)
Creatinine, Ser: 6.54 mg/dL — ABNORMAL HIGH (ref 0.61–1.24)
GFR calc Af Amer: 10 mL/min — ABNORMAL LOW (ref >60)
GFR calc non Af Amer: 8 mL/min — ABNORMAL LOW (ref >60)
Glucose, Bld: 177 mg/dL — ABNORMAL HIGH (ref 70–99)
Potassium: 4.1 mmol/L (ref 3.5–5.1)
Sodium: 135 mmol/L (ref 135–145)
Total Bilirubin: 0.4 mg/dL (ref 0.3–1.2)
Total Protein: 6.2 g/dL — ABNORMAL LOW (ref 6.5–8.1)

## 2019-08-27 LAB — C-REACTIVE PROTEIN: CRP: 5.8 mg/dL — ABNORMAL HIGH (ref <1.0)

## 2019-08-27 LAB — CBC WITH DIFFERENTIAL/PLATELET
Abs Immature Granulocytes: 0.18 10*3/uL — ABNORMAL HIGH (ref 0.00–0.07)
Basophils Absolute: 0 10*3/uL (ref 0.0–0.1)
Basophils Relative: 0 %
Eosinophils Absolute: 0 10*3/uL (ref 0.0–0.5)
Eosinophils Relative: 0 %
HCT: 28.6 % — ABNORMAL LOW (ref 39.0–52.0)
Hemoglobin: 9.3 g/dL — ABNORMAL LOW (ref 13.0–17.0)
Immature Granulocytes: 2 %
Lymphocytes Relative: 9 %
Lymphs Abs: 1 10*3/uL (ref 0.7–4.0)
MCH: 28.3 pg (ref 26.0–34.0)
MCHC: 32.5 g/dL (ref 30.0–36.0)
MCV: 86.9 fL (ref 80.0–100.0)
Monocytes Absolute: 0.2 10*3/uL (ref 0.1–1.0)
Monocytes Relative: 2 %
Neutro Abs: 9.2 10*3/uL — ABNORMAL HIGH (ref 1.7–7.7)
Neutrophils Relative %: 87 %
Platelets: 68 10*3/uL — ABNORMAL LOW (ref 150–400)
RBC: 3.29 MIL/uL — ABNORMAL LOW (ref 4.22–5.81)
RDW: 13.5 % (ref 11.5–15.5)
WBC: 10.6 10*3/uL — ABNORMAL HIGH (ref 4.0–10.5)
nRBC: 0 % (ref 0.0–0.2)

## 2019-08-27 LAB — PHOSPHORUS: Phosphorus: 6.8 mg/dL — ABNORMAL HIGH (ref 2.5–4.6)

## 2019-08-27 LAB — FIBRIN DERIVATIVES D-DIMER (ARMC ONLY): Fibrin derivatives D-dimer (ARMC): >7500 ng/mL (FEU) — ABNORMAL HIGH (ref 0.00–499.00)

## 2019-08-27 LAB — MAGNESIUM: Magnesium: 2.1 mg/dL (ref 1.7–2.4)

## 2019-08-27 LAB — PROCALCITONIN: Procalcitonin: 0.62 ng/mL

## 2019-08-27 LAB — FERRITIN: Ferritin: 563 ng/mL — ABNORMAL HIGH (ref 24–336)

## 2019-08-27 MED ORDER — ASCORBIC ACID 500 MG PO TABS: 500.0000 mg | ORAL_TABLET | Freq: Every day | ORAL | 0 refills | Status: DC

## 2019-08-27 MED ORDER — CEFDINIR 300 MG PO CAPS: 300.0000 mg | ORAL_CAPSULE | Freq: Two times a day (BID) | ORAL | 0 refills | Status: AC

## 2019-08-27 MED ORDER — AZITHROMYCIN 250 MG PO TABS: ORAL_TABLET | ORAL | 0 refills | Status: DC

## 2019-08-27 MED ORDER — HYDROCOD POLST-CPM POLST ER 10-8 MG/5ML PO SUER: 5.0000 mL | Freq: Two times a day (BID) | ORAL | 0 refills | Status: DC | PRN

## 2019-08-27 MED ORDER — ZINC SULFATE 220 (50 ZN) MG PO CAPS: 220.0000 mg | ORAL_CAPSULE | Freq: Every day | ORAL | 0 refills | Status: DC

## 2019-08-27 MED ORDER — VITAMIN D3 25 MCG PO TABS: 1000.0000 [IU] | ORAL_TABLET | Freq: Every day | ORAL | 0 refills | Status: DC

## 2019-08-27 MED ORDER — INSULIN DETEMIR 100 UNIT/ML ~~LOC~~ SOLN: 0.0750 [IU]/kg | Freq: Two times a day (BID) | SUBCUTANEOUS | 11 refills | Status: DC

## 2019-08-27 MED ORDER — LINAGLIPTIN 5 MG PO TABS: 5.0000 mg | ORAL_TABLET | Freq: Every day | ORAL | 1 refills | Status: DC

## 2019-08-27 MED ORDER — APIXABAN 5 MG PO TABS: 10.0000 mg | ORAL_TABLET | Freq: Two times a day (BID) | ORAL | 0 refills | Status: DC

## 2019-08-27 MED ORDER — DEXAMETHASONE 6 MG PO TABS: 6.0000 mg | ORAL_TABLET | Freq: Every day | ORAL | 0 refills | Status: AC

## 2019-08-27 NOTE — Discharge Instructions (Signed)
10 Things You Can Do to Manage Your COVID-19 Symptoms at Home If you have possible or confirmed COVID-19: 1. Stay home from work and school. And stay away from other public places. If you must go out, avoid using any kind of public transportation, ridesharing, or taxis. 2. Monitor your symptoms carefully. If your symptoms get worse, call your healthcare provider immediately. 3. Get rest and stay hydrated. 4. If you have a medical appointment, call the healthcare provider ahead of time and tell them that you have or may have COVID-19. 5. For medical emergencies, call 911 and notify the dispatch personnel that you have or may have COVID-19. 6. Cover your cough and sneezes with a tissue or use the inside of your elbow. 7. Wash your hands often with soap and water for at least 20 seconds or clean your hands with an alcohol-based hand sanitizer that contains at least 60% alcohol. 8. As much as possible, stay in a specific room and away from other people in your home. Also, you should use a separate bathroom, if available. If you need to be around other people in or outside of the home, wear a mask. 9. Avoid sharing personal items with other people in your household, like dishes, towels, and bedding. 10. Clean all surfaces that are touched often, like counters, tabletops, and doorknobs. Use household cleaning sprays or wipes according to the label instructions. [email or web address] 12/01/2018 This information is not intended to replace advice given to you by your health care provider. Make sure you discuss any questions you have with your health care provider. Document Revised: 05/05/2019 Document Reviewed: 05/05/2019 Elsevier Patient Education  2020 [place].   COVID-19 COVID-19 is a respiratory infection that is caused by a virus called severe acute respiratory syndrome coronavirus 2 (SARS-CoV-2). The disease is also known as coronavirus disease or novel coronavirus. In some people, the virus  may not cause any symptoms. In others, it may cause a serious infection. The infection can get worse quickly and can lead to complications, such as:  Pneumonia, or infection of the lungs.  Acute respiratory distress syndrome or ARDS. This is a condition in which fluid build-up in the lungs prevents the lungs from filling with air and passing oxygen into the blood.  Acute respiratory failure. This is a condition in which there is not enough oxygen passing from the lungs to the body or when carbon dioxide is not passing from the lungs out of the body.  Sepsis or septic shock. This is a serious bodily reaction to an infection.  Blood clotting problems.  Secondary infections due to bacteria or fungus.  Organ failure. This is when your body's organs stop working. The virus that causes COVID-19 is contagious. This means that it can spread from person to person through droplets from coughs and sneezes (respiratory secretions). What are the causes? This illness is caused by a virus. You may catch the virus by:  Breathing in droplets from an infected person. Droplets can be spread by a person breathing, speaking, singing, coughing, or sneezing.  Touching something, like a table or a doorknob, that was exposed to the virus (contaminated) and then touching your mouth, nose, or eyes. What increases the risk? Risk for infection You are more likely to be infected with this virus if you:  Are within 6 feet (2 meters) of a person with COVID-19.  Provide care for or live with a person who is infected with COVID-19.  Spend time in crowded indoor spaces or  live in shared housing. Risk for serious illness You are more likely to become seriously ill from the virus if you:  Are 11 years of age or older. The higher your age, the more you are at risk for serious illness.  Live in a nursing home or long-term care facility.  Have cancer.  Have a long-term (chronic) disease such as: ? Chronic lung  disease, including chronic obstructive pulmonary disease or asthma. ? A long-term disease that lowers your body's ability to fight infection (immunocompromised). ? Heart disease, including heart failure, a condition in which the arteries that lead to the heart become narrow or blocked (coronary artery disease), a disease which makes the heart muscle thick, weak, or stiff (cardiomyopathy). ? Diabetes. ? Chronic kidney disease. ? Sickle cell disease, a condition in which red blood cells have an abnormal "sickle" shape. ? Liver disease.  Are obese. What are the signs or symptoms? Symptoms of this condition can range from mild to severe. Symptoms may appear any time from 2 to 14 days after being exposed to the virus. They include:  A fever or chills.  A cough.  Difficulty breathing.  Headaches, body aches, or muscle aches.  Runny or stuffy (congested) nose.  A sore throat.  New loss of taste or smell. Some people may also have stomach problems, such as nausea, vomiting, or diarrhea. Other people may not have any symptoms of COVID-19. How is this diagnosed? This condition may be diagnosed based on:  Your signs and symptoms, especially if: ? You live in an area with a COVID-19 outbreak. ? You recently traveled to or from an area where the virus is common. ? You provide care for or live with a person who was diagnosed with COVID-19. ? You were exposed to a person who was diagnosed with COVID-19.  A physical exam.  Lab tests, which may include: ? Taking a sample of fluid from the back of your nose and throat (nasopharyngeal fluid), your nose, or your throat using a swab. ? A sample of mucus from your lungs (sputum). ? Blood tests.  Imaging tests, which may include, X-rays, CT scan, or ultrasound. How is this treated? At present, there is no medicine to treat COVID-19. Medicines that treat other diseases are being used on a trial basis to see if they are effective against  COVID-19. Your health care provider will talk with you about ways to treat your symptoms. For most people, the infection is mild and can be managed at home with rest, fluids, and over-the-counter medicines. Treatment for a serious infection usually takes places in a hospital intensive care unit (ICU). It may include one or more of the following treatments. These treatments are given until your symptoms improve.  Receiving fluids and medicines through an IV.  Supplemental oxygen. Extra oxygen is given through a tube in the nose, a face mask, or a hood.  Positioning you to lie on your stomach (prone position). This makes it easier for oxygen to get into the lungs.  Continuous positive airway pressure (CPAP) or bi-level positive airway pressure (BPAP) machine. This treatment uses mild air pressure to keep the airways open. A tube that is connected to a motor delivers oxygen to the body.  Ventilator. This treatment moves air into and out of the lungs by using a tube that is placed in your windpipe.  Tracheostomy. This is a procedure to create a hole in the neck so that a breathing tube can be inserted.  Extracorporeal membrane  oxygenation (ECMO). This procedure gives the lungs a chance to recover by taking over the functions of the heart and lungs. It supplies oxygen to the body and removes carbon dioxide. Follow these instructions at home: Lifestyle  If you are sick, stay home except to get medical care. Your health care provider will tell you how long to stay home. Call your health care provider before you go for medical care.  Rest at home as told by your health care provider.  Do not use any products that contain nicotine or tobacco, such as cigarettes, e-cigarettes, and chewing tobacco. If you need help quitting, ask your health care provider.  Return to your normal activities as told by your health care provider. Ask your health care provider what activities are safe for you. General  instructions  Take over-the-counter and prescription medicines only as told by your health care provider.  Drink enough fluid to keep your urine pale yellow.  Keep all follow-up visits as told by your health care provider. This is important. How is this prevented?  There is no vaccine to help prevent COVID-19 infection. However, there are steps you can take to protect yourself and others from this virus. To protect yourself:   Do not travel to areas where COVID-19 is a risk. The areas where COVID-19 is reported change often. To identify high-risk areas and travel restrictions, check the CDC travel website: wwwnc.cdc.gov/travel/notices  If you live in, or must travel to, an area where COVID-19 is a risk, take precautions to avoid infection. ? Stay away from people who are sick. ? Wash your hands often with soap and water for 20 seconds. If soap and water are not available, use an alcohol-based hand sanitizer. ? Avoid touching your mouth, face, eyes, or nose. ? Avoid going out in public, follow guidance from your state and local health authorities. ? If you must go out in public, wear a cloth face covering or face mask. Make sure your mask covers your nose and mouth. ? Avoid crowded indoor spaces. Stay at least 6 feet (2 meters) away from others. ? Disinfect objects and surfaces that are frequently touched every day. This may include:  Counters and tables.  Doorknobs and light switches.  Sinks and faucets.  Electronics, such as phones, remote controls, keyboards, computers, and tablets. To protect others: If you have symptoms of COVID-19, take steps to prevent the virus from spreading to others.  If you think you have a COVID-19 infection, contact your health care provider right away. Tell your health care team that you think you may have a COVID-19 infection.  Stay home. Leave your house only to seek medical care. Do not use public transport.  Do not travel while you are  sick.  Wash your hands often with soap and water for 20 seconds. If soap and water are not available, use alcohol-based hand sanitizer.  Stay away from other members of your household. Let healthy household members care for children and pets, if possible. If you have to care for children or pets, wash your hands often and wear a mask. If possible, stay in your own room, separate from others. Use a different bathroom.  Make sure that all people in your household wash their hands well and often.  Cough or sneeze into a tissue or your sleeve or elbow. Do not cough or sneeze into your hand or into the air.  Wear a cloth face covering or face mask. Make sure your mask covers your nose   and mouth. Where to find more information  Centers for Disease Control and Prevention: PurpleGadgets.be  World Health Organization: https://www.castaneda.info/ Contact a health care provider if:  You live in or have traveled to an area where COVID-19 is a risk and you have symptoms of the infection.  You have had contact with someone who has COVID-19 and you have symptoms of the infection. Get help right away if:  You have trouble breathing.  You have pain or pressure in your chest.  You have confusion.  You have bluish lips and fingernails.  You have difficulty waking from sleep.  You have symptoms that get worse. These symptoms may represent a serious problem that is an emergency. Do not wait to see if the symptoms will go away. Get medical help right away. Call your local emergency services (911 in the [place] Do not drive yourself to the hospital. Let the emergency medical personnel know if you think you have COVID-19. Summary  COVID-19 is a respiratory infection that is caused by a virus. It is also known as coronavirus disease or novel coronavirus. It can cause serious infections, such as pneumonia, acute respiratory distress syndrome, acute respiratory failure,  or sepsis.  The virus that causes COVID-19 is contagious. This means that it can spread from person to person through droplets from breathing, speaking, singing, coughing, or sneezing.  You are more likely to develop a serious illness if you are 10 years of age or older, have a weak immune system, live in a nursing home, or have chronic disease.  There is no medicine to treat COVID-19. Your health care provider will talk with you about ways to treat your symptoms.  Take steps to protect yourself and others from infection. Wash your hands often and disinfect objects and surfaces that are frequently touched every day. Stay away from people who are sick and wear a mask if you are sick. This information is not intended to replace advice given to you by your health care provider. Make sure you discuss any questions you have with your health care provider. Document Revised: 03/18/2019 Document Reviewed: 06/24/2018 Elsevier Patient Education  [place] Can Do to Manage Your COVID-19 Symptoms at Home If you have possible or confirmed COVID-19: 11. Stay home from work and school. And stay away from other public places. If you must go out, avoid using any kind of public transportation, ridesharing, or taxis. 12. Monitor your symptoms carefully. If your symptoms get worse, call your healthcare provider immediately. 13. Get rest and stay hydrated. 14. If you have a medical appointment, call the healthcare provider ahead of time and tell them that you have or may have COVID-19. 15. For medical emergencies, call 911 and notify the dispatch personnel that you have or may have COVID-19. 16. Cover your cough and sneezes with a tissue or use the inside of your elbow. 78. Wash your hands often with soap and water for at least 20 seconds or clean your hands with an alcohol-based hand sanitizer that contains at least 60% alcohol. 18. As much as possible, stay in a specific room and away from  other people in your home. Also, you should use a separate bathroom, if available. If you need to be around other people in or outside of the home, wear a mask. 19. Avoid sharing personal items with other people in your household, like dishes, towels, and bedding. 20. Clean all surfaces that are touched often, like counters, tabletops, and doorknobs. Use  household cleaning sprays or wipes according to the label instructions. [email or web address] 12/01/2018 This information is not intended to replace advice given to you by your health care provider. Make sure you discuss any questions you have with your health care provider. Document Revised: 05/05/2019 Document Reviewed: 05/05/2019 Elsevier Patient Education  [place].

## 2019-08-27 NOTE — Progress Notes (Signed)
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

## 2019-08-27 NOTE — Progress Notes (Addendum)
Peter Pearson  MRN: [id number]  DOB/AGE: [birth year] 64 y.o.  Primary Care Physician:System, Pcp Not In  Admit date: 08/23/2019  Chief Complaint:  Chief Complaint  Patient presents with  . Tachycardia    S-Pt presented on  08/23/2019 with  Chief Complaint  Patient presents with  . Tachycardia  .    Pt today feels better.    Patient main questioning today's visit was he was worried about his family having Covid as well  Medications . allopurinol  50 mg Oral QODAY  . apixaban  10 mg Oral BID   Followed by  . [START ON 09/02/2019] apixaban  5 mg Oral BID  . vitamin C  500 mg Oral Daily  . azithromycin  250 mg Oral Daily  . Chlorhexidine Gluconate Cloth  6 each Topical Daily  . cholecalciferol  1,000 Units Oral Daily  . dexamethasone (DECADRON) injection  6 mg Intravenous Q24H  . diltiazem  240 mg Oral Daily  . epoetin (EPOGEN/PROCRIT) injection  4,000 Units Intravenous Q T,Th,Sa-HD  . furosemide  40 mg Intravenous BID  . influenza vac split quadrivalent PF  0.5 mL Intramuscular Tomorrow-1000  . insulin aspart  0-5 Units Subcutaneous QHS  . insulin aspart  0-9 Units Subcutaneous TID WC  . insulin aspart  3 Units Subcutaneous TID WC  . insulin detemir  0.075 Units/kg Subcutaneous BID  . linagliptin  5 mg Oral Daily  . midodrine  5 mg Oral Q T,Th,Sat-1800  . pyridOXINE  50 mg Oral Daily  . rosuvastatin  10 mg Oral Daily  . tamsulosin  0.4 mg Oral Daily  . zinc sulfate  220 mg Oral Daily         WUJ:WJXBJ from the symptoms mentioned above,there are no other symptoms referable to all systems reviewed.  Physical Exam: Vital signs in last 24 hours: Temp:  [97.7 F (36.5 C)] 97.7 F (36.5 C) (03/27 0013) Pulse Rate:  [57-63] 57 (03/27 0013) Resp:  [18-19] 18 (03/27 0013) BP: (111-124)/(57-61) 111/61 (03/27 0013) SpO2:  [98 %-100 %] 100 % (03/27 0013) Weight change:  Last BM Date: 08/24/19  Intake/Output from previous day: 03/26 0701 - 03/27 0700 In: 790.2  [P.O.:480; IV Piggyback:310.2] Out: -  No intake/output data recorded.   Physical Exam: General- pt is awake,alert, oriented to time place and person Resp- No acute REsp distress,  Rhonchi+ CVS- S1S2 regular in rate and rhythm GIT- BS+, soft, NT, ND EXT- NO LE Edema, Cyanosis Access tunneled catheter in situ  Lab Results: CBC Recent Labs    08/26/19 0419 08/27/19 0816  WBC 10.8* 10.6*  HGB 8.7* 9.3*  HCT 26.1* 28.6*  PLT 59* 68*    BMET Recent Labs    08/26/19 0419 08/27/19 0816  NA 139 135  K 4.1 4.1  CL 102 98  CO2 23 19*  GLUCOSE 181* 177*  BUN 77* 100*  CREATININE 5.72* 6.54*  CALCIUM 8.0* 7.9*    MICRO No results found for this or any previous visit (from the past 240 hour(s)).    Lab Results  Component Value Date   PTH 349 (H) 08/08/2019   CALCIUM 7.9 (L) 08/27/2019   PHOS 6.8 (H) 08/27/2019               Impression:  64 y.o. 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 Patient had a tunnel catheter placed on August 08, 2019. Patient first day of dialysis was August 09, 2019. Patient is to be dialyzed today.  2)HTN Blood pressure is stable Patient is on calcium blockers and diuretics  3)Anemia of chronic disease  HGb at goal (9--11)   4) secondary hyperparathyroidism -CKD Mineral-Bone Disorder   Secondary Hyperparathyroidism present  Phosphorus is not at goal. 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 Patient is on anticoagulants. I  did discuss with the dialysis nurse about not to give any heparin and to pack his catheter with citrate  Plan:   We will dialyze patient today Educated patient at length about different issues related to his ESRD/low phosphorus diet /possibly being a candidate for transplant/new permacath need to lose weight and not to shower as patient has a tunneled catheter. I answered patient condition the best of my ability.    Manpreet s Theador Hawthorne 08/27/2019, 10:02 AM

## 2019-08-27 NOTE — Progress Notes (Signed)
Post HD Tx Note  Pt tolerated well the Tx, prescribed UG goal of 1036m was met. Pt continues to be on RA SPO2 100%. BP continued to be WDL. Pt reports no chest pain or SOB. CVC limbs are Sodium Citrate  Anticoagulant Intra-catheter Locked due to HIT as per Dr. BTheador HawthorneOrder. Pt requested tx to end 15 min earlier than prescribed time so he can leave with his wife after Tx.    08/27/19 2307  Hand-Off documentation  Report given to (Full Name) MPrentiss BellsRN   Report received from (Full Name) DNewt MinionRN   Vital Signs  Temp 98.2 F (36.8 C)  Temp Source Oral  Pulse Rate 64  Pulse Rate Source Monitor  Resp 16  BP (!) 125/50  Oxygen Therapy  SpO2 99 %  O2 Device Room Air  Post-Hemodialysis Assessment  Rinseback Volume (mL) 250 mL  KECN 48.2 V  Dialyzer Clearance Lightly streaked  Duration of HD Treatment -hour(s) 2.15 hour(s)  Hemodialysis Intake (mL) 500 mL  UF Total -Machine (mL) 1000 mL  Net UF (mL) 500 mL  Tolerated HD Treatment Yes  Hemodialysis Catheter Right Internal jugular Double lumen Permanent (Tunneled)  Placement Date/Time: 08/08/19 1000   Time Out: Correct patient;Correct site;Correct procedure  Maximum sterile barrier precautions: Hand hygiene;Large sterile sheet;Cap;Mask;Sterile gown;Sterile gloves  Site Prep: Chlorhexidine (preferred)  Ultrasound...  Site Condition No complications  Blue Lumen Status Other (Comment);Flushed (Anticoagulant Sodium Citrate )  Red Lumen Status Other (Comment);Flushed (Anticoagulant Sodium Citrate )  Purple Lumen Status N/A  Catheter fill solution Other (Comment) (Anticoagulant Sodium Citrate )  Catheter fill volume (Arterial) 1.7 cc  Catheter fill volume (Venous) 1.7  Dressing Type Biopatch  Dressing Status Dressing changed;Antimicrobial disc changed  Interventions New dressing  Drainage Description None  Dressing Change Due 08/31/19  Post treatment catheter status Capped and Clamped

## 2019-08-27 NOTE — Progress Notes (Signed)
Pre HD Tx Note  Pt is ready to start HD Tx. A*4. On RA SPO2 98%. Pt reports no chest pain or SOB. BP WDL Tx should run for 2.5hrs on 3K2.5Ca. CVC dressing changed.    08/27/19 2030  Hand-Off documentation  Report given to (Full Name) Newt Minion RN   Report received from (Full Name) Prentiss Bells RN  Vital Signs  Temp 98 F (36.7 C)  Temp Source Oral  Pulse Rate (!) 59  Pulse Rate Source Monitor  Resp (!) 23  BP (!) 118/54  BP Location Right Arm  BP Method Automatic  Patient Position (if appropriate) Lying  Oxygen Therapy  SpO2 98 %  O2 Device Room Air  Pain Assessment  Pain Scale 0-10  Pain Score 0  Dialysis Weight  Weight 125.6 kg  Type of Weight Pre-Dialysis  Time-Out for Hemodialysis  What Procedure? HD  Pt Identifiers(min of two) MRN/Account#;First/Last Name  Correct Site? Yes  Correct Side? Yes  Correct Procedure? Yes  Consents Verified? Yes  Safety Precautions Reviewed? Yes  Engineer, civil (consulting) Number 3  Station Number  [id number])  UF/Alarm Test Passed  Conductivity: Meter 13.6  Conductivity: Machine  13.7  pH 7.4  Reverse Osmosis  (RO 1)  Normal Saline Lot Number [id number]  Machine Temperature 97.9 F (36.6 C)  Musician and Audible Yes  Blood Lines Intact and Secured Yes

## 2019-08-27 NOTE — Discharge Summary (Addendum)
Physician Discharge Summary  Peter Pearson YHC:[id number] DOB: [birth year] DOA: 08/23/2019  PCP: Peter Pearson, Pcp Not In  Admit date: 08/23/2019 Discharge date: 08/27/2019  Admitted From: Home Disposition:  Home  Recommendations for Outpatient Follow-up:  1. Follow up with PCP in 1-2 weeks 2. Follow-up with hematologist in 2 weeks. 3. Please obtain BMP/CBC in one week 4. Please follow up on the following pending results:None 5. Patient needs lab drawn for CBC monitoring during dialysis and fax to Dr. Tasia Catchings.  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  He denies any chest pain or dyspnea but did had some diarrhea a day prior to admission.  No loss of taste or smell.  Chest x-ray without any acute cardiopulmonary disease.  Labs significant for platelet of 19 which was 229 on 08/11/2019. No sign of any active bleeding and elevated troponin.  Markedly elevated inflammatory markers.  D-dimer remains above 7500 with improvement in CRP.  VQ scan without any perfusion defect.  Patient did not required any oxygen.  He received platelet transfusions during hospitalization.  His platelet on discharge day was 68. 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  Patient without any evidence of thrombosis.  He was started on Eliquis 10 mg twice daily for 1 week and then he  will continue with 5 mg twice daily for another 21 days to complete a 4-week course.  He will have his lab drawn during his dialysis, that should be faxed to his hematologist for close monitoring.  He will follow-up with his hematologist in 2 weeks after finishing his quarantine time.  He completed the course of remdesivir while in the hospital.  He was sent home for 5 more days of Decadron.  He was also started on CAP coverage with ceftriaxone and azithromycin due to leukocytosis and elevated procalcitonin levels.  He will complete his 5-day course with cefdinir and azithromycin at home.  He did had elevated troponin on admission which started trending down.  Most likely secondary to demand ischemia as patient did not had any chest pain or changes in his EKG.  He has well-controlled diabetes with A1c of 6.3.  CBG were elevated due to steroid use.  It was managed with Levemir and NovoLog.  He was started on Tradjenta and discharged home with Levemir to be used for at least couple of weeks due to steroid.  Patient was little reluctant to use insulin.  He will follow-up with his primary care physician for further management.  Patient to have normocytic anemia.  Iron studies were consistent with anemia due to chronic disease most likely his ESRD.  He was started on EPO with dialysis. His reticulocyte counts were abnormally low, with low immature platelet fraction which were also consistent with bone marrow suppression most likely secondary to COVID-19 infection.  He will continue with rest of his home meds and CPAP at bedtime. He will continue with his dialysis  as scheduled.  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 As we discussed you will take antibiotics for 3 more days. Due to your allergies to Heparin, we started you on a blood thinner called Eliquis for 4 weeks.  You will take 10 mg twice daily for 1 week and then 5 mg twice daily for another 3 weeks.  You need to have your levels tested during your dialysis and being faxed to your hematologist Dr. Tasia Catchings.  You will follow-up with her in 2 weeks after your quarantine ends. You will take Decadron for 5 more days.  Continue taking insulin twice daily as directed as steroid can worsen your blood glucose level.  Continue checking your blood glucose 3-4 times a day, your primary care physician can make changes to your current regimen as needed once you are finished with your steroid. Follow-up with your primary care physician in 1 to 2 weeks.   Increase activity slowly   Complete by: As directed      Allergies as of 08/27/2019      Reactions   Heparin    HIT Positive on 08/25/2019 (HIT Ab + and SRA +)    Amoxicillin-pot Clavulanate Rash      Medication List    TAKE these medications   allopurinol 100 MG tablet Commonly known as: ZYLOPRIM Take 100 mg by mouth daily.   apixaban 5 MG Tabs tablet Commonly known as: ELIQUIS Take 2 tablets (10 mg total) by mouth 2 (two) times daily. For 1 week and then take 1 tablet / 5 mg twice daily for 3 more weeks.   ascorbic acid 500 MG tablet Commonly known as: VITAMIN C Take 1 tablet (500 mg total) by mouth daily. Start taking on: August 28, 2019   azithromycin 250 MG tablet Commonly known as: ZITHROMAX Take 1 pill of 250 mg daily for next 3 days. Start taking on: August 28, 2019   cefdinir 300 MG capsule Commonly known as: OMNICEF Take 1 capsule (300 mg total) by mouth 2 (two) times daily for 3 days.   chlorpheniramine-HYDROcodone 10-8 MG/5ML Suer Commonly known as: TUSSIONEX Take 5 mLs by mouth every 12 (twelve) hours as  needed for cough.   dexamethasone 6 MG tablet Commonly known as: DECADRON Take 1 tablet (6 mg total) by mouth daily for 5 days.   diltiazem 240 MG 24 hr capsule Commonly known as: CARDIZEM CD Take 240 mg by mouth daily.   insulin detemir 100 UNIT/ML injection Commonly known as: LEVEMIR Inject 0.09 mLs (9 Units total) into the skin 2 (two) times daily.   linagliptin 5 MG Tabs tablet Commonly known as: TRADJENTA Take 1 tablet (5 mg total) by mouth daily. Start taking on: August 28, 2019   midodrine 5 MG tablet Commonly known as: PROAMATINE Take 5 mg of midodrine on dialysis days (Tuesday, Thursday, and Saturday)   pyridOXINE 50 MG tablet Commonly known as: VITAMIN B-6 Take 50-100 mg by mouth daily.   rosuvastatin 20 MG tablet Commonly known as: CRESTOR Take 10 mg by mouth daily.   tamsulosin 0.4 MG Caps capsule Commonly  known as: FLOMAX Take 0.4 mg by mouth daily.   Vitamin D3 25 MCG tablet Commonly known as: Vitamin D Take 1 tablet (1,000 Units total) by mouth daily. Start taking on: August 28, 2019   zinc sulfate 220 (50 Zn) MG capsule Take 1 capsule (220 mg total) by mouth daily. Start taking on: August 28, 2019       Allergies  Allergen Reactions  . Heparin     HIT Positive on 08/25/2019 (HIT Ab + and SRA +)   . Amoxicillin-Pot Clavulanate Rash    Consultations:  Nephrology  Hematology  Procedures/Studies: DG Chest 2 View  Result Date: 08/26/2019 CLINICAL DATA:  History of COVID-19 positivity with persistent cough EXAM: CHEST - 2 VIEW COMPARISON:  08/23/2019 FINDINGS: Cardiac shadow is stable. Dialysis catheter is again seen. Patchy increased opacities are noted bilaterally consistent with the given clinical history of COVID-19 positivity. No sizable effusion is noted. No bony abnormality is seen. IMPRESSION: Patchy opacities consistent with the given clinical history of COVID-19 positivity Electronically Signed   By: Inez Catalina M.D.   On: 08/26/2019  09:20   DG Chest 2 View  Result Date: 08/23/2019 CLINICAL DATA:  Shortness of breath EXAM: CHEST - 2 VIEW COMPARISON:  08/09/2019 FINDINGS: Right tunneled dialysis catheter is unchanged. No pleural effusion or pneumothorax. Stable cardiomediastinal contours. No new consolidation or edema. No acute osseous abnormality. IMPRESSION: No acute process in the chest. Electronically Signed   By: Macy Mis M.D.   On: 08/23/2019 14:18   DG Chest 2 View  Result Date: 08/09/2019 CLINICAL DATA:  Shortness of breath EXAM: CHEST - 2 VIEW COMPARISON:  08/07/2018 FINDINGS: Cardiac shadow is prominent but stable. New right jugular central line is noted in satisfactory position. No pneumothorax is seen. No focal infiltrate is seen. No bony abnormality is noted. IMPRESSION: No acute abnormality noted. Electronically Signed   By: Inez Catalina M.D.   On: 08/09/2019 15:55   NM Pulmonary Perfusion  Result Date: 08/24/2019 CLINICAL DATA:  Hypoxia EXAM: NUCLEAR MEDICINE PERFUSION LUNG SCAN TECHNIQUE: Perfusion images were obtained in multiple projections after intravenous injection of radiopharmaceutical. Ventilation scans intentionally deferred if perfusion scan and chest x-ray adequate for interpretation during COVID 19 epidemic. Views: Anterior, posterior, left lateral, right lateral, RPO, LPO, RAO, LAO RADIOPHARMACEUTICALS:  3.84 mCi Tc-27m MAA IV COMPARISON:  Chest radiograph August 23, 2019 FINDINGS: Radiotracer uptake is homogeneous and symmetric bilaterally. No evident perfusion defects. IMPRESSION: No perfusion defects.  Very low probability of pulmonary embolus. Electronically Signed   By: Lowella Grip III M.D.   On: 08/24/2019 13:56   PERIPHERAL VASCULAR CATHETERIZATION  Result Date: 08/08/2019 See op note  DG Chest Port 1 View  Result Date: 08/07/2019 CLINICAL DATA:  Shortness of breath for a month, denies chest pain and cough EXAM: PORTABLE CHEST 1 VIEW COMPARISON:  Radiograph 06/08/2018, 10/12/2000  FINDINGS: Slightly bulbous configuration of the right main pulmonary artery, could reflect some right hilar adenopathy or pulmonary arterial congestion. No consolidative opacity is seen. Lung volumes are low with hazy interstitial changes and cephalized vascular markings. Cardiac silhouette is upper limits normal. No acute osseous or soft tissue abnormality. Telemetry leads overlie the chest. IMPRESSION: Features of central vascular congestion. Convex appearance of the right main pulmonary artery/hilum, nonspecific and possibly artifactual but could reflect hilar adenopathy, consider CT imaging. Electronically Signed   By: Lovena Le M.D.   On: 08/07/2019 15:16   ECHOCARDIOGRAM COMPLETE  Result Date: 08/24/2019    ECHOCARDIOGRAM  REPORT   Patient Name:   YECHIEL ERNY Date of Exam: 08/24/2019 Medical Rec #:  [id number]      Height:       68.0 in Accession #:    [id number]     Weight:       276.0 lb Date of Birth:  Feb 17, 1956      BSA:          2.344 m Patient Age:    54 years       BP:           114/56 mmHg Patient Gender: M              HR:           79 bpm. Exam Location:  [facility] Procedure: Color Doppler and Cardiac Doppler Indications:     Elevated troponin  History:         Patient has no prior history of Echocardiogram examinations.                  Risk Factors:Hypertension and Sleep Apnea.  Sonographer:     Sherrie Sport RDCS (AE) Referring Phys:  [id number] Arvella Merles MANSY Diagnosing Phys: Kate Sable MD  Sonographer Comments: No apical window. IMPRESSIONS  1. Left ventricular ejection fraction, by estimation, is 60 to 65%. The left ventricle has normal function. The left ventricle has no regional wall motion abnormalities. There is mild left ventricular hypertrophy. Left ventricular diastolic function could not be evaluated.  2. Right ventricular systolic function is normal. The right ventricular size is normal.  3. The mitral valve is grossly normal. No evidence of mitral valve regurgitation.  4. The aortic  valve is tricuspid. Aortic valve regurgitation is not visualized. Mild to moderate aortic valve sclerosis/calcification is present, without any evidence of aortic stenosis.  5. Aortic dilatation noted. There is mild dilatation of the aortic root measuring 42 mm. FINDINGS  Left Ventricle: Left ventricular ejection fraction, by estimation, is 60 to 65%. The left ventricle has normal function. The left ventricle has no regional wall motion abnormalities. The left ventricular internal cavity size was normal in size. There is  mild left ventricular hypertrophy. Left ventricular diastolic function could not be evaluated. Right Ventricle: The right ventricular size is normal. No increase in right ventricular wall thickness. Right ventricular systolic function is normal. Left Atrium: Left atrial size was normal in size. Right Atrium: Right atrial size was not well visualized. Pericardium: There is no evidence of pericardial effusion. Mitral Valve: The mitral valve is grossly normal. No evidence of mitral valve regurgitation. Tricuspid Valve: The tricuspid valve is grossly normal. Tricuspid valve regurgitation is not demonstrated. Aortic Valve: The aortic valve is tricuspid. Aortic valve regurgitation is not visualized. Mild to moderate aortic valve sclerosis/calcification is present, without any evidence of aortic stenosis. Pulmonic Valve: The pulmonic valve was not well visualized. Pulmonic valve regurgitation is trivial. Aorta: Aortic dilatation noted. There is mild dilatation of the aortic root measuring 42 mm. Venous: The inferior vena cava was not well visualized. IAS/Shunts: The interatrial septum was not well visualized.  LEFT VENTRICLE PLAX 2D LVIDd:         5.14 cm LVIDs:         2.65 cm LV PW:         1.39 cm LV IVS:        1.30 cm LVOT diam:     2.20 cm LVOT Area:     3.80 cm  LEFT ATRIUM  Index LA diam:    3.40 cm 1.45 cm/m                        PULMONIC VALVE AORTA                 PV Vmax:        0.89  m/s Ao Root diam: 4.10 cm PV Peak grad:   3.2 mmHg                       RVOT Peak grad: 14 mmHg  TRICUSPID VALVE TR Peak grad:   21.7 mmHg TR Vmax:        233.00 cm/s  SHUNTS Systemic Diam: 2.20 cm Kate Sable MD Electronically signed by Kate Sable MD Signature Date/Time: 08/24/2019/11:50:30 AM    Final      Subjective: Patient was feeling better when seen today.  No new complaints.  He was eager to go home.  Discharge Exam: Vitals:   08/27/19 1000 08/27/19 1019  BP:  (!) 108/53  Pulse: 75 75  Resp: (!) 21   Temp:    SpO2: 96%    Vitals:   08/27/19 0800 08/27/19 0900 08/27/19 1000 08/27/19 1019  BP: (!) 108/53   (!) 108/53  Pulse: 75  75 75  Resp: (!) 25 16 (!) 21   Temp: 97.8 F (36.6 C)     TempSrc: Oral     SpO2: 97%  96%   Weight:      Height:        General: Pt is alert, awake, not in acute distress Cardiovascular: RRR, S1/S2 +, no rubs, no gallops Respiratory: CTA bilaterally, no wheezing, no rhonchi Abdominal: Soft, NT, ND, bowel sounds + Extremities: no edema, no cyanosis   The results of significant diagnostics from this hospitalization (including imaging, microbiology, ancillary and laboratory) are listed below for reference.    Microbiology: No results found for this or any previous visit (from the past 240 hour(s)).   Labs: BNP (last 3 results) Recent Labs    08/23/19 1623  BNP 841.6*   Basic Metabolic Panel: Recent Labs  Lab 08/23/19 1350 08/23/19 1624 08/24/19 0613 08/25/19 0544 08/26/19 0419 08/27/19 0816  NA 136  --  137 135 139 135  K 3.4*  --  4.2 4.2 4.1 4.1  CL 102  --  103 103 102 98  CO2 22  --  20* 19* 23 19*  GLUCOSE 140*  --  186* 204* 181* 177*  BUN 43*  --  59* 86* 77* 100*  CREATININE 5.33*  --  6.78* 7.40* 5.72* 6.54*  CALCIUM 7.7*  --  7.7* 7.8* 8.0* 7.9*  MG  --  2.1  --  2.1 2.0 2.1  PHOS  --  4.0  --  7.1* 5.2* 6.8*   Liver Function Tests: Recent Labs  Lab 08/23/19 1624 08/24/19 0613 08/25/19 0544  08/26/19 0419 08/27/19 0816  AST 40 35 37 32 22  ALT 35 31 31 30 29   ALKPHOS 53 52 45 45 58  BILITOT 0.6 0.4 0.4 0.5 0.4  PROT 6.9 6.6 6.0* 5.8* 6.2*  ALBUMIN 3.1* 2.9* 2.6* 2.6* 2.8*   No results for input(s): LIPASE, AMYLASE in the last 168 hours. No results for input(s): AMMONIA in the last 168 hours. CBC: Recent Labs  Lab 08/23/19 1602 08/23/19 1602 08/24/19 0613 08/24/19 1358 08/25/19 0544 08/26/19 0419 08/27/19 0816  WBC 7.8  --  7.3  --  6.4 10.8* 10.6*  NEUTROABS 5.8  --   --   --  5.3 9.7* 9.2*  HGB 8.2*  --  7.7*  --  7.2* 8.7* 9.3*  HCT 26.0*   < > 24.6* 23.4* 22.4* 26.1* 28.6*  MCV 90.9  --  90.4  --  88.5 87.0 86.9  PLT 20*  --  53*  --  64* 59* 68*   < > = values in this interval not displayed.   Cardiac Enzymes: No results for input(s): CKTOTAL, CKMB, CKMBINDEX, TROPONINI in the last 168 hours. BNP: Invalid input(s): POCBNP CBG: Recent Labs  Lab 08/25/19 2036 08/26/19 0834 08/26/19 1718 08/26/19 2137 08/27/19 1214  GLUCAP 190* 159* 256* 228* 242*   D-Dimer No results for input(s): DDIMER in the last 72 hours. Hgb A1c No results for input(s): HGBA1C in the last 72 hours. Lipid Profile No results for input(s): CHOL, HDL, LDLCALC, TRIG, CHOLHDL, LDLDIRECT in the last 72 hours. Thyroid function studies No results for input(s): TSH, T4TOTAL, T3FREE, THYROIDAB in the last 72 hours.  Invalid input(s): FREET3 Anemia work up [employer]    08/26/19 0419 08/27/19 0816  FERRITIN 472* 563*   Urinalysis No results found for: COLORURINE, APPEARANCEUR, LABSPEC, [place], GLUCOSEU, HGBUR, BILIRUBINUR, KETONESUR, PROTEINUR, UROBILINOGEN, NITRITE, LEUKOCYTESUR Sepsis Labs Invalid input(s): PROCALCITONIN,  WBC,  LACTICIDVEN Microbiology No results found for this or any previous visit (from the past 240 hour(s)).  Time coordinating discharge: Over 30 minutes  SIGNED:  Lorella Nimrod, MD  [facility] 08/27/2019, 3:07 PM  If 7PM-7AM, please  contact night-coverage [email or web address]  This record has been created using Systems analyst. Errors have been sought and corrected,but may not always be located. Such creation errors do not reflect on the standard of care.

## 2019-08-27 NOTE — Progress Notes (Signed)
HD Tx Completed   08/27/19 2300  Vital Signs  Pulse Rate (!) 59  Resp (!) 21  BP (!) 135/51  Oxygen Therapy  SpO2 99 %  O2 Device Room Air  During Hemodialysis Assessment  Blood Flow Rate (mL/min) 400 mL/min  Arterial Pressure (mmHg) -170 mmHg  Venous Pressure (mmHg) 130 mmHg  Transmembrane Pressure (mmHg) 60 mmHg  Ultrafiltration Rate (mL/min) 400 mL/min  Dialysate Flow Rate (mL/min) 600 ml/min  Conductivity: Machine  13.9  HD Safety Checks Performed Yes  Intra-Hemodialysis Comments Tx completed;Tolerated well

## 2019-08-27 NOTE — Progress Notes (Signed)
HD Tx Started    08/27/19 2045  Vital Signs  Pulse Rate 62  Resp (!) 23  BP (!) 117/53  Oxygen Therapy  SpO2 98 %  O2 Device Room Air  During Hemodialysis Assessment  Blood Flow Rate (mL/min) 400 mL/min  Arterial Pressure (mmHg) -180 mmHg  Venous Pressure (mmHg) 150 mmHg  Transmembrane Pressure (mmHg) 60 mmHg  Ultrafiltration Rate (mL/min) 440 mL/min  Dialysate Flow Rate (mL/min) 600 ml/min  Conductivity: Machine  13.9  HD Safety Checks Performed Yes  Dialysis Fluid Bolus Normal Saline  Bolus Amount (mL) 250 mL  Intra-Hemodialysis Comments Tx initiated

## 2019-08-28 NOTE — Progress Notes (Signed)
Pt  Dialysis completed.  Peripheral IV taken out. Discharge teaching done regarding new medications, diet , follow up appointments with Nephrology and Oncology.   Pt alert and oriented, denies pain . Vital signs stable upon discharge. Wife to pick him up

## 2019-09-07 DIAGNOSIS — N186 End stage renal disease: Secondary | ICD-10-CM | POA: Insufficient documentation

## 2019-09-09 ENCOUNTER — Encounter: Payer: Self-pay | Admitting: Oncology

## 2019-09-09 ENCOUNTER — Other Ambulatory Visit: Payer: Self-pay

## 2019-09-09 DIAGNOSIS — D631 Anemia in chronic kidney disease: Secondary | ICD-10-CM

## 2019-09-09 DIAGNOSIS — N185 Chronic kidney disease, stage 5: Secondary | ICD-10-CM

## 2019-09-09 NOTE — Progress Notes (Signed)
Patient contacted for New pt visit. No concerns voiced. Gave him directions to cancer center and told him that he was going to get labs prior to seeing MD.

## 2019-09-10 ENCOUNTER — Other Ambulatory Visit
Admission: RE | Admit: 2019-09-10 | Discharge: 2019-09-10 | Disposition: A | Discharge status: Home / Self Care | Payer: PRIVATE HEALTH INSURANCE | Source: Other Acute Inpatient Hospital | Attending: Nephrology | Admitting: Nephrology

## 2019-09-10 DIAGNOSIS — N186 End stage renal disease: Secondary | ICD-10-CM | POA: Diagnosis present

## 2019-09-10 LAB — CBC WITH DIFFERENTIAL/PLATELET
Abs Immature Granulocytes: 0.08 10*3/uL — ABNORMAL HIGH (ref 0.00–0.07)
Basophils Absolute: 0 10*3/uL (ref 0.0–0.1)
Basophils Relative: 0 %
Eosinophils Absolute: 0.2 10*3/uL (ref 0.0–0.5)
Eosinophils Relative: 2 %
HCT: 26 % — ABNORMAL LOW (ref 39.0–52.0)
Hemoglobin: 8.6 g/dL — ABNORMAL LOW (ref 13.0–17.0)
Immature Granulocytes: 1 %
Lymphocytes Relative: 13 %
Lymphs Abs: 1.2 10*3/uL (ref 0.7–4.0)
MCH: 29.6 pg (ref 26.0–34.0)
MCHC: 33.1 g/dL (ref 30.0–36.0)
MCV: 89.3 fL (ref 80.0–100.0)
Monocytes Absolute: 0.7 10*3/uL (ref 0.1–1.0)
Monocytes Relative: 8 %
Neutro Abs: 6.9 10*3/uL (ref 1.7–7.7)
Neutrophils Relative %: 76 %
Platelets: 60 10*3/uL — ABNORMAL LOW (ref 150–400)
RBC: 2.91 MIL/uL — ABNORMAL LOW (ref 4.22–5.81)
RDW: 14.1 % (ref 11.5–15.5)
WBC: 9.2 10*3/uL (ref 4.0–10.5)
nRBC: 0 % (ref 0.0–0.2)

## 2019-09-12 ENCOUNTER — Other Ambulatory Visit: Payer: Self-pay

## 2019-09-12 ENCOUNTER — Ambulatory Visit
Admission: RE | Admit: 2019-09-12 | Discharge: 2019-09-12 | Disposition: A | Discharge status: Home / Self Care | Payer: PRIVATE HEALTH INSURANCE | Source: Ambulatory Visit | Attending: Oncology | Admitting: Oncology

## 2019-09-12 ENCOUNTER — Other Ambulatory Visit: Payer: Self-pay | Admitting: Oncology

## 2019-09-12 ENCOUNTER — Encounter (INDEPENDENT_AMBULATORY_CARE_PROVIDER_SITE_OTHER): Payer: Self-pay

## 2019-09-12 ENCOUNTER — Telehealth: Payer: Self-pay | Admitting: *Deleted

## 2019-09-12 ENCOUNTER — Inpatient Hospital Stay: Payer: PRIVATE HEALTH INSURANCE | Attending: Oncology

## 2019-09-12 ENCOUNTER — Inpatient Hospital Stay (HOSPITAL_BASED_OUTPATIENT_CLINIC_OR_DEPARTMENT_OTHER): Payer: PRIVATE HEALTH INSURANCE | Admitting: Oncology

## 2019-09-12 VITALS — BP 111/58 | HR 82 | Temp 99.1°F | Resp 18 | Ht 68.9 in | Wt 271.1 lb

## 2019-09-12 DIAGNOSIS — D631 Anemia in chronic kidney disease: Secondary | ICD-10-CM

## 2019-09-12 DIAGNOSIS — D7582 Heparin induced thrombocytopenia (HIT): Secondary | ICD-10-CM | POA: Insufficient documentation

## 2019-09-12 DIAGNOSIS — D75829 Heparin-induced thrombocytopenia, unspecified: Secondary | ICD-10-CM

## 2019-09-12 DIAGNOSIS — N185 Chronic kidney disease, stage 5: Secondary | ICD-10-CM | POA: Diagnosis not present

## 2019-09-12 DIAGNOSIS — Z992 Dependence on renal dialysis: Secondary | ICD-10-CM | POA: Insufficient documentation

## 2019-09-12 DIAGNOSIS — Z862 Personal history of diseases of the blood and blood-forming organs and certain disorders involving the immune mechanism: Secondary | ICD-10-CM | POA: Diagnosis present

## 2019-09-12 DIAGNOSIS — N186 End stage renal disease: Secondary | ICD-10-CM | POA: Insufficient documentation

## 2019-09-12 DIAGNOSIS — I120 Hypertensive chronic kidney disease with stage 5 chronic kidney disease or end stage renal disease: Secondary | ICD-10-CM | POA: Insufficient documentation

## 2019-09-12 DIAGNOSIS — Z79899 Other long term (current) drug therapy: Secondary | ICD-10-CM | POA: Insufficient documentation

## 2019-09-12 DIAGNOSIS — Z7901 Long term (current) use of anticoagulants: Secondary | ICD-10-CM | POA: Insufficient documentation

## 2019-09-12 LAB — CBC WITH DIFFERENTIAL/PLATELET
Abs Immature Granulocytes: 0.05 10*3/uL (ref 0.00–0.07)
Basophils Absolute: 0 10*3/uL (ref 0.0–0.1)
Basophils Relative: 0 %
Eosinophils Absolute: 0.3 10*3/uL (ref 0.0–0.5)
Eosinophils Relative: 5 %
HCT: 24.3 % — ABNORMAL LOW (ref 39.0–52.0)
Hemoglobin: 7.6 g/dL — ABNORMAL LOW (ref 13.0–17.0)
Immature Granulocytes: 1 %
Lymphocytes Relative: 20 %
Lymphs Abs: 1.1 10*3/uL (ref 0.7–4.0)
MCH: 28.6 pg (ref 26.0–34.0)
MCHC: 31.3 g/dL (ref 30.0–36.0)
MCV: 91.4 fL (ref 80.0–100.0)
Monocytes Absolute: 0.4 10*3/uL (ref 0.1–1.0)
Monocytes Relative: 7 %
Neutro Abs: 3.8 10*3/uL (ref 1.7–7.7)
Neutrophils Relative %: 67 %
Platelets: 67 10*3/uL — ABNORMAL LOW (ref 150–400)
RBC: 2.66 MIL/uL — ABNORMAL LOW (ref 4.22–5.81)
RDW: 14 % (ref 11.5–15.5)
WBC: 5.5 10*3/uL (ref 4.0–10.5)
nRBC: 0 % (ref 0.0–0.2)

## 2019-09-12 LAB — IMMATURE PLATELET FRACTION: Immature Platelet Fraction: 4.1 % (ref 1.2–8.6)

## 2019-09-12 LAB — FIBRIN DERIVATIVES D-DIMER (ARMC ONLY): Fibrin derivatives D-dimer (ARMC): 2950.19 ng/mL (FEU) — ABNORMAL HIGH (ref 0.00–499.00)

## 2019-09-12 LAB — FIBRINOGEN: Fibrinogen: 532 mg/dL — ABNORMAL HIGH (ref 210–475)

## 2019-09-12 NOTE — Progress Notes (Signed)
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 Patient was admitted from 08/23/2019-08/27/2019 due to COVID-19 pneumonia, respiratory failure with  hypoxia.  Patient was found to have acute thrombocytopenia with a count of 19,000.  Work-up for HIT was sent.  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 Patient was started on Eliquis 10 mg twice daily, I recommended him to continue Eliquis 10 mg twice daily for 7 days or until platelet recovers to be above 1 50,000, whichever is longer, then switch to Eliquis 5 mg twice daily. Due to recent COVID-19 infection, he is not able to come to cancer center to have blood draws for 2 weeks.  I discussed with patient's nephrologist Dr. Holley Raring and hospitalist and I recommend patient to have CBC done when he gets dialysis, approximately 3 days and 7 days  after discharge and have the results sent to my office. Patient had blood work done on 08/30/2019.  I did not get results faxed over to me and to 1 week after that.  Results reviewed, platelet count continues to be low at 40,000.  I communicated with Dr. Holley Raring on 09/09/2019.  Patient has blood work done on 09/10/2019 which showed platelet count of 60,000. Today patient presents for follow-up.  He denies any breathing difficulties.  He reports feeling tired. He was accompanied by his daughter who reports that patient's lower extremity are swelling. No bleeding events. He says he finishes Eliquis  24m BID and decreased to Elqiuis 522mBID. With further questioning, he admits to only taking Eliquis 66m93mnce daily as he is concerned about low platelets.    Review of systems- Review of Systems  Constitutional: Positive for fatigue. Negative for appetite change, chills, fever and unexpected weight change.  HENT:   Negative for hearing loss and voice change.   Eyes: Negative for eye problems and icterus.  Respiratory: Negative for chest tightness, cough and shortness of breath.   Cardiovascular: Negative for chest pain and leg swelling.  Gastrointestinal: Negative for abdominal distention and abdominal pain.  Endocrine: Negative for hot flashes.  Genitourinary: Negative for difficulty urinating, dysuria and frequency.   Musculoskeletal: Negative for arthralgias.  Skin: Negative for itching and rash.  Neurological: Negative for light-headedness and numbness.  Hematological: Negative for adenopathy. Does not bruise/bleed easily.  Psychiatric/Behavioral: Negative for confusion.    Allergies  Allergen Reactions  . 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 Amoxicillin-Pot Clavulanate Rash    Patient Active Problem List   Diagnosis Date Noted  . ESRD (end stage renal disease) on dialysis ([facility]4/12/2019  . Cough   . HIT (heparin-induced thrombocytopenia) ([facility] . COVID-19 08/24/2019  . Elevated troponin   . Thrombocytopenia ([place] . Tachycardia 08/23/2019  . ESRD (end stage renal disease) ([place]/12/2019  . Anemia of chronic kidney failure, stage 5 ([facility]CC[place]3/12/2019  . Acute flank pain 09/21/2017  . 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 Chronic conjunctivitis 08/20/2015  . Benign prostatic hyperplasia with lower urinary tract symptoms 02/19/2015  . Vitamin D deficiency 01/17/2014  . Diabetes  mellitus with renal manifestation ([place]) 12/10/2012  . Obesity 12/10/2012  . OSA on CPAP 12/10/2012  . Stage 5 chronic kidney disease not on chronic dialysis ([place]) 12/10/2012  . Essential hypertension 12/07/2012     Past Medical History:  Diagnosis Date  . Chronic kidney disease   . Hyperlipidemia   . Hypertension   . Sleep apnea      Past Surgical History:  Procedure Laterality Date  . AV FISTULA PLACEMENT Left 08/10/2019   Procedure: Left Upper Extremity AV Fistula creation;  Surgeon: Algernon Huxley, MD;  Location: [facility] ORS;  Service: Vascular;  Laterality: Left;  . DIALYSIS/PERMA CATHETER INSERTION N/A 08/08/2019   Procedure: DIALYSIS/PERMA CATHETER INSERTION;  Surgeon: Algernon Huxley, MD;  Location: [place] [facility];  Service: Cardiovascular;  Laterality: N/A;    Social History   Socioeconomic History  . Marital status: Married    Spouse name: Peter Pearson  . Number of children: 3  . Years of education: Not on file  . Highest education level: Not on file  Occupational History  . Occupation: [employer]   Tobacco Use  . Smoking status: Former Smoker    Packs/day: 0.00    Years: 5.00    Pack years: 0.00    Quit date: 08/06/1989    Years since quitting: 30.1  . Smokeless tobacco: Never Used  Substance and Sexual Activity  . Alcohol use: Never  . Drug use: Never  . Sexual activity: Not on file  Other Topics Concern  . Not on file  Social History Narrative  . Not on file   Social Determinants of Health   Financial Resource Strain:   . Difficulty of Paying Living Expenses:   Food Insecurity:   . Worried About Charity fundraiser in the Last Year:   . Arboriculturist in the Last Year:   Transportation Needs:   . Film/video editor (Medical):   Marland Kitchen Lack of Transportation (Non-Medical):   Physical Activity:   . Days of Exercise per Week:   . Minutes of Exercise per Session:   Stress:   . Feeling of Stress :   Social Connections:   . Frequency of  Communication with Friends and Family:   . Frequency of Social Gatherings with Friends and Family:   . Attends Religious Services:   . Active Member of Clubs or Organizations:   . Attends Archivist Meetings:   Marland Kitchen Marital Status:   Intimate Partner Violence:   . Fear of Current or Ex-Partner:   . Emotionally Abused:   Marland Kitchen Physically Abused:   . Sexually Abused:      Family History  Problem Relation Age of Onset  . Heart disease Father      Current Outpatient Medications:  .  allopurinol (ZYLOPRIM) 100 MG tablet, Take 100 mg by mouth daily., Disp: , Rfl:  .  apixaban (ELIQUIS) 5 MG TABS tablet, Take 2 tablets (10 mg total) by mouth 2 (two) times daily. For 1 week and then take 1 tablet / 5 mg twice daily for 3 more weeks. (Patient taking differently: Take 10 mg by mouth daily. For 1 week and then take 1 tablet / 5 mg twice daily for 3 more weeks.), Disp: 60 tablet, Rfl: 0 .  ascorbic  acid (VITAMIN C) 500 MG tablet, Take 1 tablet (500 mg total) by mouth daily., Disp: 90 tablet, Rfl: 0 .  chlorpheniramine-HYDROcodone (TUSSIONEX) 10-8 MG/5ML SUER, Take 5 mLs by mouth every 12 (twelve) hours as needed for cough., Disp: 140 mL, Rfl: 0 .  cholecalciferol (VITAMIN D) 25 MCG tablet, Take 1 tablet (1,000 Units total) by mouth daily., Disp: 90 tablet, Rfl: 0 .  diltiazem (CARDIZEM CD) 240 MG 24 hr capsule, Take 240 mg by mouth daily., Disp: , Rfl:  .  linagliptin (TRADJENTA) 5 MG TABS tablet, Take 1 tablet (5 mg total) by mouth daily., Disp: 30 tablet, Rfl: 1 .  midodrine (PROAMATINE) 5 MG tablet, Take 5 mg of midodrine on dialysis days (Tuesday, Thursday, and Saturday), Disp: 20 tablet, Rfl: 0 .  pyridOXINE (VITAMIN B-6) 50 MG tablet, Take 50-100 mg by mouth daily. , Disp: , Rfl:  .  rosuvastatin (CRESTOR) 20 MG tablet, Take 10 mg by mouth daily., Disp: , Rfl:  .  tamsulosin (FLOMAX) 0.4 MG CAPS capsule, Take 0.4 mg by mouth daily., Disp: , Rfl:  .  zinc sulfate 220 (50 Zn) MG capsule,  Take 1 capsule (220 mg total) by mouth daily., Disp: 90 capsule, Rfl: 0 .  azithromycin (ZITHROMAX) 250 MG tablet, Take 1 pill of 250 mg daily for next 3 days. (Patient not taking: Reported on 09/09/2019), Disp: 3 each, Rfl: 0 .  insulin detemir (LEVEMIR) 100 UNIT/ML injection, Inject 0.09 mLs (9 Units total) into the skin 2 (two) times daily. (Patient not taking: Reported on 09/09/2019), Disp: 10 mL, Rfl: 11   Physical exam:  Vitals:   09/12/19 0920  BP: (!) 111/58  Pulse: 82  Resp: 18  Temp: 99.1 F (37.3 C)  Weight: 271 lb 1.6 oz (123 kg)  Height: 5' 8.9" (1.75 m)   Physical Exam  Constitutional: He is oriented to person, place, and time. No distress.  Obese.  Eyes: No scleral icterus.  Cardiovascular:  No murmur heard. Pulmonary/Chest: Effort normal and breath sounds normal. No respiratory distress.  Abdominal: Soft. Bowel sounds are normal.  Musculoskeletal:        General: Edema present. Normal range of motion.     Cervical back: Normal range of motion and neck supple.  Neurological: He is alert and oriented to person, place, and time.  Skin: Skin is warm and dry.  Psychiatric: Affect normal.       CMP Latest Ref Rng & Units 08/27/2019  Glucose 70 - 99 mg/dL 177(H)  BUN 8 - 23 mg/dL 100(H)  Creatinine 0.61 - 1.24 mg/dL 6.54(H)  Sodium 135 - 145 mmol/L 135  Potassium 3.5 - 5.1 mmol/L 4.1  Chloride 98 - 111 mmol/L 98  CO2 22 - 32 mmol/L 19(L)  Calcium 8.9 - 10.3 mg/dL 7.9(L)  Total Protein 6.5 - 8.1 g/dL 6.2(L)  Total Bilirubin 0.3 - 1.2 mg/dL 0.4  Alkaline Phos 38 - 126 U/L 58  AST 15 - 41 U/L 22  ALT 0 - 44 U/L 29   CBC Latest Ref Rng & Units 09/12/2019  WBC 4.0 - 10.5 K/uL 5.5  Hemoglobin 13.0 - 17.0 g/dL 7.6(L)  Hematocrit 39.0 - 52.0 % 24.3(L)  Platelets 150 - 400 K/uL 67(L)    RADIOGRAPHIC STUDIES: I have personally reviewed the radiological images as listed and agreed with the findings in the report. DG Chest 2 View  Result Date: 08/26/2019 CLINICAL  DATA:  History of COVID-19 positivity with persistent cough EXAM: CHEST - 2 VIEW  COMPARISON:  08/23/2019 FINDINGS: Cardiac shadow is stable. Dialysis catheter is again seen. Patchy increased opacities are noted bilaterally consistent with the given clinical history of COVID-19 positivity. No sizable effusion is noted. No bony abnormality is seen. IMPRESSION: Patchy opacities consistent with the given clinical history of COVID-19 positivity Electronically Signed   By: Inez Catalina M.D.   On: 08/26/2019 09:20   DG Chest 2 View  Result Date: 08/23/2019 CLINICAL DATA:  Shortness of breath EXAM: CHEST - 2 VIEW COMPARISON:  08/09/2019 FINDINGS: Right tunneled dialysis catheter is unchanged. No pleural effusion or pneumothorax. Stable cardiomediastinal contours. No new consolidation or edema. No acute osseous abnormality. IMPRESSION: No acute process in the chest. Electronically Signed   By: Macy Mis M.D.   On: 08/23/2019 14:18   NM Pulmonary Perfusion  Result Date: 08/24/2019 CLINICAL DATA:  Hypoxia EXAM: NUCLEAR MEDICINE PERFUSION LUNG SCAN TECHNIQUE: Perfusion images were obtained in multiple projections after intravenous injection of radiopharmaceutical. Ventilation scans intentionally deferred if perfusion scan and chest x-ray adequate for interpretation during COVID 19 epidemic. Views: Anterior, posterior, left lateral, right lateral, RPO, LPO, RAO, LAO RADIOPHARMACEUTICALS:  3.84 mCi Tc-36mMAA IV COMPARISON:  Chest radiograph August 23, 2019 FINDINGS: Radiotracer uptake is homogeneous and symmetric bilaterally. No evident perfusion defects. IMPRESSION: No perfusion defects.  Very low probability of pulmonary embolus. Electronically Signed   By: WLowella GripIII M.D.   On: 08/24/2019 13:56   ECHOCARDIOGRAM COMPLETE  Result Date: 08/24/2019    ECHOCARDIOGRAM REPORT   Patient Name:   AAUDIEL SCHEIBERDate of Exam: 08/24/2019 Medical Rec #:  [id number]     Height:       68.0 in Accession #:     [id number]    Weight:       276.0 lb Date of Birth:  207-11-1955     BSA:          2.344 m Patient Age:    89years       BP:           114/56 mmHg Patient Gender: M              HR:           79 bpm. Exam Location:  [facility] Procedure: Color Doppler and Cardiac Doppler Indications:     Elevated troponin  History:         Patient has no prior history of Echocardiogram examinations.                  Risk Factors:Hypertension and Sleep Apnea.  Sonographer:     JSherrie SportRDCS (AE) Referring Phys:  [id number] MerlesMANSY Diagnosing Phys: BKate SableMD  Sonographer Comments: No apical window. IMPRESSIONS  1. Left ventricular ejection fraction, by estimation, is 60 to 65%. The left ventricle has normal function. The left ventricle has no regional wall motion abnormalities. There is mild left ventricular hypertrophy. Left ventricular diastolic function could not be evaluated.  2. Right ventricular systolic function is normal. The right ventricular size is normal.  3. The mitral valve is grossly normal. No evidence of mitral valve regurgitation.  4. The aortic valve is tricuspid. Aortic valve regurgitation is not visualized. Mild to moderate aortic valve sclerosis/calcification is present, without any evidence of aortic stenosis.  5. Aortic dilatation noted. There is mild dilatation of the aortic root measuring 42 mm. FINDINGS  Left Ventricle: Left ventricular ejection fraction, by estimation, is 60 to 65%. The left  ventricle has normal function. The left ventricle has no regional wall motion abnormalities. The left ventricular internal cavity size was normal in size. There is  mild left ventricular hypertrophy. Left ventricular diastolic function could not be evaluated. Right Ventricle: The right ventricular size is normal. No increase in right ventricular wall thickness. Right ventricular systolic function is normal. Left Atrium: Left atrial size was normal in size. Right Atrium: Right atrial size was not well  visualized. Pericardium: There is no evidence of pericardial effusion. Mitral Valve: The mitral valve is grossly normal. No evidence of mitral valve regurgitation. Tricuspid Valve: The tricuspid valve is grossly normal. Tricuspid valve regurgitation is not demonstrated. Aortic Valve: The aortic valve is tricuspid. Aortic valve regurgitation is not visualized. Mild to moderate aortic valve sclerosis/calcification is present, without any evidence of aortic stenosis. Pulmonic Valve: The pulmonic valve was not well visualized. Pulmonic valve regurgitation is trivial. Aorta: Aortic dilatation noted. There is mild dilatation of the aortic root measuring 42 mm. Venous: The inferior vena cava was not well visualized. IAS/Shunts: The interatrial septum was not well visualized.  LEFT VENTRICLE PLAX 2D LVIDd:         5.14 cm LVIDs:         2.65 cm LV PW:         1.39 cm LV IVS:        1.30 cm LVOT diam:     2.20 cm LVOT Area:     3.80 cm  LEFT ATRIUM         Index LA diam:    3.40 cm 1.45 cm/m                        PULMONIC VALVE AORTA                 PV Vmax:        0.89 m/s Ao Root diam: 4.10 cm PV Peak grad:   3.2 mmHg                       RVOT Peak grad: 14 mmHg  TRICUSPID VALVE TR Peak grad:   21.7 mmHg TR Vmax:        233.00 cm/s  SHUNTS Systemic Diam: 2.20 cm Kate Sable MD Electronically signed by Kate Sable MD Signature Date/Time: 08/24/2019/11:50:30 AM    Final     Assessment and plan-  Patient is a 64 y.o. male with history of OSA, ESRD on HD, recently diagnosis of HIT presents for follow up.  1. History of heparin-induced thrombocytopenia   2. Anemia of chronic kidney failure, stage 5 (HCC)    # Thrombocytopenia, with positive HIT antibody 2.625 OD, and SRA 99%, supporting the diagnosis of HIT.  He was asymptomatic during his hospitalization and was started on Eliquis for anticoagulation.  He self decreased his Eliquis to 48m BID.  I had a lengthy discussion with patient, working  diagnosis is HIT, his platelet has not recovered to his baseline, raising concerns of having autoimmune HIT, vs continued heparin exposure, vs other etiologies, ie ITP, DIC, bone marrow suppression due to recent infection.  Repeat CBC showed stable thrombocytopenia with platelet counts at 67,000, I recommend patient to resume to Eliquis 52mBID and also I will check fibrinogen, D dimer, check bilateral [place] lower extremity. Rational of anticoagulation in HIT and bleeding risk was discussed with him.   I discussed with him about IVIG in the future, if  he has persistent HIT, may consider IVIG. For now he is asymptomatic and platelet is stable comparing to the level at discharge, continue Elqiuis. Will see if any blood clots on [place].    # Anemia of CKD, hemoglobin is 7.6, he is fatigue.  He follows up with Dr.Lateef for management.   Check multiple myeloma work up.    Earlie Server, MD, PhD Hematology Oncology [facility] Pager- [phone] 09/12/2019

## 2019-09-12 NOTE — Telephone Encounter (Signed)
Unsuccessful call made to daughter (contact in chart) and pt phone number.

## 2019-09-12 NOTE — Telephone Encounter (Signed)
[place] called report of NEGATIVE for DVT and said daughter asks that you call her please

## 2019-09-20 ENCOUNTER — Other Ambulatory Visit: Payer: Self-pay

## 2019-09-20 ENCOUNTER — Encounter: Payer: Self-pay | Admitting: Oncology

## 2019-09-20 ENCOUNTER — Inpatient Hospital Stay: Payer: PRIVATE HEALTH INSURANCE

## 2019-09-20 ENCOUNTER — Inpatient Hospital Stay (HOSPITAL_BASED_OUTPATIENT_CLINIC_OR_DEPARTMENT_OTHER): Payer: PRIVATE HEALTH INSURANCE | Admitting: Oncology

## 2019-09-20 VITALS — BP 146/73 | HR 91 | Temp 97.7°F | Resp 18 | Wt 267.9 lb

## 2019-09-20 DIAGNOSIS — D631 Anemia in chronic kidney disease: Secondary | ICD-10-CM

## 2019-09-20 DIAGNOSIS — D7582 Heparin induced thrombocytopenia (HIT): Secondary | ICD-10-CM

## 2019-09-20 DIAGNOSIS — N185 Chronic kidney disease, stage 5: Secondary | ICD-10-CM

## 2019-09-20 DIAGNOSIS — N186 End stage renal disease: Secondary | ICD-10-CM | POA: Diagnosis not present

## 2019-09-20 DIAGNOSIS — Z862 Personal history of diseases of the blood and blood-forming organs and certain disorders involving the immune mechanism: Secondary | ICD-10-CM

## 2019-09-20 DIAGNOSIS — D75829 Heparin-induced thrombocytopenia, unspecified: Secondary | ICD-10-CM

## 2019-09-20 DIAGNOSIS — Z992 Dependence on renal dialysis: Secondary | ICD-10-CM | POA: Diagnosis not present

## 2019-09-20 DIAGNOSIS — Z7901 Long term (current) use of anticoagulants: Secondary | ICD-10-CM | POA: Diagnosis not present

## 2019-09-20 DIAGNOSIS — I120 Hypertensive chronic kidney disease with stage 5 chronic kidney disease or end stage renal disease: Secondary | ICD-10-CM | POA: Diagnosis not present

## 2019-09-20 DIAGNOSIS — Z79899 Other long term (current) drug therapy: Secondary | ICD-10-CM | POA: Diagnosis not present

## 2019-09-20 LAB — CBC WITH DIFFERENTIAL/PLATELET
Abs Immature Granulocytes: 0.07 10*3/uL (ref 0.00–0.07)
Basophils Absolute: 0 10*3/uL (ref 0.0–0.1)
Basophils Relative: 0 %
Eosinophils Absolute: 0.5 10*3/uL (ref 0.0–0.5)
Eosinophils Relative: 9 %
HCT: 27.1 % — ABNORMAL LOW (ref 39.0–52.0)
Hemoglobin: 8.6 g/dL — ABNORMAL LOW (ref 13.0–17.0)
Immature Granulocytes: 1 %
Lymphocytes Relative: 32 %
Lymphs Abs: 1.8 10*3/uL (ref 0.7–4.0)
MCH: 28.4 pg (ref 26.0–34.0)
MCHC: 31.7 g/dL (ref 30.0–36.0)
MCV: 89.4 fL (ref 80.0–100.0)
Monocytes Absolute: 0.4 10*3/uL (ref 0.1–1.0)
Monocytes Relative: 7 %
Neutro Abs: 2.8 10*3/uL (ref 1.7–7.7)
Neutrophils Relative %: 51 %
Platelets: 187 10*3/uL (ref 150–400)
RBC: 3.03 MIL/uL — ABNORMAL LOW (ref 4.22–5.81)
RDW: 14.4 % (ref 11.5–15.5)
WBC: 5.5 10*3/uL (ref 4.0–10.5)
nRBC: 0 % (ref 0.0–0.2)

## 2019-09-20 LAB — RETIC PANEL
Immature Retic Fract: 25 % — ABNORMAL HIGH (ref 2.3–15.9)
RBC.: 3.08 MIL/uL — ABNORMAL LOW (ref 4.22–5.81)
Retic Count, Absolute: 107.5 10*3/uL (ref 19.0–186.0)
Retic Ct Pct: 3.5 % — ABNORMAL HIGH (ref 0.4–3.1)
Reticulocyte Hemoglobin: 27.9 pg — ABNORMAL LOW (ref >27.9)

## 2019-09-20 MED ORDER — APIXABAN 5 MG PO TABS: 5.0000 mg | ORAL_TABLET | Freq: Two times a day (BID) | ORAL | 0 refills | Status: DC

## 2019-09-20 NOTE — Progress Notes (Signed)
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 Patient was admitted from 08/23/2019-08/27/2019 due to COVID-19 pneumonia, respiratory failure with  hypoxia.  Patient was found to have acute thrombocytopenia with a count of 19,000.  Work-up for HIT was sent.  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 Patient was started on Eliquis 10 mg twice daily, I recommended him to continue Eliquis 10 mg twice daily for 7 days or until platelet recovers to be above 1 50,000, whichever is longer, then switch to Eliquis 5 mg twice daily. Due to recent COVID-19 infection, he is not able to come to cancer center to have blood draws for 2 weeks.  I discussed with patient's nephrologist Dr. Holley Raring and hospitalist and I recommend patient to have CBC done when he gets dialysis, approximately 3 days and 7 days  after discharge and have the results sent to my office. Patient had blood work done on 08/30/2019.  I did not get results faxed over to me and to 1 week after that.  Results reviewed, platelet count continues to be low at 40,000.  I communicated with Dr. Holley Raring on 09/09/2019.  Patient has blood work done on 09/10/2019 which showed platelet count of 60,000.  09/12/2019 He says he finishes Eliquis 40m BID and decreased to Elqiuis 558mBID. With further questioning, he admits to only taking Eliquis 75m77mnce daily as he is concerned about low platelets.   [name] a 64 34o.  male who has above history reviewed by me today presents for follow up visit for management of thrombocytopenia Problems and complaints are listed below: Patient was accompanied by wife to clinic. He takes Eliquis 75mg34mD.  He denies any bleeding events. No new complaints. Denies any SOB or breath difficulty.   Review of systems- Review of Systems  Constitutional: Positive for fatigue. Negative for appetite change, chills, fever and unexpected weight change.  HENT:   Positive for hearing loss. Negative for voice change.   Eyes: Negative for eye problems and icterus.  Respiratory: Negative for chest tightness, cough and shortness of breath.   Cardiovascular: Negative for chest pain and leg swelling.  Gastrointestinal: Negative for abdominal distention and abdominal pain.  Endocrine: Negative for hot flashes.  Genitourinary: Negative for difficulty urinating, dysuria and frequency.   Musculoskeletal: Negative for arthralgias.  Skin: Negative for itching and rash.  Neurological: Negative for light-headedness and numbness.  Hematological: Negative for adenopathy. Does not bruise/bleed easily.  Psychiatric/Behavioral: Negative for confusion.    Allergies  Allergen Reactions  . 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 Amoxicillin-Pot Clavulanate Rash    Patient Active Problem List   Diagnosis Date Noted  . ESRD (end stage renal disease) on dialysis (HCC)[place]/12/2019  . Cough   . HIT (heparin-induced thrombocytopenia) (HCC)[place]. COVID-19 08/24/2019  . Elevated troponin   . Thrombocytopenia (HCC)[place]. Tachycardia 08/23/2019  . ESRD (end  stage renal disease) ([place]) 08/07/2019  . Anemia of chronic kidney failure, stage 5 ([place]) 08/07/2019  . Acute flank pain 09/21/2017  . Acute gout due to renal impairment involving left foot 08/14/2017  . Alport's syndrome 05/01/2016    . Chronic conjunctivitis 08/20/2015  . Benign prostatic hyperplasia with lower urinary tract symptoms 02/19/2015  . Vitamin D deficiency 01/17/2014  . Diabetes mellitus with renal manifestation ([place]) 12/10/2012  . Obesity 12/10/2012  . OSA on CPAP 12/10/2012  . Stage 5 chronic kidney disease not on chronic dialysis ([place]) 12/10/2012  . Essential hypertension 12/07/2012     Past Medical History:  Diagnosis Date  . Chronic kidney disease   . Hyperlipidemia   . Hypertension   . Sleep apnea      Past Surgical History:  Procedure Laterality Date  . AV FISTULA PLACEMENT Left 08/10/2019   Procedure: Left Upper Extremity AV Fistula creation;  Surgeon: Algernon Huxley, MD;  Location: [facility] ORS;  Service: Vascular;  Laterality: Left;  . DIALYSIS/PERMA CATHETER INSERTION N/A 08/08/2019   Procedure: DIALYSIS/PERMA CATHETER INSERTION;  Surgeon: Algernon Huxley, MD;  Location: [place] [facility];  Service: Cardiovascular;  Laterality: N/A;    Social History   Socioeconomic History  . Marital status: Married    Spouse name: Shamim  . Number of children: 3  . Years of education: Not on file  . Highest education level: Not on file  Occupational History  . Occupation: [name]   Tobacco Use  . Smoking status: Former Smoker    Packs/day: 0.00    Years: 5.00    Pack years: 0.00    Quit date: 08/06/1989    Years since quitting: 30.1  . Smokeless tobacco: Never Used  Substance and Sexual Activity  . Alcohol use: Never  . Drug use: Never  . Sexual activity: Not on file  Other Topics Concern  . Not on file  Social History Narrative  . Not on file   Social Determinants of Health   Financial Resource Strain:   . Difficulty of Paying Living Expenses:   Food Insecurity:   . Worried About Charity fundraiser in the Last Year:   . Arboriculturist in the Last Year:   Transportation Needs:   . Film/video editor (Medical):   Marland Kitchen Lack of Transportation (Non-Medical):   Physical  Activity:   . Days of Exercise per Week:   . Minutes of Exercise per Session:   Stress:   . Feeling of Stress :   Social Connections:   . Frequency of Communication with Friends and Family:   . Frequency of Social Gatherings with Friends and Family:   . Attends Religious Services:   . Active Member of Clubs or Organizations:   . Attends Archivist Meetings:   Marland Kitchen Marital Status:   Intimate Partner Violence:   . Fear of Current or Ex-Partner:   . Emotionally Abused:   Marland Kitchen Physically Abused:   . Sexually Abused:      Family History  Problem Relation Age of Onset  . Heart disease Father      Current Outpatient Medications:  .  allopurinol (ZYLOPRIM) 100 MG tablet, Take 100 mg by mouth daily., Disp: , Rfl:  .  apixaban (ELIQUIS) 5 MG TABS tablet, Take 1 tablet (5 mg total) by mouth 2 (two) times daily. For 1 week and then take 1 tablet / 5 mg twice daily for 3 more weeks., Disp: 60 tablet,  Rfl: 0 .  ascorbic acid (VITAMIN C) 500 MG tablet, Take 1 tablet (500 mg total) by mouth daily., Disp: 90 tablet, Rfl: 0 .  cholecalciferol (VITAMIN D) 25 MCG tablet, Take 1 tablet (1,000 Units total) by mouth daily., Disp: 90 tablet, Rfl: 0 .  diltiazem (CARDIZEM CD) 240 MG 24 hr capsule, Take 240 mg by mouth daily., Disp: , Rfl:  .  linagliptin (TRADJENTA) 5 MG TABS tablet, Take 1 tablet (5 mg total) by mouth daily., Disp: 30 tablet, Rfl: 1 .  midodrine (PROAMATINE) 5 MG tablet, Take 5 mg of midodrine on dialysis days (Tuesday, Thursday, and Saturday), Disp: 20 tablet, Rfl: 0 .  pyridOXINE (VITAMIN B-6) 50 MG tablet, Take 50-100 mg by mouth daily. , Disp: , Rfl:  .  rosuvastatin (CRESTOR) 20 MG tablet, Take 10 mg by mouth daily., Disp: , Rfl:  .  tamsulosin (FLOMAX) 0.4 MG CAPS capsule, Take 0.4 mg by mouth daily., Disp: , Rfl:  .  zinc sulfate 220 (50 Zn) MG capsule, Take 1 capsule (220 mg total) by mouth daily., Disp: 90 capsule, Rfl: 0 .  azithromycin (ZITHROMAX) 250 MG tablet, Take 1  pill of 250 mg daily for next 3 days. (Patient not taking: Reported on 09/09/2019), Disp: 3 each, Rfl: 0 .  chlorpheniramine-HYDROcodone (TUSSIONEX) 10-8 MG/5ML SUER, Take 5 mLs by mouth every 12 (twelve) hours as needed for cough. (Patient not taking: Reported on 09/20/2019), Disp: 140 mL, Rfl: 0 .  insulin detemir (LEVEMIR) 100 UNIT/ML injection, Inject 0.09 mLs (9 Units total) into the skin 2 (two) times daily. (Patient not taking: Reported on 09/09/2019), Disp: 10 mL, Rfl: 11   Physical exam:  Vitals:   09/20/19 0959  BP: (!) 146/73  Pulse: 91  Resp: 18  Temp: 97.7 F (36.5 C)  Weight: 267 lb 14.4 oz (121.5 kg)   Physical Exam  Constitutional: He is oriented to person, place, and time. No distress.  Obese.  HENT:  Head: Normocephalic and atraumatic.  Nose: Nose normal.  Mouth/Throat: Oropharynx is clear and moist. No oropharyngeal exudate.  Eyes: Pupils are equal, round, and reactive to light. EOM are normal. No scleral icterus.  Cardiovascular: Normal rate and regular rhythm.  No murmur heard. Pulmonary/Chest: Effort normal and breath sounds normal. No respiratory distress. He has no rales. He exhibits no tenderness.  Abdominal: Soft. Bowel sounds are normal. He exhibits no distension. There is no abdominal tenderness.  Musculoskeletal:        General: Edema present. Normal range of motion.     Cervical back: Normal range of motion and neck supple.  Neurological: He is alert and oriented to person, place, and time. No cranial nerve deficit. He exhibits normal muscle tone. Coordination normal.  Skin: Skin is warm and dry. He is not diaphoretic. No erythema.  Psychiatric: Affect normal.       CMP Latest Ref Rng & Units 08/27/2019  Glucose 70 - 99 mg/dL 177(H)  BUN 8 - 23 mg/dL 100(H)  Creatinine 0.61 - 1.24 mg/dL 6.54(H)  Sodium 135 - 145 mmol/L 135  Potassium 3.5 - 5.1 mmol/L 4.1  Chloride 98 - 111 mmol/L 98  CO2 22 - 32 mmol/L 19(L)  Calcium 8.9 - 10.3 mg/dL 7.9(L)  Total  Protein 6.5 - 8.1 g/dL 6.2(L)  Total Bilirubin 0.3 - 1.2 mg/dL 0.4  Alkaline Phos 38 - 126 U/L 58  AST 15 - 41 U/L 22  ALT 0 - 44 U/L 29   CBC Latest Ref Rng &  Units 09/20/2019  WBC 4.0 - 10.5 K/uL 5.5  Hemoglobin 13.0 - 17.0 g/dL 8.6(L)  Hematocrit 39.0 - 52.0 % 27.1(L)  Platelets 150 - 400 K/uL 187    RADIOGRAPHIC STUDIES: I have personally reviewed the radiological images as listed and agreed with the findings in the report. DG Chest 2 View  Result Date: 08/26/2019 CLINICAL DATA:  History of COVID-19 positivity with persistent cough EXAM: CHEST - 2 VIEW COMPARISON:  08/23/2019 FINDINGS: Cardiac shadow is stable. Dialysis catheter is again seen. Patchy increased opacities are noted bilaterally consistent with the given clinical history of COVID-19 positivity. No sizable effusion is noted. No bony abnormality is seen. IMPRESSION: Patchy opacities consistent with the given clinical history of COVID-19 positivity Electronically Signed   By: Inez Catalina M.D.   On: 08/26/2019 09:20   DG Chest 2 View  Result Date: 08/23/2019 CLINICAL DATA:  Shortness of breath EXAM: CHEST - 2 VIEW COMPARISON:  08/09/2019 FINDINGS: Right tunneled dialysis catheter is unchanged. No pleural effusion or pneumothorax. Stable cardiomediastinal contours. No new consolidation or edema. No acute osseous abnormality. IMPRESSION: No acute process in the chest. Electronically Signed   By: Macy Mis M.D.   On: 08/23/2019 14:18   NM Pulmonary Perfusion  Result Date: 08/24/2019 CLINICAL DATA:  Hypoxia EXAM: NUCLEAR MEDICINE PERFUSION LUNG SCAN TECHNIQUE: Perfusion images were obtained in multiple projections after intravenous injection of radiopharmaceutical. Ventilation scans intentionally deferred if perfusion scan and chest x-ray adequate for interpretation during COVID 19 epidemic. Views: Anterior, posterior, left lateral, right lateral, RPO, LPO, RAO, LAO RADIOPHARMACEUTICALS:  3.84 mCi Tc-7mMAA IV COMPARISON:   Chest radiograph August 23, 2019 FINDINGS: Radiotracer uptake is homogeneous and symmetric bilaterally. No evident perfusion defects. IMPRESSION: No perfusion defects.  Very low probability of pulmonary embolus. Electronically Signed   By: WLowella GripIII M.D.   On: 08/24/2019 13:56   UKoreaVenous Img Lower Bilateral  Result Date: 09/12/2019 CLINICAL DATA:  HIT EXAM: BILATERAL LOWER EXTREMITY VENOUS DOPPLER ULTRASOUND TECHNIQUE: Gray-scale sonography with compression, as well as color and duplex ultrasound, were performed to evaluate the deep venous system(s) from the level of the common femoral vein through the popliteal and proximal calf veins. COMPARISON:  None. FINDINGS: VENOUS Normal compressibility of the common femoral, superficial femoral, and popliteal veins, as well as the visualized calf veins. Visualized portions of profunda femoral vein and great saphenous vein unremarkable. No filling defects to suggest DVT on grayscale or color Doppler imaging. Doppler waveforms show normal direction of venous flow, normal respiratory phasicity and response to augmentation. Limited views of the contralateral common femoral vein are unremarkable. OTHER None. Limitations: none IMPRESSION: No femoropopliteal DVT nor evidence of DVT within the visualized calf veins bilaterally. Electronically Signed   By: PMacy MisM.D.   On: 09/12/2019 15:05   ECHOCARDIOGRAM COMPLETE  Result Date: 08/24/2019    ECHOCARDIOGRAM REPORT   Patient Name:   ADEVANSH RIESEDate of Exam: 08/24/2019 Medical Rec #:  [id number]     Height:       68.0 in Accession #:    [id number]    Weight:       276.0 lb Date of Birth:  2July 20, 1957     BSA:          2.344 m Patient Age:    89years       BP:           114/56 mmHg Patient Gender: M  HR:           79 bpm. Exam Location:  [facility] Procedure: Color Doppler and Cardiac Doppler Indications:     Elevated troponin  History:         Patient has no prior history of Echocardiogram  examinations.                  Risk Factors:Hypertension and Sleep Apnea.  Sonographer:     Sherrie Sport RDCS (AE) Referring Phys:  [id number] Arvella Merles MANSY Diagnosing Phys: Kate Sable MD  Sonographer Comments: No apical window. IMPRESSIONS  1. Left ventricular ejection fraction, by estimation, is 60 to 65%. The left ventricle has normal function. The left ventricle has no regional wall motion abnormalities. There is mild left ventricular hypertrophy. Left ventricular diastolic function could not be evaluated.  2. Right ventricular systolic function is normal. The right ventricular size is normal.  3. The mitral valve is grossly normal. No evidence of mitral valve regurgitation.  4. The aortic valve is tricuspid. Aortic valve regurgitation is not visualized. Mild to moderate aortic valve sclerosis/calcification is present, without any evidence of aortic stenosis.  5. Aortic dilatation noted. There is mild dilatation of the aortic root measuring 42 mm. FINDINGS  Left Ventricle: Left ventricular ejection fraction, by estimation, is 60 to 65%. The left ventricle has normal function. The left ventricle has no regional wall motion abnormalities. The left ventricular internal cavity size was normal in size. There is  mild left ventricular hypertrophy. Left ventricular diastolic function could not be evaluated. Right Ventricle: The right ventricular size is normal. No increase in right ventricular wall thickness. Right ventricular systolic function is normal. Left Atrium: Left atrial size was normal in size. Right Atrium: Right atrial size was not well visualized. Pericardium: There is no evidence of pericardial effusion. Mitral Valve: The mitral valve is grossly normal. No evidence of mitral valve regurgitation. Tricuspid Valve: The tricuspid valve is grossly normal. Tricuspid valve regurgitation is not demonstrated. Aortic Valve: The aortic valve is tricuspid. Aortic valve regurgitation is not visualized. Mild to  moderate aortic valve sclerosis/calcification is present, without any evidence of aortic stenosis. Pulmonic Valve: The pulmonic valve was not well visualized. Pulmonic valve regurgitation is trivial. Aorta: Aortic dilatation noted. There is mild dilatation of the aortic root measuring 42 mm. Venous: The inferior vena cava was not well visualized. IAS/Shunts: The interatrial septum was not well visualized.  LEFT VENTRICLE PLAX 2D LVIDd:         5.14 cm LVIDs:         2.65 cm LV PW:         1.39 cm LV IVS:        1.30 cm LVOT diam:     2.20 cm LVOT Area:     3.80 cm  LEFT ATRIUM         Index LA diam:    3.40 cm 1.45 cm/m                        PULMONIC VALVE AORTA                 PV Vmax:        0.89 m/s Ao Root diam: 4.10 cm PV Peak grad:   3.2 mmHg                       RVOT Peak grad: 14 mmHg  TRICUSPID VALVE TR Peak grad:  21.7 mmHg TR Vmax:        233.00 cm/s  SHUNTS Systemic Diam: 2.20 cm Kate Sable MD Electronically signed by Kate Sable MD Signature Date/Time: 08/24/2019/11:50:30 AM    Final     Assessment and plan-  Patient is a 64 y.o. male with history of OSA, ESRD on HD, recently diagnosis of HIT presents for follow up.  1. HIT (heparin-induced thrombocytopenia) (HCC)   2. Anemia of chronic kidney failure, stage 5 (HCC)    # Thrombocytopenia, with positive HIT antibody 2.625 OD, and SRA 99%, supporting the diagnosis of HIT.  He was asymptomatic, lower extremity US showed DVT.  He tolerates anticoagulation with Eliquis 4m BID. Labs are reviewed and discussed with patient. Platelet counts have recovered.  I recommend him to continue Eliquis 516mBID for another 4 weeks.  May consider stopping Eliquis at that time.   Patient reports that he is going to have peritoneal catheter placement next week and his anticoagulation needs to be interrupted. I called and discussed with Dr.Lateef and recommend not to interrupt his anticoagulation for 4 weeks.  Dr.Lateef will discuss with  surgery to see if catheter can be placed without interrupting his anticoagulation, or procedure to be delayed after 4 weeks.   # Anemia of CKD, hemoglobin is 8.6, continue follow up with Dr.Lateef for management.  Check multiple myeloma work up, result is pending.   ZhEarlie ServerMD, PhD Hematology Oncology CoFayette Regional Health Systemt AlRoane General Hospitalager- [phone]/20/2021

## 2019-09-20 NOTE — Progress Notes (Signed)
Patient does not offer any problems today.  

## 2019-09-21 LAB — KAPPA/LAMBDA LIGHT CHAINS
Kappa free light chain: 121.6 mg/L — ABNORMAL HIGH (ref 3.3–19.4)
Kappa, lambda light chain ratio: 1.14 (ref 0.26–1.65)
Lambda free light chains: 106.8 mg/L — ABNORMAL HIGH (ref 5.7–26.3)

## 2019-09-23 LAB — MULTIPLE MYELOMA PANEL, SERUM
Albumin SerPl Elph-Mcnc: 2.9 g/dL (ref 2.9–4.4)
Albumin/Glob SerPl: 1 (ref 0.7–1.7)
Alpha 1: 0.3 g/dL (ref 0.0–0.4)
Alpha2 Glob SerPl Elph-Mcnc: 0.9 g/dL (ref 0.4–1.0)
B-Globulin SerPl Elph-Mcnc: 0.9 g/dL (ref 0.7–1.3)
Gamma Glob SerPl Elph-Mcnc: 1.1 g/dL (ref 0.4–1.8)
Globulin, Total: 3.1 g/dL (ref 2.2–3.9)
IgA: 76 mg/dL (ref 61–437)
IgG (Immunoglobin G), Serum: 1111 mg/dL (ref 603–1613)
IgM (Immunoglobulin M), Srm: 126 mg/dL (ref 20–172)
Total Protein ELP: 6 g/dL (ref 6.0–8.5)

## 2019-10-17 ENCOUNTER — Other Ambulatory Visit: Payer: Self-pay | Admitting: Oncology

## 2019-10-17 ENCOUNTER — Other Ambulatory Visit (INDEPENDENT_AMBULATORY_CARE_PROVIDER_SITE_OTHER): Payer: Self-pay | Admitting: Vascular Surgery

## 2019-10-17 DIAGNOSIS — N186 End stage renal disease: Secondary | ICD-10-CM

## 2019-10-19 ENCOUNTER — Ambulatory Visit (INDEPENDENT_AMBULATORY_CARE_PROVIDER_SITE_OTHER): Payer: PRIVATE HEALTH INSURANCE

## 2019-10-19 ENCOUNTER — Other Ambulatory Visit: Payer: Self-pay

## 2019-10-19 ENCOUNTER — Ambulatory Visit (INDEPENDENT_AMBULATORY_CARE_PROVIDER_SITE_OTHER): Payer: PRIVATE HEALTH INSURANCE | Admitting: Nurse Practitioner

## 2019-10-19 ENCOUNTER — Encounter (INDEPENDENT_AMBULATORY_CARE_PROVIDER_SITE_OTHER): Payer: Self-pay | Admitting: Nurse Practitioner

## 2019-10-19 ENCOUNTER — Encounter (INDEPENDENT_AMBULATORY_CARE_PROVIDER_SITE_OTHER): Payer: Self-pay

## 2019-10-19 ENCOUNTER — Ambulatory Visit (INDEPENDENT_AMBULATORY_CARE_PROVIDER_SITE_OTHER): Payer: Self-pay | Admitting: Nurse Practitioner

## 2019-10-19 VITALS — BP 147/70 | HR 80 | Ht 68.0 in | Wt 261.0 lb

## 2019-10-19 DIAGNOSIS — N186 End stage renal disease: Secondary | ICD-10-CM | POA: Diagnosis not present

## 2019-10-19 DIAGNOSIS — I10 Essential (primary) hypertension: Secondary | ICD-10-CM

## 2019-10-19 DIAGNOSIS — Z992 Dependence on renal dialysis: Secondary | ICD-10-CM | POA: Diagnosis not present

## 2019-10-19 NOTE — Progress Notes (Signed)
Subjective:    Patient ID: Peter Pearson, male    DOB: [birth year], 64 y.o.   MRN: [id number] Chief Complaint  Patient presents with  . Follow-up    U/S Follow up    Today the patient follows up for a first look of his left brachiocephalic AV fistula.  The AV fistula was created on 08/10/2019.  Since that time the patient has been maintained via PermCath and he denies any issues with it currently.  He is tolerating dialysis well.  He denies any issues with his fistula healing.  No numbness pain or tingling in the hand.  Overall his hand feels fine.  Today noninvasive studies show a flow volume of 2177.  There are some mildly elevated velocities at the anastomosis site.  The proximal upper arm shows mildly elevated velocities that could be due to a retained fistula however it is not significantly affecting the flow.  Overall the flow throughout is strong.   Review of Systems  All other systems reviewed and are negative.      Objective:   Physical Exam Vitals reviewed.  Constitutional:      Appearance: Normal appearance.  Cardiovascular:     Rate and Rhythm: Normal rate and regular rhythm.     Pulses: Normal pulses.          Radial pulses are 2+ on the right side and 2+ on the left side.     Heart sounds: Normal heart sounds.     Arteriovenous access: left arteriovenous access is present.    Comments: Good strong thrill and bruit Pulmonary:     Effort: Pulmonary effort is normal.     Breath sounds: Normal breath sounds.  Skin:    General: Skin is warm and dry.  Neurological:     Mental Status: He is alert and oriented to person, place, and time.  Psychiatric:        Mood and Affect: Mood normal.        Behavior: Behavior normal.        Thought Content: Thought content normal.        Judgment: Judgment normal.     BP (!) 147/70   Pulse 80   Ht 5\' 8"  (1.727 m)   Wt 261 lb (118.4 kg)   BMI 39.68 kg/m   Past Medical History:  Diagnosis Date  . Chronic kidney disease    . Hyperlipidemia   . Hypertension   . Sleep apnea     Social History   Socioeconomic History  . Marital status: Married    Spouse name: Shamim  . Number of children: 3  . Years of education: Not on file  . Highest education level: Not on file  Occupational History  . Occupation: [employer]   Tobacco Use  . Smoking status: Former Smoker    Packs/day: 0.00    Years: 5.00    Pack years: 0.00    Quit date: 08/06/1989    Years since quitting: 30.2  . Smokeless tobacco: Never Used  Substance and Sexual Activity  . Alcohol use: Never  . Drug use: Never  . Sexual activity: Not on file  Other Topics Concern  . Not on file  Social History Narrative  . Not on file   Social Determinants of Health   Financial Resource Strain:   . Difficulty of Paying Living Expenses:   Food Insecurity:   . Worried About Charity fundraiser in the Last Year:   . Ran  Out of Food in the Last Year:   Transportation Needs:   . Lack of Transportation (Medical):   Marland Kitchen Lack of Transportation (Non-Medical):   Physical Activity:   . Days of Exercise per Week:   . Minutes of Exercise per Session:   Stress:   . Feeling of Stress :   Social Connections:   . Frequency of Communication with Friends and Family:   . Frequency of Social Gatherings with Friends and Family:   . Attends Religious Services:   . Active Member of Clubs or Organizations:   . Attends Archivist Meetings:   Marland Kitchen Marital Status:   Intimate Partner Violence:   . Fear of Current or Ex-Partner:   . Emotionally Abused:   Marland Kitchen Physically Abused:   . Sexually Abused:     Past Surgical History:  Procedure Laterality Date  . AV FISTULA PLACEMENT Left 08/10/2019   Procedure: Left Upper Extremity AV Fistula creation;  Surgeon: Algernon Huxley, MD;  Location: [facility] ORS;  Service: Vascular;  Laterality: Left;  . DIALYSIS/PERMA CATHETER INSERTION N/A 08/08/2019   Procedure: DIALYSIS/PERMA CATHETER INSERTION;  Surgeon: Algernon Huxley,  MD;  Location: [place] [facility];  Service: Cardiovascular;  Laterality: N/A;    Family History  Problem Relation Age of Onset  . Heart disease Father     Allergies  Allergen Reactions  . 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 No Known Allergies   . Amoxicillin-Pot Clavulanate Rash       Assessment & Plan:   1. ESRD (end stage renal disease) on dialysis [facility]) Recommend:  The patient is doing well and currently has adequate dialysis access. The patient's dialysis center is not reporting any access issues. Flow pattern is stable when compared to the prior ultrasound.  The patient should have a duplex ultrasound of the dialysis access in 6 months. The patient will follow-up with me in the office after each ultrasound   The patient was marked to show where his fistula is as it feels somewhat on the deep side.  If the dialysis center is able to access his fistula without issue we will maintain his normal follow-up schedule.  However, if they do have difficulties with accessing it the patient may need a superficialization of his fistula.  Otherwise, I have sent a letter to his dialysis center to let them know that his fistula is ready to be utilized. 2. Essential hypertension Continue antihypertensive medications as already ordered, these medications have been reviewed and there are no changes at this time.    Current Outpatient Medications on File Prior to Visit  Medication Sig Dispense Refill  . allopurinol (ZYLOPRIM) 100 MG tablet Take 100 mg by mouth daily.    Marland Kitchen apixaban (ELIQUIS) 5 MG TABS tablet Take 1 tablet (5 mg total) by mouth 2 (two) times daily. For 1 week and then take 1 tablet / 5 mg twice daily for 3 more weeks. 60 tablet 0  . ascorbic acid (VITAMIN C) 500 MG tablet Take 1 tablet (500 mg total) by mouth daily. 90 tablet 0  . azithromycin  (ZITHROMAX) 250 MG tablet Take 1 pill of 250 mg daily for next 3 days. 3 each 0  . chlorpheniramine-HYDROcodone (TUSSIONEX) 10-8 MG/5ML SUER Take 5 mLs by mouth every 12 (twelve) hours as needed for cough. 140 mL 0  .  cholecalciferol (VITAMIN D) 25 MCG tablet Take 1 tablet (1,000 Units total) by mouth daily. 90 tablet 0  . diltiazem (CARDIZEM CD) 240 MG 24 hr capsule Take 240 mg by mouth daily.    . insulin detemir (LEVEMIR) 100 UNIT/ML injection Inject 0.09 mLs (9 Units total) into the skin 2 (two) times daily. 10 mL 11  . linagliptin (TRADJENTA) 5 MG TABS tablet Take 1 tablet (5 mg total) by mouth daily. 30 tablet 1  . midodrine (PROAMATINE) 5 MG tablet Take 5 mg of midodrine on dialysis days (Tuesday, Thursday, and Saturday) 20 tablet 0  . pyridOXINE (VITAMIN B-6) 50 MG tablet Take 50-100 mg by mouth daily.     . rosuvastatin (CRESTOR) 20 MG tablet Take 10 mg by mouth daily.    . tamsulosin (FLOMAX) 0.4 MG CAPS capsule Take 0.4 mg by mouth daily.    Marland Kitchen zinc sulfate 220 (50 Zn) MG capsule Take 1 capsule (220 mg total) by mouth daily. 90 capsule 0   No current facility-administered medications on file prior to visit.    There are no Patient Instructions on file for this visit. No follow-ups on file.   Kris Hartmann, NP

## 2019-10-20 ENCOUNTER — Inpatient Hospital Stay: Payer: PRIVATE HEALTH INSURANCE | Admitting: Oncology

## 2019-10-20 ENCOUNTER — Inpatient Hospital Stay: Payer: PRIVATE HEALTH INSURANCE

## 2019-10-27 ENCOUNTER — Inpatient Hospital Stay: Payer: PRIVATE HEALTH INSURANCE | Attending: Oncology

## 2019-10-27 ENCOUNTER — Encounter (INDEPENDENT_AMBULATORY_CARE_PROVIDER_SITE_OTHER): Payer: Self-pay

## 2019-10-27 ENCOUNTER — Inpatient Hospital Stay (HOSPITAL_BASED_OUTPATIENT_CLINIC_OR_DEPARTMENT_OTHER): Payer: PRIVATE HEALTH INSURANCE | Admitting: Oncology

## 2019-10-27 ENCOUNTER — Encounter: Payer: Self-pay | Admitting: Oncology

## 2019-10-27 ENCOUNTER — Other Ambulatory Visit: Payer: Self-pay

## 2019-10-27 VITALS — BP 148/84 | HR 76 | Temp 96.5°F | Resp 18 | Wt 261.6 lb

## 2019-10-27 DIAGNOSIS — Z992 Dependence on renal dialysis: Secondary | ICD-10-CM | POA: Insufficient documentation

## 2019-10-27 DIAGNOSIS — D7582 Heparin induced thrombocytopenia (HIT): Secondary | ICD-10-CM | POA: Insufficient documentation

## 2019-10-27 DIAGNOSIS — Z79899 Other long term (current) drug therapy: Secondary | ICD-10-CM | POA: Diagnosis not present

## 2019-10-27 DIAGNOSIS — D631 Anemia in chronic kidney disease: Secondary | ICD-10-CM | POA: Diagnosis not present

## 2019-10-27 DIAGNOSIS — D75829 Heparin-induced thrombocytopenia, unspecified: Secondary | ICD-10-CM

## 2019-10-27 DIAGNOSIS — E1122 Type 2 diabetes mellitus with diabetic chronic kidney disease: Secondary | ICD-10-CM | POA: Diagnosis not present

## 2019-10-27 DIAGNOSIS — N185 Chronic kidney disease, stage 5: Secondary | ICD-10-CM

## 2019-10-27 DIAGNOSIS — D721 Eosinophilia, unspecified: Secondary | ICD-10-CM | POA: Diagnosis not present

## 2019-10-27 DIAGNOSIS — I129 Hypertensive chronic kidney disease with stage 1 through stage 4 chronic kidney disease, or unspecified chronic kidney disease: Secondary | ICD-10-CM | POA: Diagnosis not present

## 2019-10-27 DIAGNOSIS — N186 End stage renal disease: Secondary | ICD-10-CM | POA: Diagnosis not present

## 2019-10-27 LAB — CBC WITH DIFFERENTIAL/PLATELET
Abs Immature Granulocytes: 0.01 10*3/uL (ref 0.00–0.07)
Basophils Absolute: 0.1 10*3/uL (ref 0.0–0.1)
Basophils Relative: 1 %
Eosinophils Absolute: 0.6 10*3/uL — ABNORMAL HIGH (ref 0.0–0.5)
Eosinophils Relative: 7 %
HCT: 36.4 % — ABNORMAL LOW (ref 39.0–52.0)
Hemoglobin: 11.6 g/dL — ABNORMAL LOW (ref 13.0–17.0)
Immature Granulocytes: 0 %
Lymphocytes Relative: 41 %
Lymphs Abs: 3.1 10*3/uL (ref 0.7–4.0)
MCH: 27.9 pg (ref 26.0–34.0)
MCHC: 31.9 g/dL (ref 30.0–36.0)
MCV: 87.5 fL (ref 80.0–100.0)
Monocytes Absolute: 0.5 10*3/uL (ref 0.1–1.0)
Monocytes Relative: 6 %
Neutro Abs: 3.4 10*3/uL (ref 1.7–7.7)
Neutrophils Relative %: 45 %
Platelets: 271 10*3/uL (ref 150–400)
RBC: 4.16 MIL/uL — ABNORMAL LOW (ref 4.22–5.81)
RDW: 14.1 % (ref 11.5–15.5)
WBC: 7.6 10*3/uL (ref 4.0–10.5)
nRBC: 0 % (ref 0.0–0.2)

## 2019-10-27 NOTE — Progress Notes (Signed)
Hematology/Oncology Progress Note [facility] [phone] [phone]  Patient Care Team: Gaetano Hawthorne as PCP - General (Cardiology)   Name of the patient: Peter Pearson  [id number]  1956-05-25  Date of visit: 10/27/19  Reason for visit Follow up for HIT  INTERVAL HISTORY-  64 year old male with past medical history of ESRD on hemodialysis, recent COVID-19 infection, sleep apnea, obesity, hyperlipidemia, thrombocytopenia/HIT presented for posthospitalization follow-up. Patient was admitted from 08/23/2019-08/27/2019 due to COVID-19 pneumonia, respiratory failure with  hypoxia.  Patient was found to have acute thrombocytopenia with a count of 19,000.  Work-up for HIT was sent.  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 Patient was started on Eliquis 10 mg twice daily, I recommended him to continue Eliquis 10 mg twice daily for 7 days or until platelet recovers to be above 1 50,000, whichever is longer, then switch to Eliquis 5 mg twice daily. Due to recent COVID-19 infection, he is not able to come to cancer center to have blood draws for 2 weeks.  I discussed with patient's nephrologist Dr. Holley Raring and hospitalist and I recommend patient to have CBC done when he gets dialysis, approximately 3 days and 7 days  after discharge and have the results sent to my office. Patient had blood work done on 08/30/2019.  I did not get results faxed over to me and to 1 week after that.  Results reviewed, platelet count continues to be low at 40,000.  I communicated with Dr. Holley Raring on 09/09/2019.  Patient has blood work done on 09/10/2019 which showed platelet count of 60,000.  09/12/2019 He says he finishes Eliquis 54m BID and decreased to Elqiuis 569mBID. With further questioning, he admits to only taking Eliquis 94m62mnce daily as he is concerned about low platelets.   INTERVAL  HISTORY Peter Pearson a 64 33o. male who has above history reviewed by me today presents for follow up visit for management of thrombocytopenia Problems and complaints are listed below: Patient was accompanied by his daughter. At the last visit, patient was recommended to take Eliquis 5 mg twice daily. Today patient tells me that has been only taking 5 mg daily because he does not want his blood be "too thin".  He denies any bleeding events. No new complaints. Denies any SOB or breath difficulty.   Review of systems- Review of Systems  Constitutional: Negative for appetite change, chills, fatigue, fever and unexpected weight change.  HENT:   Positive for hearing loss. Negative for voice change.   Eyes: Negative for eye problems and icterus.  Respiratory: Negative for chest tightness, cough and shortness of breath.   Cardiovascular: Negative for chest pain and leg swelling.  Gastrointestinal: Negative for abdominal distention and abdominal pain.  Endocrine: Negative for hot flashes.  Genitourinary: Negative for difficulty urinating, dysuria and frequency.   Musculoskeletal: Negative for arthralgias.  Skin: Negative for itching and rash.  Neurological: Negative for light-headedness and numbness.  Hematological: Negative for adenopathy. Does not bruise/bleed easily.  Psychiatric/Behavioral: Negative for confusion.    Allergies  Allergen Reactions  . 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 No Known Allergies   . Amoxicillin-Pot Clavulanate Rash    Patient Active Problem List   Diagnosis Date Noted  . ESRD (  end stage renal disease) on dialysis ([place]) 09/07/2019  . Cough   . HIT (heparin-induced thrombocytopenia) ([place])   . COVID-19 08/24/2019  . Elevated troponin   . Thrombocytopenia ([place])   . Tachycardia 08/23/2019  . ESRD (end stage renal disease) ([place])  08/07/2019  . Anemia of chronic kidney failure, stage 5 ([place]) 08/07/2019  . Acute flank pain 09/21/2017  . Acute gout due to renal impairment involving left foot 08/14/2017  . 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 Chronic conjunctivitis 08/20/2015  . Benign prostatic hyperplasia with lower urinary tract symptoms 02/19/2015  . Vitamin D deficiency 01/17/2014  . Diabetes mellitus with renal manifestation ([place]) 12/10/2012  . Obesity 12/10/2012  . OSA on CPAP 12/10/2012  . Stage 5 chronic kidney disease not on chronic dialysis ([place]) 12/10/2012  . Essential hypertension 12/07/2012     Past Medical History:  Diagnosis Date  . Chronic kidney disease   . Hyperlipidemia   . Hypertension   . Sleep apnea      Past Surgical History:  Procedure Laterality Date  . AV FISTULA PLACEMENT Left 08/10/2019   Procedure: Left Upper Extremity AV Fistula creation;  Surgeon: Algernon Huxley, MD;  Location: [facility] ORS;  Service: Vascular;  Laterality: Left;  . DIALYSIS/PERMA CATHETER INSERTION N/A 08/08/2019   Procedure: DIALYSIS/PERMA CATHETER INSERTION;  Surgeon: Algernon Huxley, MD;  Location: [place] [facility];  Service: Cardiovascular;  Laterality: N/A;    Social History   Socioeconomic History  . Marital status: Married    Spouse name: Peter Pearson  . Number of children: 3  . Years of education: Not on file  . Highest education level: Not on file  Occupational History  . Occupation: [employer]   Tobacco Use  . Smoking status: Former Smoker    Packs/day: 0.00    Years: 5.00    Pack years: 0.00    Quit date: 08/06/1989    Years since quitting: 30.2  . Smokeless tobacco: Never Used  Substance and Sexual Activity  . Alcohol use: Never  . Drug use: Never  . Sexual activity: Not on file  Other Topics Concern  . Not on file  Social History Narrative  . Not on file   Social Determinants of Health   Financial Resource Strain:   . Difficulty of Paying Living Expenses:   Food Insecurity:   .  Worried About Charity fundraiser in the Last Year:   . Arboriculturist in the Last Year:   Transportation Needs:   . Film/video editor (Medical):   Marland Kitchen Lack of Transportation (Non-Medical):   Physical Activity:   . Days of Exercise per Week:   . Minutes of Exercise per Session:   Stress:   . Feeling of Stress :   Social Connections:   . Frequency of Communication with Friends and Family:   . Frequency of Social Gatherings with Friends and Family:   . Attends Religious Services:   . Active Member of Clubs or Organizations:   . Attends Archivist Meetings:   Marland Kitchen Marital Status:   Intimate Partner Violence:   . Fear of Current or Ex-Partner:   . Emotionally Abused:   Marland Kitchen Physically Abused:   . Sexually Abused:      Family History  Problem Relation Age of Onset  . Heart disease Father      Current Outpatient Medications:  .  allopurinol (ZYLOPRIM) 100 MG tablet, Take 100 mg by mouth daily., Disp: , Rfl:  .  apixaban (ELIQUIS) 5 MG TABS tablet, Take 1 tablet (5 mg total) by mouth 2 (two) times daily. For 1 week and then take 1 tablet / 5 mg twice daily for 3 more weeks., Disp: 60 tablet, Rfl: 0 .  ascorbic acid (VITAMIN C) 500 MG tablet, Take 1 tablet (500 mg total) by mouth daily., Disp: 90 tablet, Rfl: 0 .  cholecalciferol (VITAMIN D) 25 MCG tablet, Take 1 tablet (1,000 Units total) by mouth daily., Disp: 90 tablet, Rfl: 0 .  diltiazem (CARDIZEM CD) 240 MG 24 hr capsule, Take 240 mg by mouth daily., Disp: , Rfl:  .  pyridOXINE (VITAMIN B-6) 50 MG tablet, Take 50-100 mg by mouth daily. , Disp: , Rfl:  .  rosuvastatin (CRESTOR) 20 MG tablet, Take 10 mg by mouth daily., Disp: , Rfl:  .  tamsulosin (FLOMAX) 0.4 MG CAPS capsule, Take 0.4 mg by mouth daily., Disp: , Rfl:  .  azithromycin (ZITHROMAX) 250 MG tablet, Take 1 pill of 250 mg daily for next 3 days. (Patient not taking: Reported on 10/27/2019), Disp: 3 each, Rfl: 0 .  chlorpheniramine-HYDROcodone (TUSSIONEX) 10-8  MG/5ML SUER, Take 5 mLs by mouth every 12 (twelve) hours as needed for cough. (Patient not taking: Reported on 10/27/2019), Disp: 140 mL, Rfl: 0 .  insulin detemir (LEVEMIR) 100 UNIT/ML injection, Inject 0.09 mLs (9 Units total) into the skin 2 (two) times daily. (Patient not taking: Reported on 10/27/2019), Disp: 10 mL, Rfl: 11 .  linagliptin (TRADJENTA) 5 MG TABS tablet, Take 1 tablet (5 mg total) by mouth daily. (Patient not taking: Reported on 10/27/2019), Disp: 30 tablet, Rfl: 1 .  midodrine (PROAMATINE) 5 MG tablet, Take 5 mg of midodrine on dialysis days (Tuesday, Thursday, and Saturday) (Patient not taking: Reported on 10/27/2019), Disp: 20 tablet, Rfl: 0 .  zinc sulfate 220 (50 Zn) MG capsule, Take 1 capsule (220 mg total) by mouth daily. (Patient not taking: Reported on 10/27/2019), Disp: 90 capsule, Rfl: 0   Physical exam:  Vitals:   10/27/19 0903  BP: (!) 148/84  Pulse: 76  Resp: 18  Temp: (!) 96.5 F (35.8 C)  TempSrc: Tympanic  Weight: 261 lb 9.6 oz (118.7 kg)   Physical Exam  Constitutional: He is oriented to person, place, and time. No distress.  Obese.  HENT:  Head: Normocephalic and atraumatic.  Nose: Nose normal.  Mouth/Throat: Oropharynx is clear and moist. No oropharyngeal exudate.  Eyes: Pupils are equal, round, and reactive to light. EOM are normal. No scleral icterus.  Cardiovascular: Normal rate and regular rhythm.  No murmur heard. Pulmonary/Chest: Effort normal and breath sounds normal. No respiratory distress. He has no rales. He exhibits no tenderness.  Abdominal: Soft. Bowel sounds are normal. He exhibits no distension. There is no abdominal tenderness.  Musculoskeletal:        General: Edema present. Normal range of motion.     Cervical back: Normal range of motion and neck supple.  Neurological: He is alert and oriented to person, place, and time. No cranial nerve deficit. He exhibits normal muscle tone. Coordination normal.  Skin: Skin is warm and dry. He  is not diaphoretic. No erythema.  Psychiatric: Affect normal.       CMP Latest Ref Rng & Units 08/27/2019  Glucose 70 - 99 mg/dL 177(H)  BUN 8 - 23 mg/dL 100(H)  Creatinine 0.61 - 1.24 mg/dL 6.54(H)  Sodium 135 - 145 mmol/L 135  Potassium 3.5 - 5.1 mmol/L 4.1  Chloride 98 - 111  mmol/L 98  CO2 22 - 32 mmol/L 19(L)  Calcium 8.9 - 10.3 mg/dL 7.9(L)  Total Protein 6.5 - 8.1 g/dL 6.2(L)  Total Bilirubin 0.3 - 1.2 mg/dL 0.4  Alkaline Phos 38 - 126 U/L 58  AST 15 - 41 U/L 22  ALT 0 - 44 U/L 29   CBC Latest Ref Rng & Units 10/27/2019  WBC 4.0 - 10.5 K/uL 7.6  Hemoglobin 13.0 - 17.0 g/dL 11.6(L)  Hematocrit 39.0 - 52.0 % 36.4(L)  Platelets 150 - 400 K/uL 271    RADIOGRAPHIC STUDIES: I have personally reviewed the radiological images as listed and agreed with the findings in the report. VAS US DUPLEX DIALYSIS ACCESS (AVF, AVG)  Result Date: 10/25/2019 DIALYSIS ACCESS Reason for Exam: Routine follow up. Access Site: Left Upper Extremity. Access Type: Brachial-cephalic AVF. History: Created 08/10/2019 First Look. Performing Technologist: Concha Norway RVT  Examination Guidelines: A complete evaluation includes B-mode imaging, spectral Doppler, color Doppler, and power Doppler as needed of all accessible portions of each vessel. Unilateral testing is considered an integral part of a complete examination. Limited examinations for reoccurring indications may be performed as noted.  Findings: +--------------------+----------+-----------------+--------+ AVF                 PSV (cm/s)Flow Vol (mL/min)Comments +--------------------+----------+-----------------+--------+ Native artery inflow   233          2177                +--------------------+----------+-----------------+--------+ AVF Anastomosis        653                              +--------------------+----------+-----------------+--------+  +---------------+----------+-------------+----------+--------+ OUTFLOW VEIN   PSV  (cm/s)Diameter (cm)Depth (cm)Describe +---------------+----------+-------------+----------+--------+ Subclavian vein    41                                    +---------------+----------+-------------+----------+--------+ Confluence        242                                    +---------------+----------+-------------+----------+--------+ Prox UA           444                                    +---------------+----------+-------------+----------+--------+ Mid UA            195                                    +---------------+----------+-------------+----------+--------+ Dist UA           218                                    +---------------+----------+-------------+----------+--------+  +----------------+-------------+----------+---------+----------+---------------+                 Diameter (cm)Depth (cm)BranchingPSV (cm/s)  Flow Volume                                                                (  ml/min)     +----------------+-------------+----------+---------+----------+---------------+ Left distal rad                                     70                    art                                                                       +----------------+-------------+----------+---------+----------+---------------+ Antegrade                                                                 +----------------+-------------+----------+---------+----------+---------------+  Summary: Widely patent new left BrachCeph AVF with mildly elevated velocities at the anastomosis site. The flow throughout the AVF is strong. Proximal upper arm region shows mildly elevated velocities that could be caused by a retained valve but again does not significantly affect flow.  *See table(s) above for measurements and observations.  Diagnosing physician: Leotis Pain MD Electronically signed by Leotis Pain MD on 10/25/2019 at 8:48:22 AM.     --------------------------------------------------------------------------------   Final     Assessment and plan-  Patient is a 64 y.o. male with history of OSA, ESRD on HD, recently diagnosis of HIT presents for follow up.  1. HIT (heparin-induced thrombocytopenia) (HCC)   2. Anemia of chronic kidney failure, stage 5 (HCC)    # Thrombocytopenia, with positive HIT antibody 2.625 OD, and SRA 99%, supporting the diagnosis of HIT.  He was asymptomatic, lower extremity US showed no DVT.  Patient has been on anticoagulation for more than 4 weeks, though he did not take Eliquis as instructed. Labs are reviewed and discussed with patient. His platelet counts have completely normalized and remains stable. I instructed patient to discontinue Eliquis. Patient will be discharged from my clinic and I recommend patient to follow-up with nephrologist or primary care in 3 months and repeat CBC. He voices understanding.   # Anemia of CKD, hemoglobin level has improved to 11.6. Continue follow-up with nephrology for management of anemia and CKD.  No intervention needed at this point.  Multiple myeloma panel is negative.  Mild eosinophilia on today's CBC most likely due to reactive process.   Earlie Server, MD, PhD Hematology Oncology [facility] at [facility] Pager- [phone] 10/27/2019

## 2019-10-27 NOTE — Progress Notes (Signed)
Patient here for follow up. No new concerns voiced. Pt reports having pain to both legs when he stands up.

## 2019-12-06 ENCOUNTER — Telehealth (INDEPENDENT_AMBULATORY_CARE_PROVIDER_SITE_OTHER): Payer: Self-pay

## 2019-12-06 NOTE — Telephone Encounter (Signed)
A fax was received from [facility] for the patient to have a permcath removal. Patient is schedule with Dr. Lucky Cowboy on 074/12/21 with a 12:00 pm arrival time to the [facility]. Patient will do covid testing on 12/09/19 between 8-1 pm at the [place]. Pre-procedure instructions were faxed back to [facility].

## 2019-12-08 ENCOUNTER — Other Ambulatory Visit: Payer: PRIVATE HEALTH INSURANCE

## 2019-12-09 ENCOUNTER — Other Ambulatory Visit: Payer: Self-pay

## 2019-12-09 ENCOUNTER — Other Ambulatory Visit
Admission: RE | Admit: 2019-12-09 | Discharge: 2019-12-09 | Disposition: A | Discharge status: Home / Self Care | Payer: PRIVATE HEALTH INSURANCE | Source: Ambulatory Visit | Attending: Vascular Surgery | Admitting: Vascular Surgery

## 2019-12-09 DIAGNOSIS — Z01812 Encounter for preprocedural laboratory examination: Secondary | ICD-10-CM | POA: Insufficient documentation

## 2019-12-09 DIAGNOSIS — Z20822 Contact with and (suspected) exposure to covid-19: Secondary | ICD-10-CM | POA: Diagnosis not present

## 2019-12-10 LAB — SARS CORONAVIRUS 2 (TAT 6-24 HRS): SARS Coronavirus 2: NEGATIVE

## 2019-12-12 ENCOUNTER — Other Ambulatory Visit (INDEPENDENT_AMBULATORY_CARE_PROVIDER_SITE_OTHER): Payer: Self-pay | Admitting: Nurse Practitioner

## 2019-12-13 ENCOUNTER — Ambulatory Visit
Admission: RE | Admit: 2019-12-13 | Discharge: 2019-12-13 | Disposition: A | Discharge status: Home / Self Care | Payer: Medicare Other | Attending: Vascular Surgery | Admitting: Vascular Surgery

## 2019-12-13 ENCOUNTER — Encounter
Admission: RE | Disposition: A | Discharge status: Home / Self Care | Payer: Self-pay | Source: Home / Self Care | Attending: Vascular Surgery

## 2019-12-13 ENCOUNTER — Encounter: Payer: Self-pay | Admitting: Vascular Surgery

## 2019-12-13 ENCOUNTER — Other Ambulatory Visit: Payer: Self-pay

## 2019-12-13 DIAGNOSIS — G4730 Sleep apnea, unspecified: Secondary | ICD-10-CM | POA: Diagnosis not present

## 2019-12-13 DIAGNOSIS — I120 Hypertensive chronic kidney disease with stage 5 chronic kidney disease or end stage renal disease: Secondary | ICD-10-CM | POA: Insufficient documentation

## 2019-12-13 DIAGNOSIS — E785 Hyperlipidemia, unspecified: Secondary | ICD-10-CM | POA: Insufficient documentation

## 2019-12-13 DIAGNOSIS — Z880 Allergy status to penicillin: Secondary | ICD-10-CM | POA: Insufficient documentation

## 2019-12-13 DIAGNOSIS — Z87891 Personal history of nicotine dependence: Secondary | ICD-10-CM | POA: Diagnosis not present

## 2019-12-13 DIAGNOSIS — Z992 Dependence on renal dialysis: Secondary | ICD-10-CM | POA: Diagnosis not present

## 2019-12-13 DIAGNOSIS — Z4901 Encounter for fitting and adjustment of extracorporeal dialysis catheter: Secondary | ICD-10-CM | POA: Insufficient documentation

## 2019-12-13 DIAGNOSIS — N186 End stage renal disease: Secondary | ICD-10-CM

## 2019-12-13 DIAGNOSIS — Z888 Allergy status to other drugs, medicaments and biological substances status: Secondary | ICD-10-CM | POA: Insufficient documentation

## 2019-12-13 DIAGNOSIS — E1122 Type 2 diabetes mellitus with diabetic chronic kidney disease: Secondary | ICD-10-CM | POA: Insufficient documentation

## 2019-12-13 HISTORY — PX: DIALYSIS/PERMA CATHETER REMOVAL: CATH118289

## 2019-12-13 SURGERY — DIALYSIS/PERMA CATHETER REMOVAL
Anesthesia: LOCAL

## 2019-12-13 MED ORDER — LIDOCAINE-EPINEPHRINE (PF) 1 %-1:200000 IJ SOLN
INTRAMUSCULAR | Status: DC | PRN
  Administered 2019-12-13: 20 mL

## 2019-12-13 SURGICAL SUPPLY — 4 items
CHLORAPREP W/TINT 26 (MISCELLANEOUS) ×4 IMPLANT
FORCEPS HALSTEAD CVD 5IN STRL (INSTRUMENTS) ×2 IMPLANT
SCALPEL PROTECTED #11 DISP (BLADE) ×2 IMPLANT
TRAY LACERAT/PLASTIC (MISCELLANEOUS) ×2 IMPLANT

## 2019-12-13 NOTE — Op Note (Signed)
Operative Note  Preoperative diagnosis:    1. ESRD with functional permanent access  Postoperative diagnosis:   1. ESRD with functional permanent access  Procedure:  Removal of Right Permcath  Performing Clinician: Hezzie Bump PA-C  Surgeon:  Ella Jubilee, MD  Anesthesia:  Local  EBL:  Minimal  Indication for the Procedure:  The patient has a functional permanent dialysis access and no longer needs their permcath.  This can be removed.  Risks and benefits are discussed and informed consent is obtained.  Description of the Procedure:  The patient's right neck, chest and existing catheter were sterilely prepped and draped. The area around the catheter was anesthetized copiously with 1% lidocaine. The catheter was dissected out with curved hemostats until the cuff was freed from the surrounding fibrous sheath. The fiber sheath was transected, and the catheter was then removed in its entirety using gentle traction. Pressure was held and sterile dressings were placed. The patient tolerated the procedure well and was taken to the recovery room in stable condition.  Delio Slates A Mays Paino  12/13/2019, 1:40 PM  This note was created with Dragon Medical transcription system. Any errors in dictation are purely unintentional.

## 2019-12-13 NOTE — Discharge Instructions (Signed)
Tunneled Catheter Removal, Care After Refer to this sheet in the next few weeks. These instructions provide you with information about caring for yourself after your procedure. Your health care provider may also give you more specific instructions. Your treatment has been planned according to current medical practices, but problems sometimes occur. Call your health care provider if you have any problems or questions after your procedure. What can I expect after the procedure? After the procedure, it is common to have: Some mild redness, swelling, and pain around your catheter site.   Follow these instructions at home: Incision care  Check your removal site  every day for signs of infection. Check for: More redness, swelling, or pain. More fluid or blood. Warmth. Pus or a bad smell. Remove your dressing in 48hrs leave open to air  Activity  Return to your normal activities as told by your health care provider. Ask your health care provider what activities are safe for you. Do not lift anything that is heavier than 10 lb (4.5 kg) for 3 days  You may shower tomorrow  Contact a health care provider if: You have more fluid or blood coming from your removal site You have more redness, swelling, or pain at your incisions or around the area where your catheter was removed Your removal site feel warm to the touch. You feel unusually weak. You feel nauseous.. Get help right away if You have swelling in your arm, shoulder, neck, or face. You develop chest pain. You have difficulty breathing. You feel dizzy or light-headed. You have pus or a bad smell coming from your removal site You have a fever. You develop bleeding from your removal site, and your bleeding does not stop. This information is not intended to replace advice given to you by your health care provider. Make sure you discuss any questions you have with your health care provider. Document Released: 05/05/2012 Document Revised:  01/20/2016 Document Reviewed: 02/12/2015 Elsevier Interactive Patient Education  2017 Elsevier Inc. 

## 2019-12-13 NOTE — H&P (Signed)
[facility] Admission History & Physical  MRN : [id number]  Peter Pearson Age is a 64 y.o. (Dec 24, 1955) male who presents with chief complaint of scheduled PermCath removal.  History of Present Illness:  I am asked to evaluate the patient by the dialysis center. The patient was sent here because they have a functioning upper extremity dialysis access and is no longer in need of a PermCath. The patient reports they're not been any problems with any of their dialysis runs. They are reporting good flows with good parameters at dialysis. Patient denies pain or tenderness overlying the access.  There is no pain with dialysis.  The patient denies hand pain or finger pain consistent with steal syndrome.  No fevers or chills while on dialysis.  No current facility-administered medications for this encounter.   Past Medical History:  Diagnosis Date  . Chronic kidney disease   . Hyperlipidemia   . Hypertension   . Sleep apnea    Past Surgical History:  Procedure Laterality Date  . AV FISTULA PLACEMENT Left 08/10/2019   Procedure: Left Upper Extremity AV Fistula creation;  Surgeon: Algernon Huxley, MD;  Location: [facility];  Service: Vascular;  Laterality: Left;  . DIALYSIS/PERMA CATHETER INSERTION N/A 08/08/2019   Procedure: DIALYSIS/PERMA CATHETER INSERTION;  Surgeon: Algernon Huxley, MD;  Location: [facility];  Service: Cardiovascular;  Laterality: N/A;   Social History Social History   Tobacco Use  . Smoking status: Former Smoker    Packs/day: 0.00    Years: 5.00    Pack years: 0.00    Quit date: 08/06/1989    Years since quitting: 30.3  . Smokeless tobacco: Never Used  Vaping Use  . Vaping Use: Never used  Substance Use Topics  . Alcohol use: Never  . Drug use: Never   Family History Family History  Problem Relation Age of Onset  . Heart disease Father   No family history of bleeding or clotting disorders, autoimmune disease or porphyria  Allergies   Allergen Reactions  . 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 Amoxicillin-Pot Clavulanate Rash   REVIEW OF SYSTEMS (Negative unless checked)  Constitutional: [] Weight loss  [] Fever  [] Chills Cardiac: [] Chest pain   [] Chest pressure   [] Palpitations   [] Shortness of breath when laying flat   [] Shortness of breath at rest   [x] Shortness of breath with exertion. Vascular:  [] Pain in legs with walking   [] Pain in legs at rest   [] Pain in legs when laying flat   [] Claudication   [] Pain in feet when walking  [] Pain in feet at rest  [] Pain in feet when laying flat   [] History of DVT   [] Phlebitis   [] Swelling in legs   [] Varicose veins   [] Non-healing ulcers Pulmonary:   [] Uses home oxygen   [] Productive cough   [] Hemoptysis   [] Wheeze  [] COPD   [] Asthma Neurologic:  [] Dizziness  [] Blackouts   [] Seizures   [] History of stroke   [] History of TIA  [] Aphasia   [] Temporary blindness   [] Dysphagia   [] Weakness or numbness in arms   [] Weakness or numbness in legs Musculoskeletal:  [x] Arthritis   [] Joint swelling   [] Joint pain   [] Low back pain Hematologic:  [] Easy bruising  [] Easy bleeding   [] Hypercoagulable state   [] Anemic  [] Hepatitis Gastrointestinal:  [] Blood in stool   []   Vomiting blood  [] Gastroesophageal reflux/heartburn   [] Difficulty swallowing. Genitourinary:  [x] Chronic kidney disease   [] Difficult urination  [] Frequent urination  [] Burning with urination   [] Blood in urine Skin:  [] Rashes   [] Ulcers   [] Wounds Psychological:  [] History of anxiety   []  History of major depression.  Physical Examination  Vitals:   12/13/19 1238  BP: 140/66  Resp: 16  Temp: 97.9 F (36.6 C)  TempSrc: Oral   There is no height or weight on file to calculate BMI. Gen: WD/WN, NAD Head: [place]/AT, No temporalis wasting. Prominent temp pulse not noted. Ear/Nose/Throat: Hearing grossly  intact, nares w/o erythema or drainage, oropharynx w/o Erythema/Exudate,  Eyes: Conjunctiva clear, sclera non-icteric Neck: Trachea midline.  No JVD.  Pulmonary:  Good air movement, respirations not labored, no use of accessory muscles.  Cardiac: RRR, normal S1, S2. Vascular:  Vessel Right Left  Radial Palpable Palpable  Ulnar Not Palpable Not Palpable  Brachial Palpable Palpable  Carotid Palpable, without bruit Palpable, without bruit  Gastrointestinal: soft, non-tender/non-distended. No guarding/reflex.  Musculoskeletal: M/S 5/5 throughout.  Extremities without ischemic changes.  No deformity or atrophy.  Neurologic: Sensation grossly intact in extremities.  Symmetrical.  Speech is fluent. Motor exam as listed above. Psychiatric: Judgment intact, Mood & affect appropriate for pt's clinical situation. Dermatologic: No rashes or ulcers noted.  No cellulitis or open wounds. Lymph : No Cervical, Axillary, or Inguinal lymphadenopathy.  CBC Lab Results  Component Value Date   WBC 7.6 10/27/2019   HGB 11.6 (L) 10/27/2019   HCT 36.4 (L) 10/27/2019   MCV 87.5 10/27/2019   PLT 271 10/27/2019   BMET    Component Value Date/Time   NA 135 08/27/2019 0816   K 4.1 08/27/2019 0816   CL 98 08/27/2019 0816   CO2 19 (L) 08/27/2019 0816   GLUCOSE 177 (H) 08/27/2019 0816   BUN 100 (H) 08/27/2019 0816   CREATININE 6.54 (H) 08/27/2019 0816   CALCIUM 7.9 (L) 08/27/2019 0816   GFRNONAA 8 (L) 08/27/2019 0816   GFRAA 10 (L) 08/27/2019 0816   CrCl cannot be calculated (Patient's most recent lab result is older than the maximum 21 days allowed.).  COAG Lab Results  Component Value Date   INR 1.1 08/10/2019   INR 1.2 08/08/2019   Radiology No results found.  Assessment/Plan I am asked to evaluate the patient by the dialysis center. The patient was sent here because they have a functioning upper extremity dialysis access and is no longer in need of a PermCath.  1.  Complication dialysis  device with thrombosis AV access:   The patient has an extremity access that is functioning well. Therefore, the patient will undergo removal of the tunneled catheter under local anesthesia.  The risks and benefits were described to the patient.  All questions were answered.  The patient agrees to proceed with angiography and intervention. Potassium will be drawn to ensure that it is an appropriate level prior to performing intervention. 2.  End-stage renal disease requiring hemodialysis:  Patient will continue dialysis therapy without further interruption if a successful intervention is not achieved then a tunneled catheter will be placed. Dialysis has already been arranged. 3.  Hypertension:  Patient will continue medical management; nephrology is following no changes in oral medications. 4. Diabetes mellitus:  Glucose will be monitored and oral medications been held this morning once the patient has undergone the patient's procedure po intake will be reinitiated and again Accu-Cheks will be used to assess the blood  glucose level and treat as needed. The patient will be restarted on the patient's usual hypoglycemic regime  Discussed with Dr. Francene Castle, PA-C  12/13/2019 1:31 PM

## 2019-12-14 ENCOUNTER — Encounter: Payer: Self-pay | Admitting: Vascular Surgery

## 2020-04-05 DIAGNOSIS — E119 Type 2 diabetes mellitus without complications: Secondary | ICD-10-CM | POA: Insufficient documentation

## 2020-04-18 ENCOUNTER — Encounter (INDEPENDENT_AMBULATORY_CARE_PROVIDER_SITE_OTHER): Payer: PRIVATE HEALTH INSURANCE

## 2020-04-18 ENCOUNTER — Ambulatory Visit (INDEPENDENT_AMBULATORY_CARE_PROVIDER_SITE_OTHER): Payer: PRIVATE HEALTH INSURANCE | Admitting: Nurse Practitioner

## 2020-04-24 ENCOUNTER — Encounter: Payer: Self-pay | Admitting: Gastroenterology

## 2020-04-24 ENCOUNTER — Other Ambulatory Visit: Payer: Self-pay

## 2020-04-24 ENCOUNTER — Ambulatory Visit (INDEPENDENT_AMBULATORY_CARE_PROVIDER_SITE_OTHER): Payer: Medicare Other | Admitting: Gastroenterology

## 2020-04-24 ENCOUNTER — Telehealth: Payer: Self-pay

## 2020-04-24 VITALS — BP 130/67 | HR 80 | Temp 98.1°F | Ht 68.0 in | Wt 265.0 lb

## 2020-04-24 DIAGNOSIS — N186 End stage renal disease: Secondary | ICD-10-CM

## 2020-04-24 DIAGNOSIS — Z1211 Encounter for screening for malignant neoplasm of colon: Secondary | ICD-10-CM

## 2020-04-24 MED ORDER — NA SULFATE-K SULFATE-MG SULF 17.5-3.13-1.6 GM/177ML PO SOLN: ORAL | 0 refills | Status: DC

## 2020-04-24 NOTE — Progress Notes (Signed)
Peter Pearson [address]  [place]  [place],  [address]  Main: [phone]  Fax: [phone]   Gastroenterology Consultation  Referring Provider:     Anthonette Legato, MD Primary Care Physician:  Debbrah Alar, PA-C Reason for Consultation:     Awaiting kidney transplant, preop colonoscopy        HPI:    Chief Complaint  Patient presents with  . End stage renal disease    Peter Pearson is a 64 y.o. y/o male referred for consultation & management  by Dr. Burney Gauze, Trenton Gammon, PA-C.  Patient reports previous history of colonoscopy being over 15 years ago that was normal.  Procedure report not available.  This was done out of town.  Patient denies any family history of colon cancer. The patient denies abdominal or flank pain, anorexia, nausea or vomiting, dysphagia, change in bowel habits or black or bloody stools or weight loss.   Past Medical History:  Diagnosis Date  . Chronic kidney disease   . Hyperlipidemia   . Hypertension   . Sleep apnea     Past Surgical History:  Procedure Laterality Date  . AV FISTULA PLACEMENT Left 08/10/2019   Procedure: Left Upper Extremity AV Fistula creation;  Surgeon: Algernon Huxley, MD;  Location: [facility];  Service: Vascular;  Laterality: Left;  . DIALYSIS/PERMA CATHETER INSERTION N/A 08/08/2019   Procedure: DIALYSIS/PERMA CATHETER INSERTION;  Surgeon: Algernon Huxley, MD;  Location: [facility];  Service: Cardiovascular;  Laterality: N/A;  . DIALYSIS/PERMA CATHETER REMOVAL N/A 12/13/2019   Procedure: DIALYSIS/PERMA CATHETER REMOVAL;  Surgeon: Katha Cabal, MD;  Location: [facility];  Service: Cardiovascular;  Laterality: N/A;    Prior to Admission medications   Medication Sig Start Date End Date Taking? Authorizing Provider  allopurinol (ZYLOPRIM) 100 MG tablet Take 100 mg by mouth daily.   Yes [provider]  apixaban (ELIQUIS) 5 MG TABS tablet Take 1 tablet (5 mg total) by  mouth 2 (two) times daily. For 1 week and then take 1 tablet / 5 mg twice daily for 3 more weeks. Patient taking differently: Take 5 mg by mouth 2 (two) times daily.  09/20/19  Adele Barthel, MD  midodrine (PROAMATINE) 5 MG tablet Take 5 mg of midodrine on dialysis days (Tuesday, Thursday, and Saturday) 08/11/19  Yes Elodia Florence., MD  Multiple Vitamin Artis Flock) TABS Take 1 tablet by mouth daily.   Yes [provider]  Rosuvastatin Calcium 20 MG CPSP Take 10 mg by mouth daily.    Yes [provider]  tamsulosin (FLOMAX) 0.4 MG CAPS capsule Take 0.4 mg by mouth daily.   Yes [provider]  Na Sulfate-K Sulfate-Mg Sulf 17.5-3.13-1.6 GM/177ML SOLN At 5 PM the day before procedure take 1 bottle and 5 hours before procedure take 1 bottle. 04/24/20   Virgel Manifold, MD    Family History  Problem Relation Age of Onset  . Heart disease Father      Social History   Tobacco Use  . Smoking status: Former Smoker    Packs/day: 0.00    Years: 5.00    Pack years: 0.00    Quit date: 08/06/1989    Years since quitting: 30.7  . Smokeless tobacco: Never Used  Vaping Use  . Vaping Use: Never used  Substance Use Topics  . Alcohol use: Never  . Drug use: Never    Allergies as of 04/24/2020 - Review Complete 04/24/2020  Allergen Reaction Noted  . Heparin  08/27/2019  . Amoxicillin-pot clavulanate Rash 06/24/2016    Review of Systems:    All systems reviewed and negative except where noted in HPI.   Physical Exam:  BP 130/67   Pulse 80   Temp 98.1 F (36.7 C) (Oral)   Ht 5\' 8"  (1.727 m)   Wt 265 lb (120.2 kg)   BMI 40.29 kg/m  No LMP for male patient. Psych:  Alert and cooperative. Normal mood and affect. General:   Alert,  Well-developed, well-nourished, pleasant and cooperative in NAD Head:  Normocephalic and atraumatic. Eyes:  Sclera clear, no icterus.   Conjunctiva pink. Ears:  Normal auditory acuity. Nose:  No deformity, discharge, or  lesions. Mouth:  No deformity or lesions,oropharynx pink & moist. Neck:  Supple; no masses or thyromegaly. Abdomen:  Normal bowel sounds.  No bruits.  Soft, non-tender and non-distended without masses, hepatosplenomegaly or hernias noted.  No guarding or rebound tenderness.    Msk:  Symmetrical without gross deformities. Good, equal movement & strength bilaterally. Pulses:  Normal pulses noted. Extremities:  No clubbing or edema.  No cyanosis. Neurologic:  Alert and oriented x3;  grossly normal neurologically. Skin:  Intact without significant lesions or rashes. No jaundice. Lymph Nodes:  No significant cervical adenopathy. Psych:  Alert and cooperative. Normal mood and affect.   Labs: CBC    Component Value Date/Time   WBC 7.6 10/27/2019 0831   RBC 4.16 (L) 10/27/2019 0831   HGB 11.6 (L) 10/27/2019 0831   HCT 36.4 (L) 10/27/2019 0831   HCT 23.4 (L) 08/24/2019 1358   PLT 271 10/27/2019 0831   MCV 87.5 10/27/2019 0831   MCH 27.9 10/27/2019 0831   MCHC 31.9 10/27/2019 0831   RDW 14.1 10/27/2019 0831   LYMPHSABS 3.1 10/27/2019 0831   MONOABS 0.5 10/27/2019 0831   EOSABS 0.6 (H) 10/27/2019 0831   BASOSABS 0.1 10/27/2019 0831   CMP     Component Value Date/Time   NA 135 08/27/2019 0816   K 4.1 08/27/2019 0816   CL 98 08/27/2019 0816   CO2 19 (L) 08/27/2019 0816   GLUCOSE 177 (H) 08/27/2019 0816   BUN 100 (H) 08/27/2019 0816   CREATININE 6.54 (H) 08/27/2019 0816   CALCIUM 7.9 (L) 08/27/2019 0816   PROT 6.2 (L) 08/27/2019 0816   ALBUMIN 2.8 (L) 08/27/2019 0816   AST 22 08/27/2019 0816   ALT 29 08/27/2019 0816   ALKPHOS 58 08/27/2019 0816   BILITOT 0.4 08/27/2019 0816   GFRNONAA 8 (L) 08/27/2019 0816   GFRAA 10 (L) 08/27/2019 0816    Imaging Studies: No results found.  Assessment and Plan:   Peter Pearson is a 64 y.o. y/o male has been referred for preop colonoscopy prior to kidney transplant  Patient due for screening colonoscopy since he has not had one in 15  years  This would also allow for completion of his pretransplant work-up from GI standpoint  No alarm symptoms present  We will send Eliquis clearance request to Dr. Holley Raring  I have discussed alternative options, risks & benefits,  which include, but are not limited to, bleeding, infection, perforation,respiratory complication & drug reaction.  The patient agrees with this plan & written consent will be obtained.     Dr Peter Pearson  Speech recognition software was used to dictate the above note.

## 2020-04-24 NOTE — Telephone Encounter (Signed)
Blood thinner clearance was faxed to Dr. Elwyn Lade office to let us know when patient should hold his Eliquis. Awaiting on a response. Colonoscopy: 05/17/2020

## 2020-04-25 ENCOUNTER — Telehealth: Payer: Self-pay

## 2020-04-25 NOTE — Telephone Encounter (Signed)
Returned patients call. Patient wanted to verify procedure date. Pt verbalized understanding.

## 2020-04-27 ENCOUNTER — Other Ambulatory Visit: Payer: Self-pay | Admitting: Oncology

## 2020-04-30 NOTE — Telephone Encounter (Signed)
Called patient to notify him that Dr. Holley Raring wanted him to hold his Eliquis 2 days prior and restart 2 days post procedure. Patient understood and had no further questions.

## 2020-05-01 ENCOUNTER — Other Ambulatory Visit (INDEPENDENT_AMBULATORY_CARE_PROVIDER_SITE_OTHER): Payer: Self-pay | Admitting: Nurse Practitioner

## 2020-05-01 DIAGNOSIS — N186 End stage renal disease: Secondary | ICD-10-CM

## 2020-05-03 ENCOUNTER — Ambulatory Visit (INDEPENDENT_AMBULATORY_CARE_PROVIDER_SITE_OTHER): Payer: Medicare Other

## 2020-05-03 ENCOUNTER — Other Ambulatory Visit: Payer: Self-pay

## 2020-05-03 ENCOUNTER — Ambulatory Visit (INDEPENDENT_AMBULATORY_CARE_PROVIDER_SITE_OTHER): Payer: Medicare Other | Admitting: Nurse Practitioner

## 2020-05-03 ENCOUNTER — Encounter (INDEPENDENT_AMBULATORY_CARE_PROVIDER_SITE_OTHER): Payer: Self-pay | Admitting: Nurse Practitioner

## 2020-05-03 VITALS — BP 144/77 | HR 92 | Ht 68.0 in | Wt 260.0 lb

## 2020-05-03 DIAGNOSIS — N186 End stage renal disease: Secondary | ICD-10-CM | POA: Diagnosis not present

## 2020-05-03 DIAGNOSIS — I10 Essential (primary) hypertension: Secondary | ICD-10-CM | POA: Diagnosis not present

## 2020-05-03 DIAGNOSIS — Z992 Dependence on renal dialysis: Secondary | ICD-10-CM

## 2020-05-03 DIAGNOSIS — E119 Type 2 diabetes mellitus without complications: Secondary | ICD-10-CM

## 2020-05-07 ENCOUNTER — Encounter (INDEPENDENT_AMBULATORY_CARE_PROVIDER_SITE_OTHER): Payer: Self-pay | Admitting: Nurse Practitioner

## 2020-05-07 NOTE — Progress Notes (Signed)
Subjective:    Patient ID: Peter Pearson, male    DOB: 08-04-1955, 64 y.o.   MRN: [id number] Chief Complaint  Patient presents with  . Follow-up    6 mo U/S follow up    The patient returns to the office for followup of their dialysis access. The function of the access has been stable. The patient denies increased bleeding time or increased recirculation. Patient denies difficulty with cannulation. The patient denies hand pain or other symptoms consistent with steal phenomena.  No significant arm swelling.  The patient denies redness or swelling at the access site. The patient denies fever or chills at home or while on dialysis.  The patient denies amaurosis fugax or recent TIA symptoms. There are no recent neurological changes noted. The patient denies claudication symptoms or rest pain symptoms. The patient denies history of DVT, PE or superficial thrombophlebitis. The patient denies recent episodes of angina or shortness of breath.   The patient has a flow volume of 2585.  The brachiocephalic AV fistula has no evidence of significant stenosis.  The fistula was created on 08/10/2019.   Review of Systems  Respiratory: Negative for shortness of breath.   Hematological: Does not bruise/bleed easily.  All other systems reviewed and are negative.      Objective:   Physical Exam Vitals reviewed.  HENT:     Head: Normocephalic.  Cardiovascular:     Rate and Rhythm: Normal rate.     Pulses: Normal pulses.     Arteriovenous access: left arteriovenous access is present.    Comments: Left brachiocephalic AV fistula with good thrill and bruit Pulmonary:     Effort: Pulmonary effort is normal.  Neurological:     Mental Status: He is alert and oriented to person, place, and time.  Psychiatric:        Mood and Affect: Mood normal.        Behavior: Behavior normal.        Thought Content: Thought content normal.        Judgment: Judgment normal.     BP (!) 144/77   Pulse 92    Ht 5\' 8"  (1.727 m)   Wt 260 lb (117.9 kg)   BMI 39.53 kg/m   Past Medical History:  Diagnosis Date  . Chronic kidney disease   . Hyperlipidemia   . Hypertension   . Sleep apnea     Social History   Socioeconomic History  . Marital status: Single    Spouse name: Shamim  . Number of children: 3  . Years of education: Not on file  . Highest education level: Not on file  Occupational History  . Occupation: [employer]   Tobacco Use  . Smoking status: Former Smoker    Packs/day: 0.00    Years: 5.00    Pack years: 0.00    Quit date: 08/06/1989    Years since quitting: 30.7  . Smokeless tobacco: Never Used  Vaping Use  . Vaping Use: Never used  Substance and Sexual Activity  . Alcohol use: Never  . Drug use: Never  . Sexual activity: Not on file  Other Topics Concern  . Not on file  Social History Narrative  . Not on file   Social Determinants of Health   Financial Resource Strain:   . Difficulty of Paying Living Expenses: Not on file  Food Insecurity:   . Worried About Charity fundraiser in the Last Year: Not on file  . Ran  Out of Food in the Last Year: Not on file  Transportation Needs:   . Lack of Transportation (Medical): Not on file  . Lack of Transportation (Non-Medical): Not on file  Physical Activity:   . Days of Exercise per Week: Not on file  . Minutes of Exercise per Session: Not on file  Stress:   . Feeling of Stress : Not on file  Social Connections:   . Frequency of Communication with Friends and Family: Not on file  . Frequency of Social Gatherings with Friends and Family: Not on file  . Attends Religious Services: Not on file  . Active Member of Clubs or Organizations: Not on file  . Attends Archivist Meetings: Not on file  . Marital Status: Not on file  Intimate Partner Violence:   . Fear of Current or Ex-Partner: Not on file  . Emotionally Abused: Not on file  . Physically Abused: Not on file  . Sexually Abused: Not on  file    Past Surgical History:  Procedure Laterality Date  . AV FISTULA PLACEMENT Left 08/10/2019   Procedure: Left Upper Extremity AV Fistula creation;  Surgeon: Algernon Huxley, MD;  Location: [facility];  Service: Vascular;  Laterality: Left;  . DIALYSIS/PERMA CATHETER INSERTION N/A 08/08/2019   Procedure: DIALYSIS/PERMA CATHETER INSERTION;  Surgeon: Algernon Huxley, MD;  Location: [facility];  Service: Cardiovascular;  Laterality: N/A;  . DIALYSIS/PERMA CATHETER REMOVAL N/A 12/13/2019   Procedure: DIALYSIS/PERMA CATHETER REMOVAL;  Surgeon: Katha Cabal, MD;  Location: [facility];  Service: Cardiovascular;  Laterality: N/A;    Family History  Problem Relation Age of Onset  . Heart disease Father     Allergies  Allergen Reactions  . 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 Amoxicillin-Pot Clavulanate Rash    CBC Latest Ref Rng & Units 10/27/2019 09/20/2019 09/12/2019  WBC 4.0 - 10.5 K/uL 7.6 5.5 5.5  Hemoglobin 13.0 - 17.0 g/dL 11.6(L) 8.6(L) 7.6(L)  Hematocrit 39 - 52 % 36.4(L) 27.1(L) 24.3(L)  Platelets 150 - 400 K/uL 271 187 67(L)      CMP     Component Value Date/Time   NA 135 08/27/2019 0816   K 4.1 08/27/2019 0816   CL 98 08/27/2019 0816   CO2 19 (L) 08/27/2019 0816   GLUCOSE 177 (H) 08/27/2019 0816   BUN 100 (H) 08/27/2019 0816   CREATININE 6.54 (H) 08/27/2019 0816   CALCIUM 7.9 (L) 08/27/2019 0816   PROT 6.2 (L) 08/27/2019 0816   ALBUMIN 2.8 (L) 08/27/2019 0816   AST 22 08/27/2019 0816   ALT 29 08/27/2019 0816   ALKPHOS 58 08/27/2019 0816   BILITOT 0.4 08/27/2019 0816   GFRNONAA 8 (L) 08/27/2019 0816   GFRAA 10 (L) 08/27/2019 0816     No results found.     Assessment & Plan:   1. ESRD (end stage renal disease) on dialysis [facility]) Recommend:  The patient is doing well and currently has adequate dialysis  access. The patient's dialysis center is not reporting any access issues. Flow pattern is stable when compared to the prior ultrasound.  The patient should have a duplex ultrasound of the dialysis access in 6 months.  If flow access remains stable we will transition to annual follow-ups. The patient will follow-up with me in the office after each ultrasound  2. Essential hypertension Continue antihypertensive medications as already ordered, these medications have been reviewed and there are no changes at this time.   3. Type 2 diabetes mellitus without complication, without long-term current use of insulin (HCC) Continue hypoglycemic medications as already ordered, these medications have been reviewed and there are no changes at this time.  Hgb A1C to be monitored as already arranged by primary service    Current Outpatient Medications on File Prior to Visit  Medication Sig Dispense Refill  . allopurinol (ZYLOPRIM) 100 MG tablet Take 100 mg by mouth daily.    Marland Kitchen apixaban (ELIQUIS) 5 MG TABS tablet Take 1 tablet (5 mg total) by mouth 2 (two) times daily. For 1 week and then take 1 tablet / 5 mg twice daily for 3 more weeks. (Patient taking differently: Take 5 mg by mouth 2 (two) times daily. ) 60 tablet 0  . midodrine (PROAMATINE) 5 MG tablet Take 5 mg of midodrine on dialysis days (Tuesday, Thursday, and Saturday) 20 tablet 0  . Multiple Vitamin (QUINTABS) TABS Take 1 tablet by mouth daily.    . Na Sulfate-K Sulfate-Mg Sulf 17.5-3.13-1.6 GM/177ML SOLN At 5 PM the day before procedure take 1 bottle and 5 hours before procedure take 1 bottle. 354 mL 0  . Rosuvastatin Calcium 20 MG CPSP Take 10 mg by mouth daily.     . tamsulosin (FLOMAX) 0.4 MG CAPS capsule Take 0.4 mg by mouth daily.     No current facility-administered medications on file prior to visit.    There are no Patient Instructions on file for this visit. No follow-ups on file.   Kris Hartmann, NP

## 2020-05-15 ENCOUNTER — Other Ambulatory Visit: Payer: Self-pay

## 2020-05-15 ENCOUNTER — Other Ambulatory Visit
Admission: RE | Admit: 2020-05-15 | Discharge: 2020-05-15 | Disposition: A | Discharge status: Home / Self Care | Payer: Medicare Other | Source: Ambulatory Visit | Attending: Gastroenterology | Admitting: Gastroenterology

## 2020-05-15 DIAGNOSIS — Z01812 Encounter for preprocedural laboratory examination: Secondary | ICD-10-CM | POA: Insufficient documentation

## 2020-05-15 DIAGNOSIS — Z20822 Contact with and (suspected) exposure to covid-19: Secondary | ICD-10-CM | POA: Diagnosis not present

## 2020-05-15 LAB — SARS CORONAVIRUS 2 (TAT 6-24 HRS): SARS Coronavirus 2: NEGATIVE

## 2020-05-17 ENCOUNTER — Encounter: Payer: Self-pay | Admitting: Gastroenterology

## 2020-05-17 ENCOUNTER — Ambulatory Visit
Admission: RE | Admit: 2020-05-17 | Discharge: 2020-05-17 | Disposition: A | Discharge status: Home / Self Care | Payer: Medicare Other | Attending: Gastroenterology | Admitting: Gastroenterology

## 2020-05-17 ENCOUNTER — Other Ambulatory Visit: Payer: Self-pay

## 2020-05-17 ENCOUNTER — Encounter
Admission: RE | Disposition: A | Discharge status: Home / Self Care | Payer: Self-pay | Source: Home / Self Care | Attending: Gastroenterology

## 2020-05-17 ENCOUNTER — Ambulatory Visit: Payer: Medicare Other | Admitting: Registered Nurse

## 2020-05-17 DIAGNOSIS — Z79899 Other long term (current) drug therapy: Secondary | ICD-10-CM | POA: Insufficient documentation

## 2020-05-17 DIAGNOSIS — Z880 Allergy status to penicillin: Secondary | ICD-10-CM | POA: Insufficient documentation

## 2020-05-17 DIAGNOSIS — Z1211 Encounter for screening for malignant neoplasm of colon: Secondary | ICD-10-CM

## 2020-05-17 DIAGNOSIS — D124 Benign neoplasm of descending colon: Secondary | ICD-10-CM | POA: Insufficient documentation

## 2020-05-17 DIAGNOSIS — K649 Unspecified hemorrhoids: Secondary | ICD-10-CM | POA: Insufficient documentation

## 2020-05-17 DIAGNOSIS — Z888 Allergy status to other drugs, medicaments and biological substances status: Secondary | ICD-10-CM | POA: Diagnosis not present

## 2020-05-17 DIAGNOSIS — Z7901 Long term (current) use of anticoagulants: Secondary | ICD-10-CM | POA: Diagnosis not present

## 2020-05-17 DIAGNOSIS — Z87891 Personal history of nicotine dependence: Secondary | ICD-10-CM | POA: Insufficient documentation

## 2020-05-17 DIAGNOSIS — K635 Polyp of colon: Secondary | ICD-10-CM

## 2020-05-17 DIAGNOSIS — D125 Benign neoplasm of sigmoid colon: Secondary | ICD-10-CM | POA: Diagnosis not present

## 2020-05-17 HISTORY — DX: Dependence on renal dialysis: Z99.2

## 2020-05-17 HISTORY — DX: Long term (current) use of anticoagulants: Z79.01

## 2020-05-17 HISTORY — DX: Anemia, unspecified: D64.9

## 2020-05-17 HISTORY — DX: Gout, unspecified: M10.9

## 2020-05-17 HISTORY — PX: COLONOSCOPY WITH PROPOFOL: SHX5780

## 2020-05-17 SURGERY — COLONOSCOPY WITH PROPOFOL
Anesthesia: General

## 2020-05-17 MED ORDER — PROPOFOL 500 MG/50ML IV EMUL
INTRAVENOUS | Status: DC | PRN
  Administered 2020-05-17: 150 ug/kg/min via INTRAVENOUS

## 2020-05-17 MED ORDER — SODIUM CHLORIDE 0.9 % IV SOLN: INTRAVENOUS | Status: DC

## 2020-05-17 MED ORDER — LIDOCAINE HCL (PF) 2 % IJ SOLN
INTRAMUSCULAR | Status: AC
  Filled 2020-05-17: qty 5

## 2020-05-17 MED ORDER — PROPOFOL 10 MG/ML IV BOLUS
INTRAVENOUS | Status: DC | PRN
  Administered 2020-05-17: 60 mg via INTRAVENOUS
  Administered 2020-05-17: 30 mg via INTRAVENOUS
  Administered 2020-05-17: 20 mg via INTRAVENOUS

## 2020-05-17 MED ORDER — LIDOCAINE HCL (CARDIAC) PF 100 MG/5ML IV SOSY
PREFILLED_SYRINGE | INTRAVENOUS | Status: DC | PRN
  Administered 2020-05-17: 100 mg via INTRAVENOUS

## 2020-05-17 MED ORDER — PROPOFOL 500 MG/50ML IV EMUL
INTRAVENOUS | Status: AC
  Filled 2020-05-17: qty 50

## 2020-05-17 MED ORDER — PROPOFOL 500 MG/50ML IV EMUL
INTRAVENOUS | Status: AC
  Filled 2020-05-17: qty 100

## 2020-05-17 MED ORDER — PHENYLEPHRINE HCL (PRESSORS) 10 MG/ML IV SOLN
INTRAVENOUS | Status: DC | PRN
  Administered 2020-05-17: 150 ug via INTRAVENOUS
  Administered 2020-05-17: 100 ug via INTRAVENOUS

## 2020-05-17 MED ORDER — EPHEDRINE SULFATE 50 MG/ML IJ SOLN
INTRAMUSCULAR | Status: DC | PRN
  Administered 2020-05-17 (×3): 10 mg via INTRAVENOUS
  Administered 2020-05-17: 5 mg via INTRAVENOUS
  Administered 2020-05-17: 10 mg via INTRAVENOUS

## 2020-05-17 NOTE — H&P (Signed)
Vonda Antigua, MD [address] [place], [place], [place], [address] [address], [place], [place], [place], [address] Phone: [phone]  Fax: [phone]  Primary Care Physician:  Debbrah Alar, PA-C   Pre-Procedure History & Physical: HPI:  Peter Pearson is a 64 y.o. male is here for a colonoscopy.   Past Medical History:  Diagnosis Date  . Anemia   . Chronic kidney disease   . Gout   . Hemodialysis patient ([place])   . Hyperlipidemia   . Hypertension   . Sleep apnea     Past Surgical History:  Procedure Laterality Date  . AV FISTULA PLACEMENT Left 08/10/2019   Procedure: Left Upper Extremity AV Fistula creation;  Surgeon: Algernon Huxley, MD;  Location: [facility];  Service: Vascular;  Laterality: Left;  . COLONOSCOPY    . DIALYSIS/PERMA CATHETER INSERTION N/A 08/08/2019   Procedure: DIALYSIS/PERMA CATHETER INSERTION;  Surgeon: Algernon Huxley, MD;  Location: [facility];  Service: Cardiovascular;  Laterality: N/A;  . DIALYSIS/PERMA CATHETER REMOVAL N/A 12/13/2019   Procedure: DIALYSIS/PERMA CATHETER REMOVAL;  Surgeon: Katha Cabal, MD;  Location: [facility];  Service: Cardiovascular;  Laterality: N/A;    Prior to Admission medications   Medication Sig Start Date End Date Taking? Authorizing Provider  allopurinol (ZYLOPRIM) 100 MG tablet Take 100 mg by mouth daily.   Yes [provider]  midodrine (PROAMATINE) 5 MG tablet Take 5 mg of midodrine on dialysis days (Tuesday, Thursday, and Saturday) 08/11/19  Yes Elodia Florence., MD  Multiple Vitamin Artis Flock) TABS Take 1 tablet by mouth daily.   Yes [provider]  Rosuvastatin Calcium 20 MG CPSP Take 10 mg by mouth daily.    Yes [provider]  tamsulosin (FLOMAX) 0.4 MG CAPS capsule Take 0.4 mg by mouth daily.   Yes [provider]  apixaban (ELIQUIS) 5 MG TABS tablet Take 1 tablet (5 mg total) by mouth 2 (two) times daily. For 1 week and then take  1 tablet / 5 mg twice daily for 3 more weeks. Patient taking differently: Take 5 mg by mouth 2 (two) times daily. 09/20/19   Earlie Server, MD  Na Sulfate-K Sulfate-Mg Sulf 17.5-3.13-1.6 GM/177ML SOLN At 5 PM the day before procedure take 1 bottle and 5 hours before procedure take 1 bottle. 04/24/20   Virgel Manifold, MD    Allergies as of 04/30/2020 - Review Complete 04/24/2020  Allergen Reaction Noted  . Heparin  08/27/2019  . Amoxicillin-pot clavulanate Rash 06/24/2016    Family History  Problem Relation Age of Onset  . Heart disease Father     Social History   Socioeconomic History  . Marital status: Single    Spouse name: Shamim  . Number of children: 3  . Years of education: Not on file  . Highest education level: Not on file  Occupational History  . Occupation: [employer]   Tobacco Use  . Smoking status: Former Smoker    Packs/day: 0.00    Years: 5.00    Pack years: 0.00    Quit date: 08/06/1989    Years since quitting: 30.8  . Smokeless tobacco: Never Used  Vaping Use  . Vaping Use: Never used  Substance and Sexual Activity  . Alcohol use: Never  . Drug use: Never  . Sexual activity: Not on file  Other Topics Concern  . Not on file  Social History Narrative  . Not on file   Social  Determinants of Health   Financial Resource Strain: Not on file  Food Insecurity: Not on file  Transportation Needs: Not on file  Physical Activity: Not on file  Stress: Not on file  Social Connections: Not on file  Intimate Partner Violence: Not on file    Review of Systems: See HPI, otherwise negative ROS  Physical Exam: BP 122/72   Pulse 80   Temp 97.6 F (36.4 C) (Temporal)   Resp 20   Ht 5\' 8"  (1.727 m)   Wt 117 kg   SpO2 99%   BMI 39.23 kg/m  General:   Alert,  pleasant and cooperative in NAD Head:  Normocephalic and atraumatic. Neck:  Supple; no masses or thyromegaly. Lungs:  Clear throughout to auscultation, normal respiratory effort.     Heart:  +S1, +S2, Regular rate and rhythm, No edema. Abdomen:  Soft, nontender and nondistended. Normal bowel sounds, without guarding, and without rebound.   Neurologic:  Alert and  oriented x4;  grossly normal neurologically.  Impression/Plan: Peter Pearson is here for a colonoscopy to be performed for average risk screening.  Risks, benefits, limitations, and alternatives regarding  colonoscopy have been reviewed with the patient.  Questions have been answered.  All parties agreeable.   Virgel Manifold, MD  05/17/2020, 8:41 AM

## 2020-05-17 NOTE — Anesthesia Preprocedure Evaluation (Signed)
Anesthesia Evaluation  Patient identified by MRN, date of birth, ID band Patient awake    Reviewed: Allergy & Precautions, H&P , NPO status , Patient's Chart, lab work & pertinent test results  Airway Mallampati: IV  TM Distance: >3 FB Neck ROM: full   Comment: Mallampati view limited by patient effort with mouth opening Dental  (+) Teeth Intact   Pulmonary sleep apnea and Continuous Positive Airway Pressure Ventilation , former smoker,           Cardiovascular hypertension, (-) angina(-) Past MI and (-) Cardiac Stents (-) dysrhythmias  Rhythm:regular     Neuro/Psych negative neurological ROS  negative psych ROS   GI/Hepatic negative GI ROS, Neg liver ROS,   Endo/Other  diabetesMorbid obesity  Renal/GU ESRFRenal disease     Musculoskeletal  (+) Arthritis , Osteoarthritis,    Abdominal   Peds  Hematology  (+) Blood dyscrasia (Hgb 9.1), anemia ,   Anesthesia Other Findings Past Medical History: No date: Chronic kidney disease No date: Hyperlipidemia No date: Hypertension No date: Sleep apnea  Past Surgical History: 08/08/2019: DIALYSIS/PERMA CATHETER INSERTION; N/A     Comment:  Procedure: DIALYSIS/PERMA CATHETER INSERTION;  Surgeon:               Algernon Huxley, MD;  Location: [facility];                Service: Cardiovascular;  Laterality: N/A;  BMI    Body Mass Index: 41.97 kg/m      Reproductive/Obstetrics negative OB ROS                             Anesthesia Physical  Anesthesia Plan  ASA: III  Anesthesia Plan: General   Post-op Pain Management:    Induction:   PONV Risk Score and Plan: Propofol infusion  Airway Management Planned: Nasal Cannula  Additional Equipment:   Intra-op Plan:   Post-operative Plan:   Informed Consent: I have reviewed the patients History and Physical, chart, labs and discussed the procedure including the risks, benefits and  alternatives for the proposed anesthesia with the patient or authorized representative who has indicated his/her understanding and acceptance.     Dental Advisory Given and Dental advisory given  Plan Discussed with: Anesthesiologist and CRNA  Anesthesia Plan Comments:         Anesthesia Quick Evaluation

## 2020-05-17 NOTE — Op Note (Signed)
[facility] Gastroenterology Patient Name: Peter Pearson Procedure Date: 05/17/2020 8:45 AM MRN: [id number] Account #: [id number] Date of Birth: 1956-02-02 Admit Type: Outpatient Age: 64 Room: [facility] ENDO ROOM 2 Gender: Male Note Status: Finalized Procedure:             Colonoscopy Indications:           Screening for colorectal malignant neoplasm Providers:             Falon Flinchum B. Bonna Gains MD, MD Referring MD:          Delsa Bern, RDCS (Referring MD) Medicines:             Monitored Anesthesia Care Complications:         No immediate complications. Procedure:             Pre-Anesthesia Assessment:                        - ASA Grade Assessment: II - A patient with mild                         systemic disease.                        - Prior to the procedure, a History and Physical was                         performed, and patient medications, allergies and                         sensitivities were reviewed. The patient's tolerance                         of previous anesthesia was reviewed.                        - The risks and benefits of the procedure and the                         sedation options and risks were discussed with the                         patient. All questions were answered and informed                         consent was obtained.                        - Patient identification and proposed procedure were                         verified prior to the procedure by the physician, the                         nurse, the anesthesiologist, the anesthetist and the                         technician. The procedure was verified in the                         procedure room.  After obtaining informed consent, the colonoscope was                         passed under direct vision. Throughout the procedure,                         the patient's blood pressure, pulse, and oxygen                         saturations were monitored  continuously. The                         Colonoscope was introduced through the anus and                         advanced to the the cecum, identified by appendiceal                         orifice and ileocecal valve. The colonoscopy was                         performed with ease. The patient tolerated the                         procedure well. The quality of the bowel preparation                         was good. Findings:      Hemorrhoids were found on perianal exam.      Three sessile polyps were found in the transverse colon, ascending colon       and cecum. The polyps were 4 to 5 mm in size. These polyps were removed       with a jumbo cold forceps. Resection and retrieval were complete.      A 4 mm polyp was found in the descending colon. The polyp was sessile.       The polyp was removed with a jumbo cold forceps. Resection and retrieval       were complete.      Two sessile polyps were found in the sigmoid colon and descending colon.       The polyps were 4 to 7 mm in size. These polyps were removed with a cold       snare. Resection and retrieval were complete.      The exam was otherwise without abnormality.      The rectum, sigmoid colon, descending colon, transverse colon, ascending       colon and cecum appeared normal.      The retroflexed view of the distal rectum and anal verge was normal and       showed no anal or rectal abnormalities. Impression:            - Hemorrhoids found on perianal exam.                        - Three 4 to 5 mm polyps in the transverse colon, in                         the ascending colon and in the cecum, removed with a  jumbo cold forceps. Resected and retrieved.                        - One 4 mm polyp in the descending colon, removed with                         a jumbo cold forceps. Resected and retrieved.                        - Two 4 to 7 mm polyps in the sigmoid colon and in the                          descending colon, removed with a cold snare. Resected                         and retrieved.                        - The examination was otherwise normal.                        - The rectum, sigmoid colon, descending colon,                         transverse colon, ascending colon and cecum are normal.                        - The distal rectum and anal verge are normal on                         retroflexion view. Recommendation:        - Discharge patient to home (with escort).                        - Advance diet as tolerated.                        - Continue present medications.                        - Await pathology results.                        - Repeat colonoscopy date to be determined after                         pending pathology results are reviewed.                        - The findings and recommendations were discussed with                         the patient.                        - The findings and recommendations were discussed with                         the patient's family.                        -  Return to primary care physician as previously                         scheduled. Procedure Code(s):     --- Professional ---                        [phone], Colonoscopy, flexible; with removal of                         tumor(s), polyp(s), or other lesion(s) by snare                         technique                        45380, 36, Colonoscopy, flexible; with biopsy, single                         or multiple Diagnosis Code(s):     --- Professional ---                        K63.5, Polyp of colon                        Z12.11, Encounter for screening for malignant neoplasm                         of colon CPT copyright 2019 [employer]. All rights reserved. The codes documented in this report are preliminary and upon coder review may  be revised to meet current compliance requirements.  Vonda Antigua, MD Margretta Sidle B. Bonna Gains MD,  MD 05/17/2020 9:44:52 AM This report has been signed electronically. Number of Addenda: 0 Note Initiated On: 05/17/2020 8:45 AM Scope Withdrawal Time: 0 hours 32 minutes 42 seconds  Total Procedure Duration: 0 hours 36 minutes 19 seconds  Estimated Blood Loss:  Estimated blood loss: none.      [facility]

## 2020-05-17 NOTE — Transfer of Care (Signed)
Immediate Anesthesia Transfer of Care Note  Patient: [employer]  Procedure(s) Performed: COLONOSCOPY WITH PROPOFOL (N/A )  Patient Location: PACU and Endoscopy Unit  Anesthesia Type:General  Level of Consciousness: drowsy and patient cooperative  Airway & Oxygen Therapy: Patient Spontanous Breathing  Post-op Assessment: Report given to RN and Post -op Vital signs reviewed and stable  Post vital signs: Reviewed and stable  Last Vitals:  Vitals Value Taken Time  BP 104/50 05/17/20 0943  Temp 36.2 C 05/17/20 0941  Pulse 88 05/17/20 0944  Resp 25 05/17/20 0944  SpO2 99 % 05/17/20 0944  Vitals shown include unvalidated device data.  Last Pain:  Vitals:   05/17/20 0941  TempSrc:   PainSc: 0-No pain         Complications: No complications documented.

## 2020-05-17 NOTE — Anesthesia Postprocedure Evaluation (Signed)
Anesthesia Post Note  Patient: Peter Pearson  Procedure(s) Performed: COLONOSCOPY WITH PROPOFOL (N/A )  Patient location during evaluation: Endoscopy Anesthesia Type: General Level of consciousness: awake and alert and oriented Pain management: pain level controlled Vital Signs Assessment: post-procedure vital signs reviewed and stable Respiratory status: spontaneous breathing Cardiovascular status: blood pressure returned to baseline Anesthetic complications: no   No complications documented.   Last Vitals:  Vitals:   05/17/20 0951 05/17/20 1001  BP:  112/86  Pulse: 87 88  Resp: 19 15  Temp:    SpO2: 100% 100%    Last Pain:  Vitals:   05/17/20 1001  TempSrc:   PainSc: 0-No pain                 Lilleigh Hechavarria

## 2020-05-18 LAB — SURGICAL PATHOLOGY

## 2020-05-21 ENCOUNTER — Encounter: Payer: Self-pay | Admitting: Gastroenterology

## 2020-07-07 ENCOUNTER — Other Ambulatory Visit: Payer: Self-pay | Admitting: Oncology

## 2020-10-31 IMAGING — CR DG CHEST 2V
1 series · 2 of 2 positions shown · non-contrast
Comparison: 08/09/2019

CLINICAL DATA: Shortness of breath

EXAM:
CHEST - 2 VIEW

[Series 1: w chest pa · 0.14mm/px · 2 of 2 slices shown]
[im 1/2]
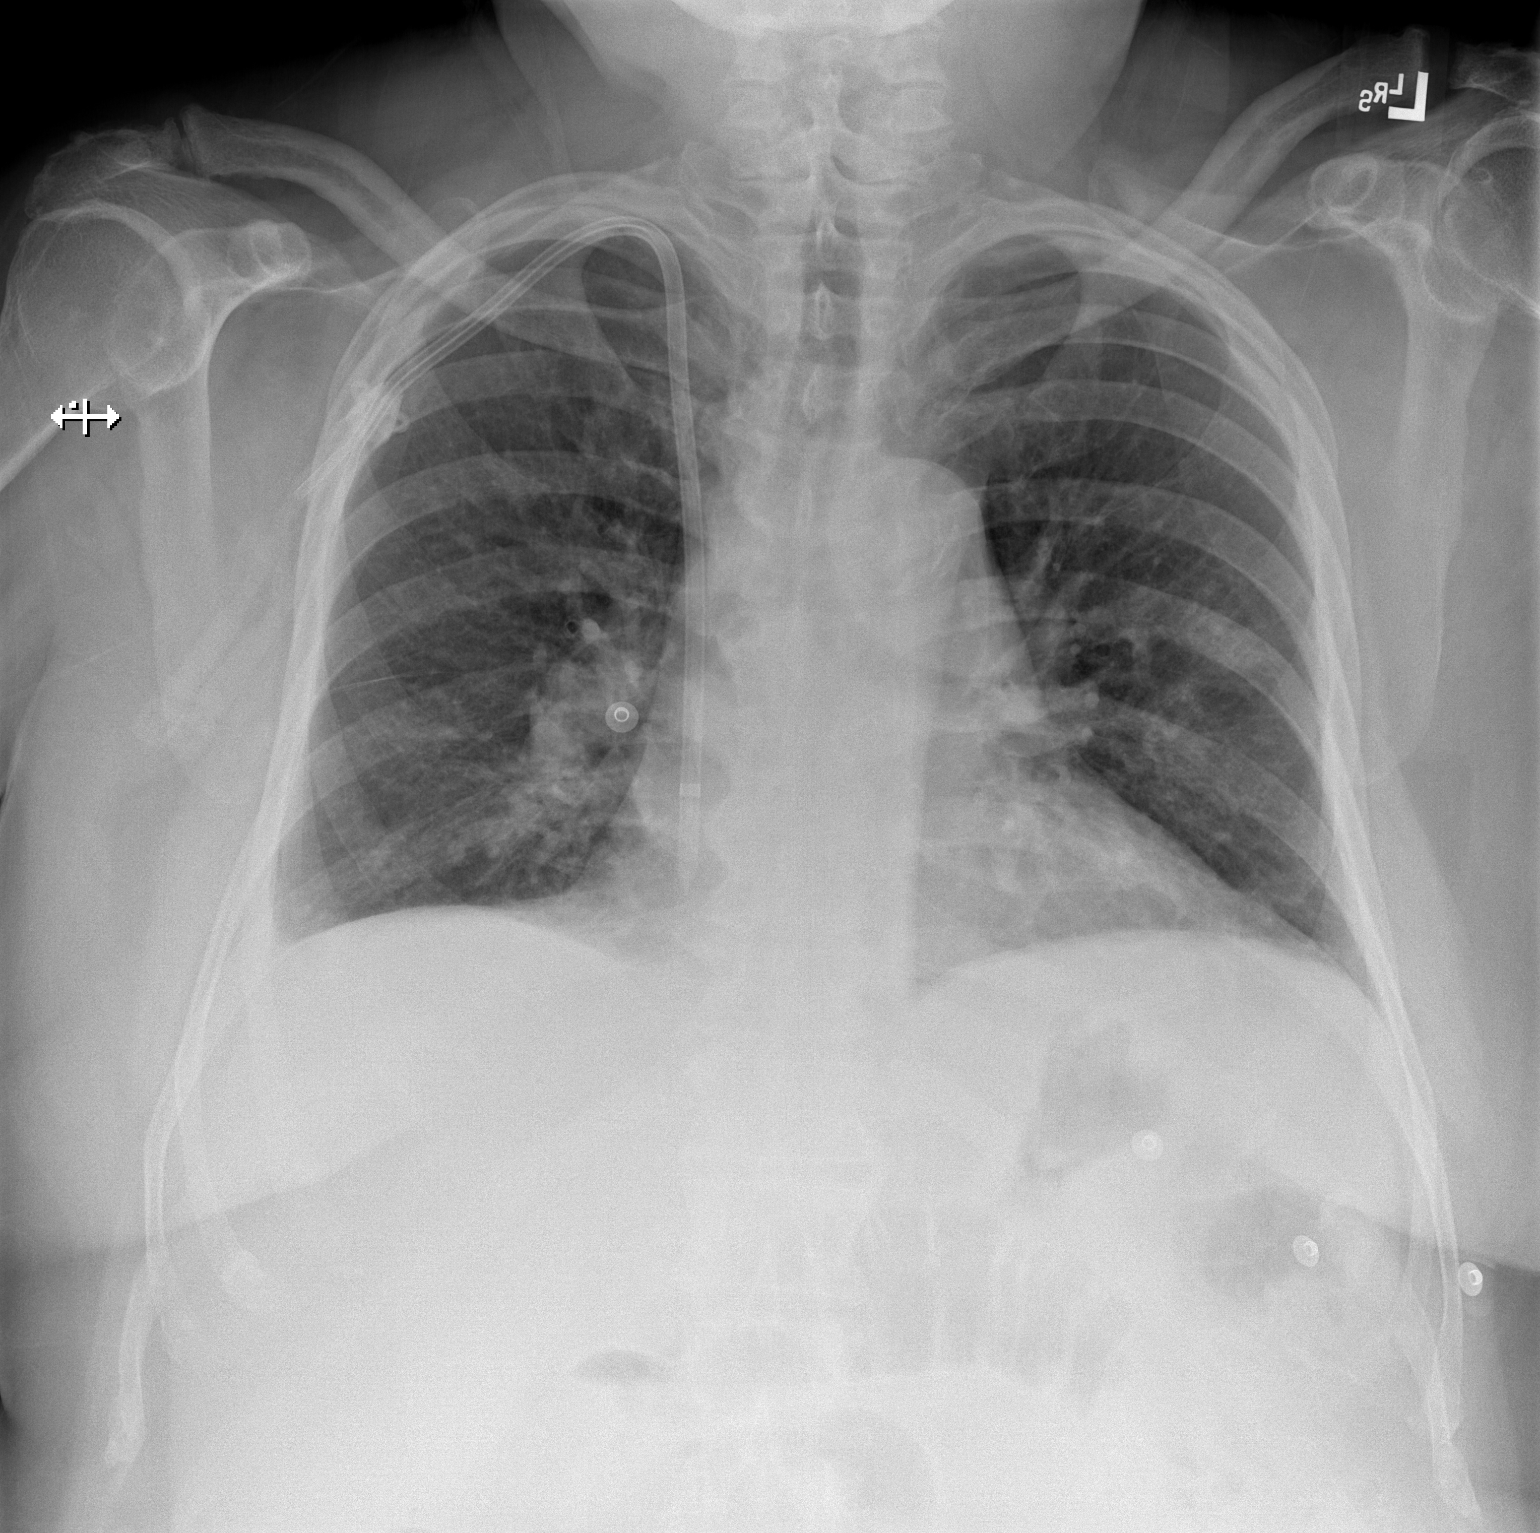
[im 2/2]
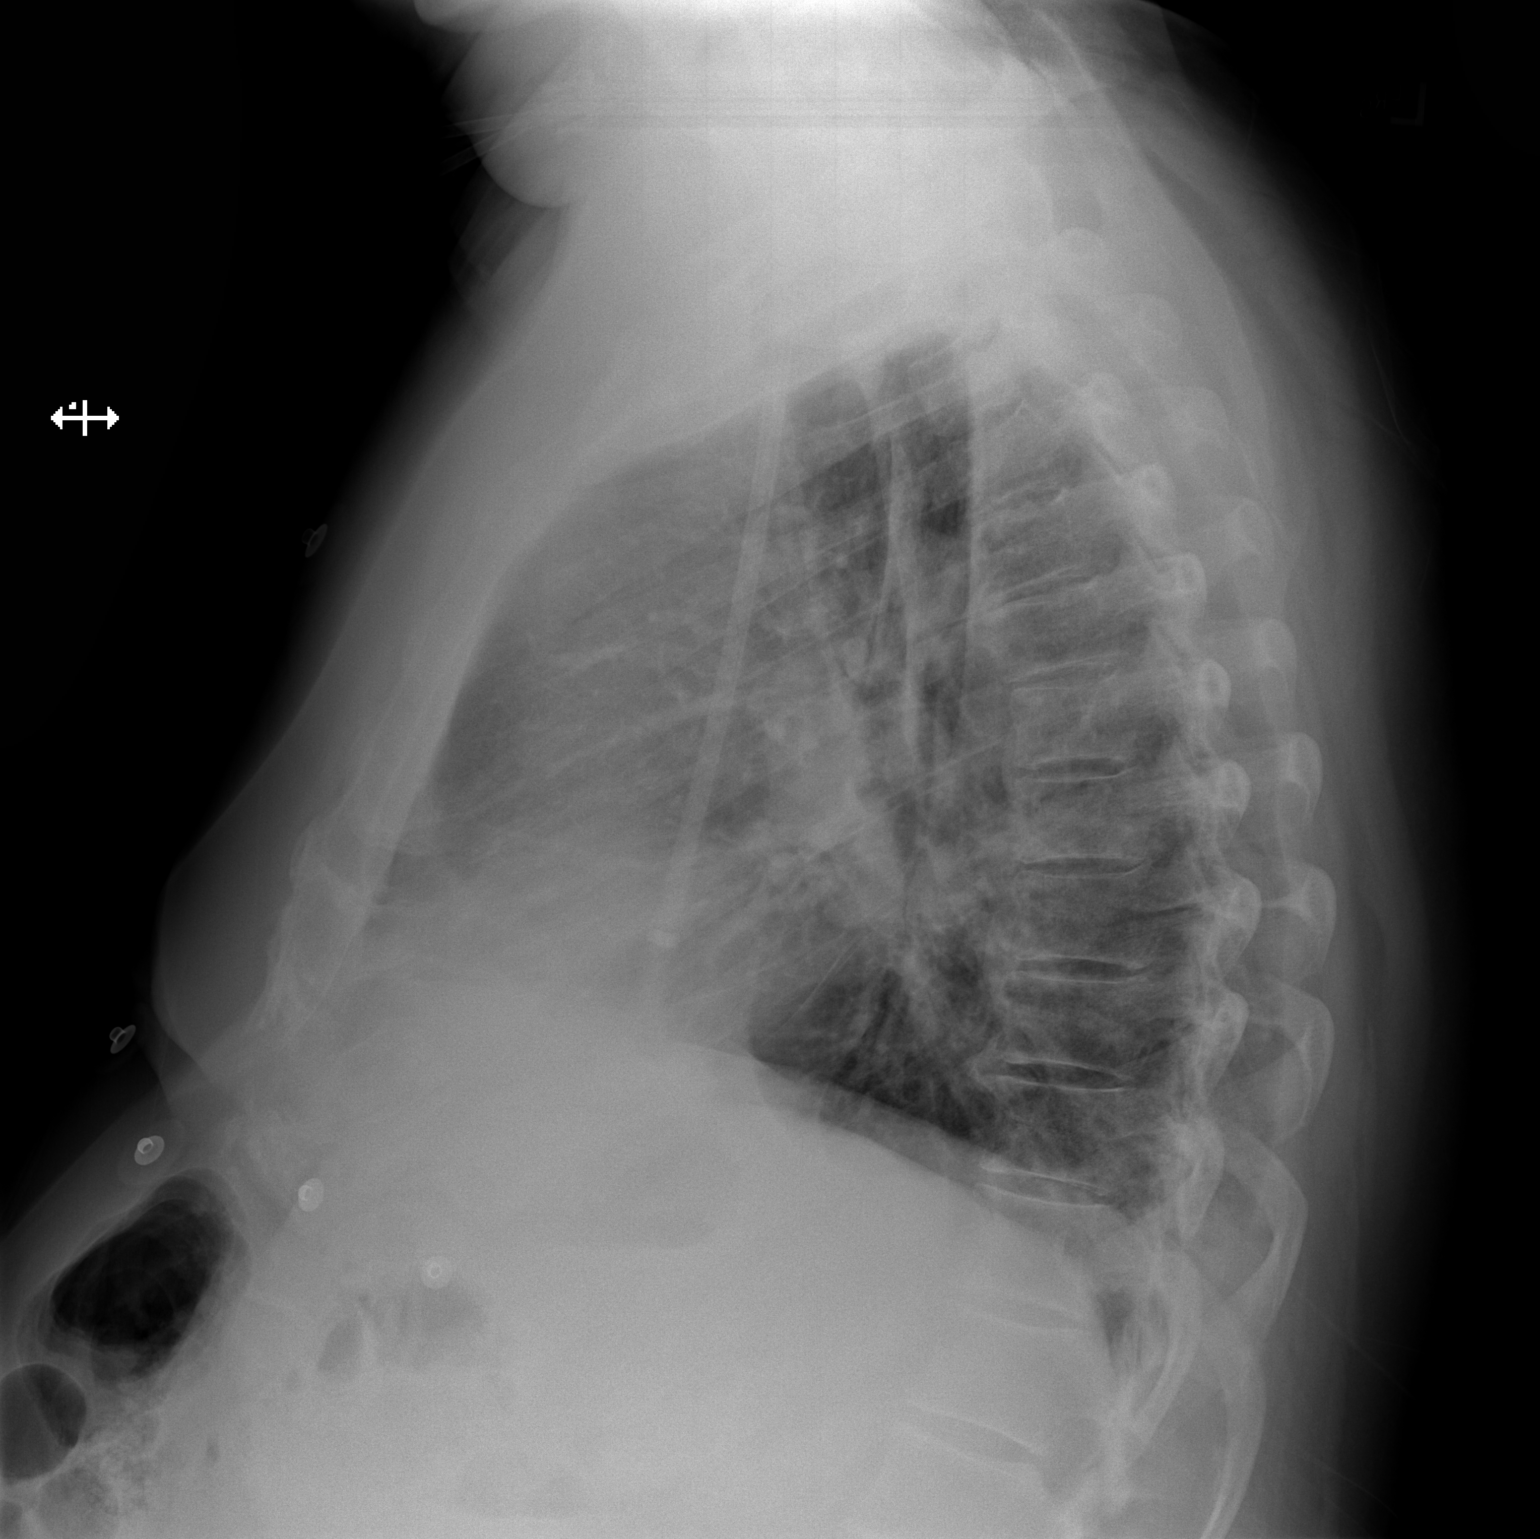

[2 of 2 positions shown; findings below may reference images not displayed]

FINDINGS: Right tunneled dialysis catheter is unchanged. No pleural effusion
or pneumothorax. Stable cardiomediastinal contours. No new
consolidation or edema. No acute osseous abnormality.
IMPRESSION: No acute process in the chest.

## 2020-11-02 ENCOUNTER — Other Ambulatory Visit (INDEPENDENT_AMBULATORY_CARE_PROVIDER_SITE_OTHER): Payer: Self-pay | Admitting: Nurse Practitioner

## 2020-11-02 DIAGNOSIS — N186 End stage renal disease: Secondary | ICD-10-CM

## 2020-11-03 IMAGING — CR DG CHEST 2V
2 series · 2 of 2 positions shown · non-contrast
Comparison: 08/23/2019

CLINICAL DATA: History of ORW06-J6 positivity with persistent cough

EXAM:
CHEST - 2 VIEW

[chest pa]
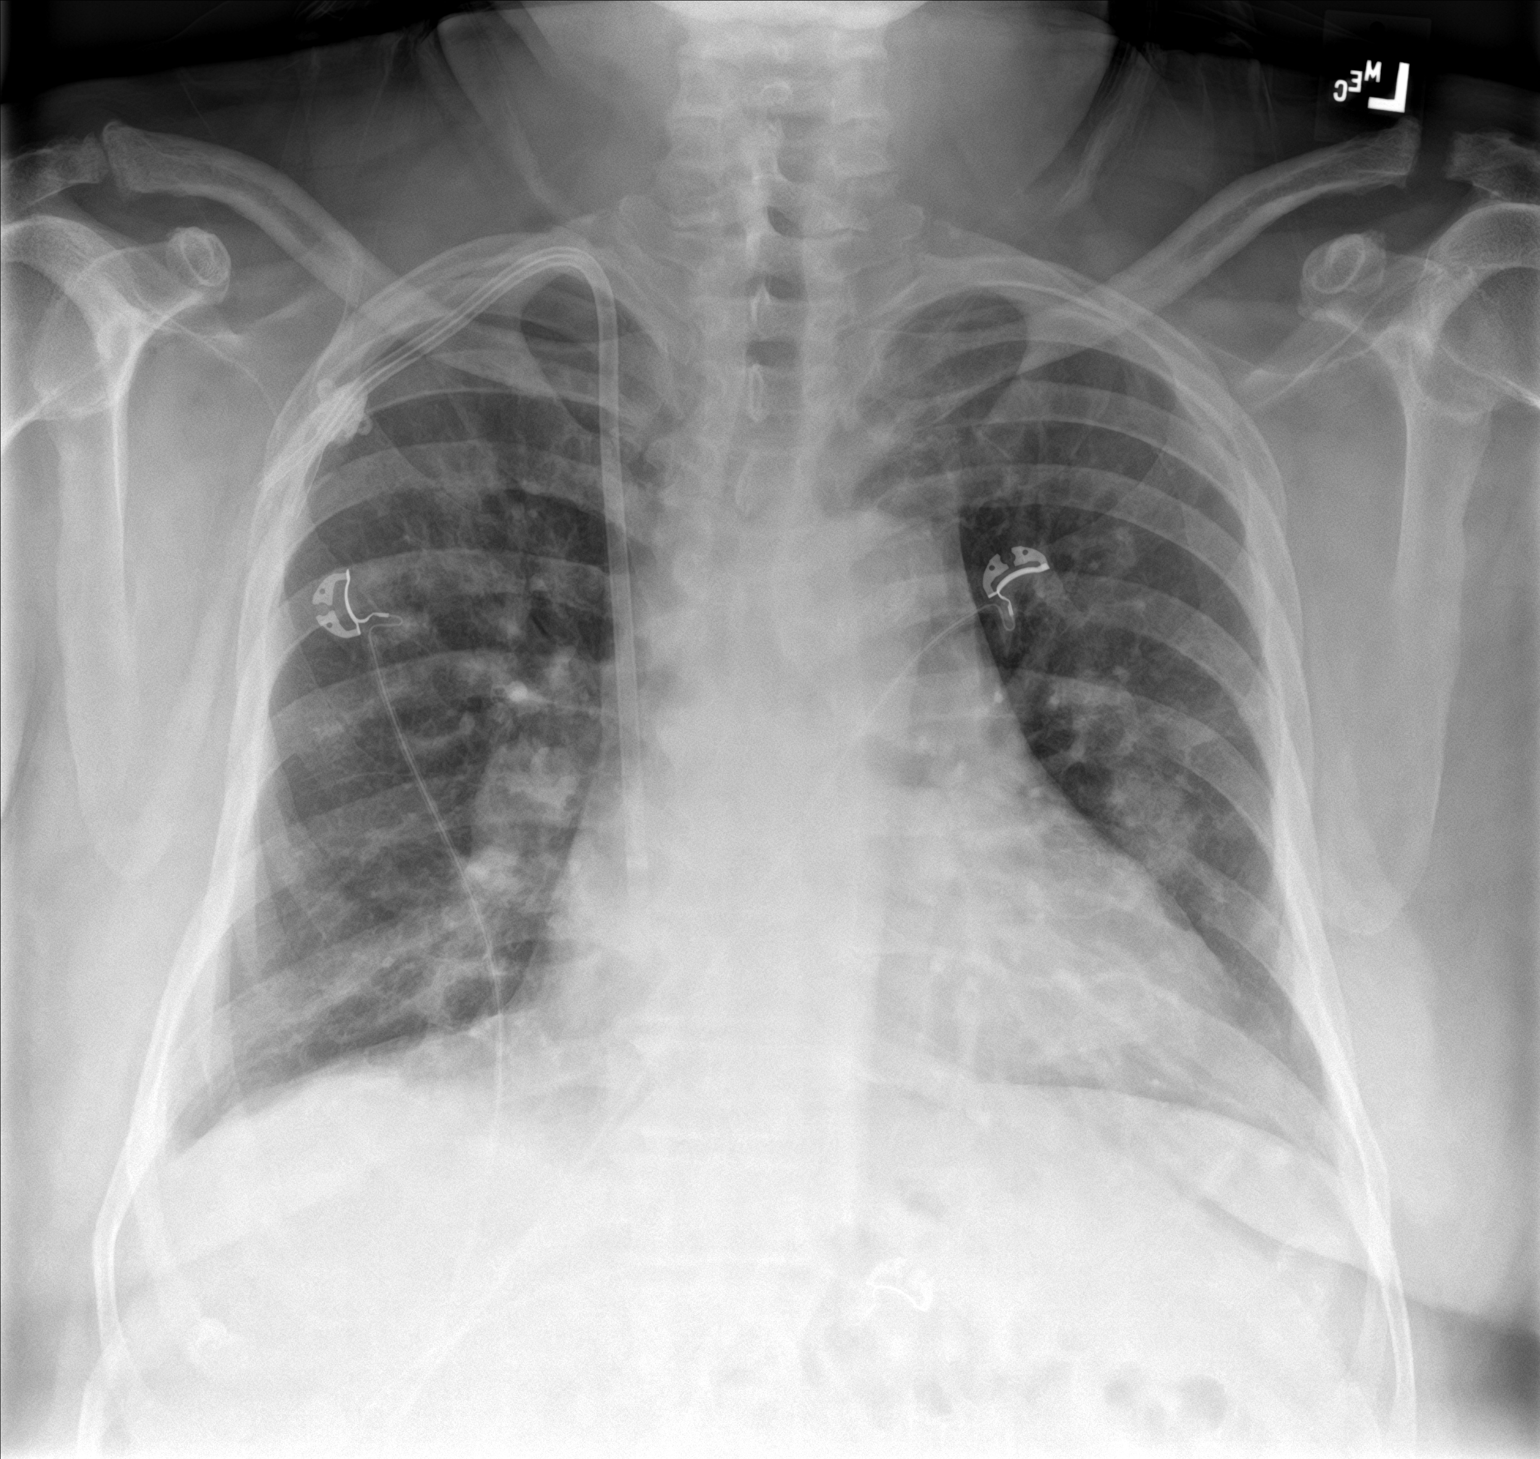

[chest lat]
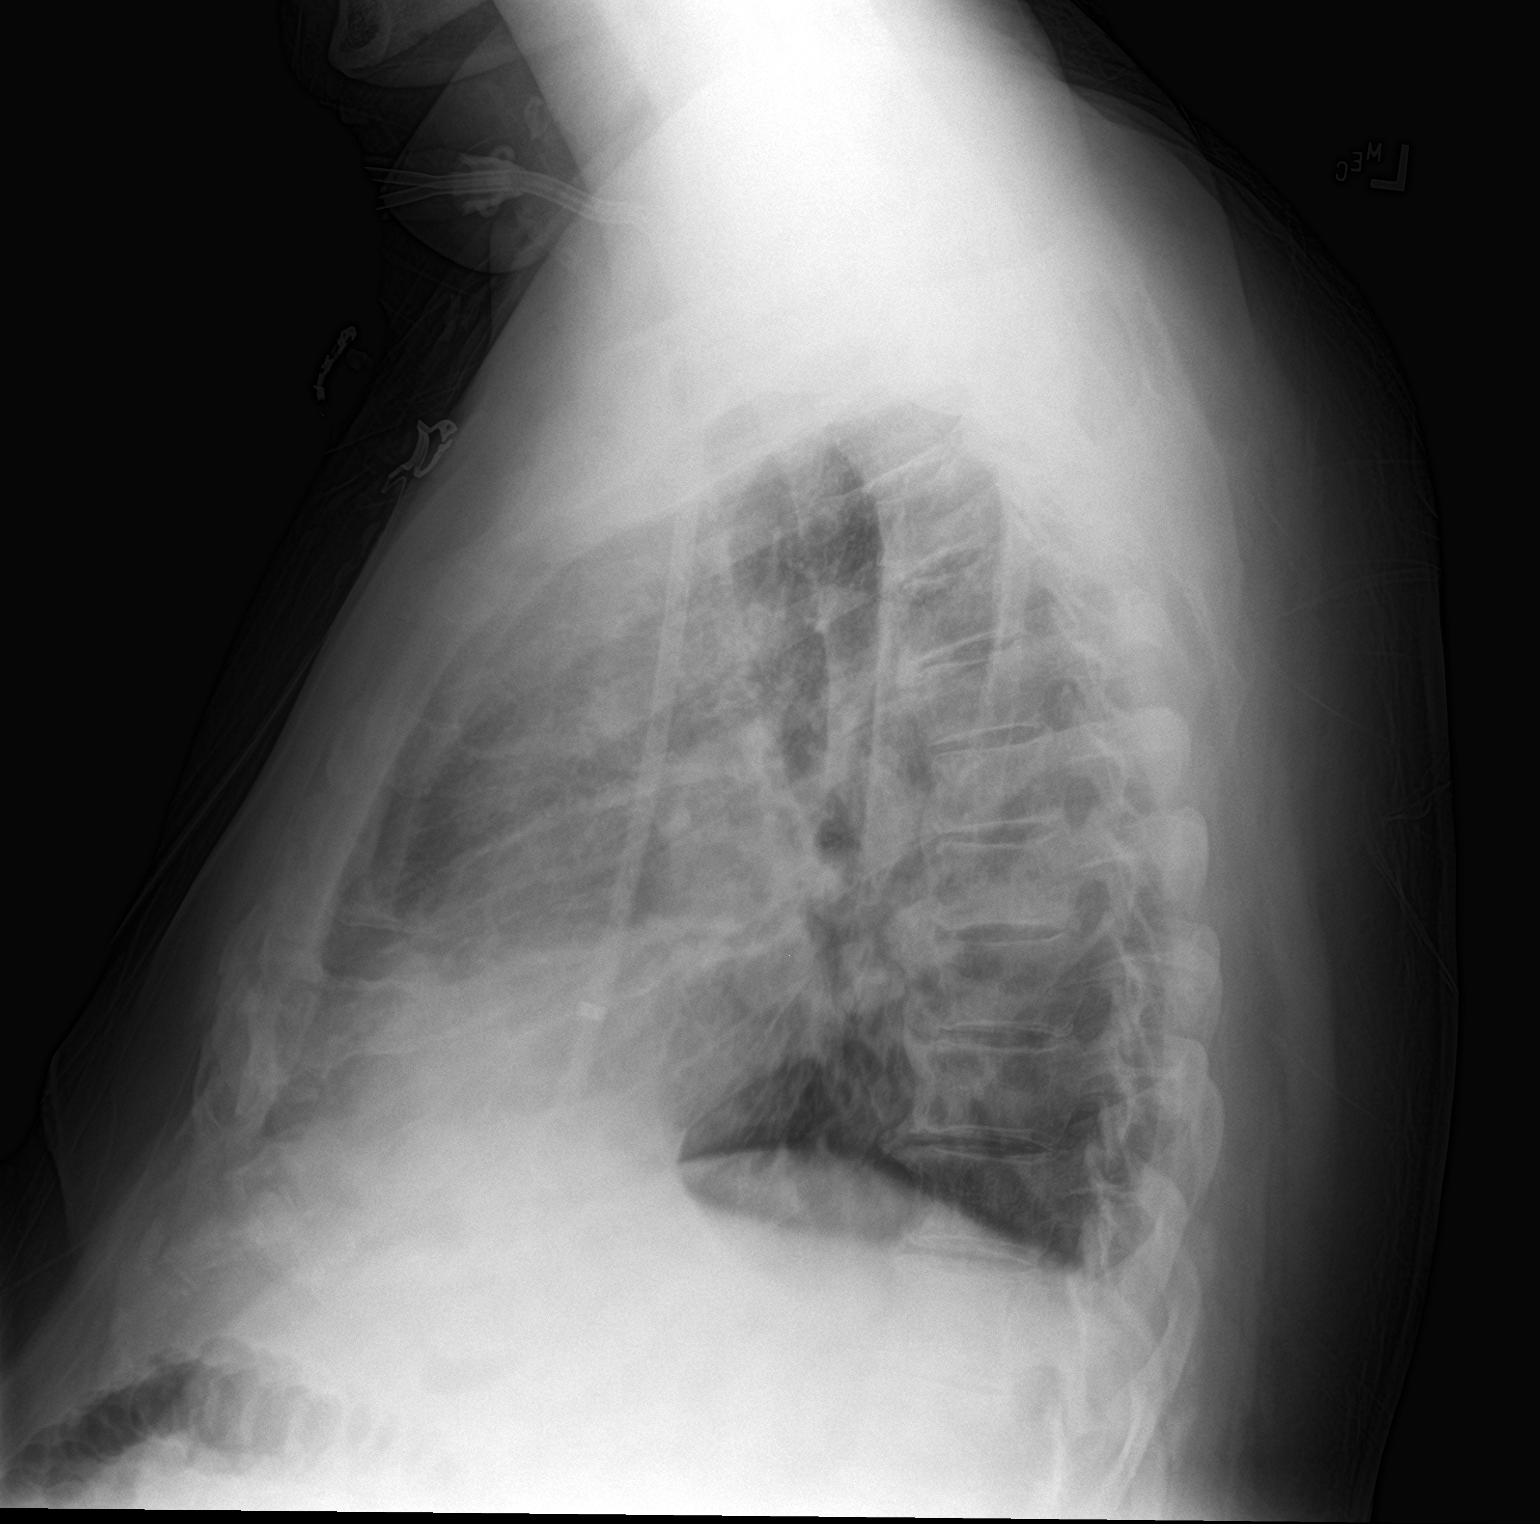

[2 of 2 positions shown; findings below may reference images not displayed]

FINDINGS: Cardiac shadow is stable. Dialysis catheter is again seen. Patchy
increased opacities are noted bilaterally consistent with the given
clinical history of ORW06-J6 positivity. No sizable effusion is
noted. No bony abnormality is seen.
IMPRESSION: Patchy opacities consistent with the given clinical history of
ORW06-J6 positivity

## 2020-11-06 ENCOUNTER — Encounter (INDEPENDENT_AMBULATORY_CARE_PROVIDER_SITE_OTHER): Payer: Medicare Other

## 2020-11-06 ENCOUNTER — Ambulatory Visit (INDEPENDENT_AMBULATORY_CARE_PROVIDER_SITE_OTHER): Payer: Medicare Other | Admitting: Vascular Surgery

## 2021-01-03 ENCOUNTER — Observation Stay
Admission: EM | Admit: 2021-01-03 | Discharge: 2021-01-04 | Disposition: A | Discharge status: Home / Self Care | Payer: Medicare Other | Attending: Internal Medicine | Admitting: Internal Medicine

## 2021-01-03 ENCOUNTER — Emergency Department: Payer: Medicare Other

## 2021-01-03 ENCOUNTER — Other Ambulatory Visit: Payer: Self-pay

## 2021-01-03 ENCOUNTER — Encounter: Payer: Self-pay | Admitting: Hospitalist

## 2021-01-03 DIAGNOSIS — R42 Dizziness and giddiness: Secondary | ICD-10-CM | POA: Diagnosis not present

## 2021-01-03 DIAGNOSIS — E1122 Type 2 diabetes mellitus with diabetic chronic kidney disease: Secondary | ICD-10-CM | POA: Diagnosis not present

## 2021-01-03 DIAGNOSIS — Z7901 Long term (current) use of anticoagulants: Secondary | ICD-10-CM | POA: Insufficient documentation

## 2021-01-03 DIAGNOSIS — Z79899 Other long term (current) drug therapy: Secondary | ICD-10-CM | POA: Diagnosis not present

## 2021-01-03 DIAGNOSIS — I120 Hypertensive chronic kidney disease with stage 5 chronic kidney disease or end stage renal disease: Secondary | ICD-10-CM | POA: Insufficient documentation

## 2021-01-03 DIAGNOSIS — I959 Hypotension, unspecified: Secondary | ICD-10-CM | POA: Diagnosis not present

## 2021-01-03 DIAGNOSIS — Z992 Dependence on renal dialysis: Secondary | ICD-10-CM | POA: Insufficient documentation

## 2021-01-03 DIAGNOSIS — R778 Other specified abnormalities of plasma proteins: Secondary | ICD-10-CM | POA: Diagnosis not present

## 2021-01-03 DIAGNOSIS — Z8616 Personal history of COVID-19: Secondary | ICD-10-CM | POA: Diagnosis not present

## 2021-01-03 DIAGNOSIS — R001 Bradycardia, unspecified: Secondary | ICD-10-CM | POA: Insufficient documentation

## 2021-01-03 DIAGNOSIS — N186 End stage renal disease: Secondary | ICD-10-CM | POA: Diagnosis not present

## 2021-01-03 DIAGNOSIS — Z87891 Personal history of nicotine dependence: Secondary | ICD-10-CM | POA: Insufficient documentation

## 2021-01-03 DIAGNOSIS — I2510 Atherosclerotic heart disease of native coronary artery without angina pectoris: Secondary | ICD-10-CM | POA: Diagnosis not present

## 2021-01-03 DIAGNOSIS — R600 Localized edema: Secondary | ICD-10-CM | POA: Insufficient documentation

## 2021-01-03 LAB — CBC WITH DIFFERENTIAL/PLATELET
Abs Immature Granulocytes: 0.04 10*3/uL (ref 0.00–0.07)
Basophils Absolute: 0.1 10*3/uL (ref 0.0–0.1)
Basophils Relative: 1 %
Eosinophils Absolute: 1 10*3/uL — ABNORMAL HIGH (ref 0.0–0.5)
Eosinophils Relative: 9 %
HCT: 29.8 % — ABNORMAL LOW (ref 39.0–52.0)
Hemoglobin: 9.3 g/dL — ABNORMAL LOW (ref 13.0–17.0)
Immature Granulocytes: 0 %
Lymphocytes Relative: 23 %
Lymphs Abs: 2.6 10*3/uL (ref 0.7–4.0)
MCH: 27.2 pg (ref 26.0–34.0)
MCHC: 31.2 g/dL (ref 30.0–36.0)
MCV: 87.1 fL (ref 80.0–100.0)
Monocytes Absolute: 0.6 10*3/uL (ref 0.1–1.0)
Monocytes Relative: 5 %
Neutro Abs: 7.1 10*3/uL (ref 1.7–7.7)
Neutrophils Relative %: 62 %
Platelets: 236 10*3/uL (ref 150–400)
RBC: 3.42 MIL/uL — ABNORMAL LOW (ref 4.22–5.81)
RDW: 14.9 % (ref 11.5–15.5)
WBC: 11.4 10*3/uL — ABNORMAL HIGH (ref 4.0–10.5)
nRBC: 0 % (ref 0.0–0.2)

## 2021-01-03 LAB — BRAIN NATRIURETIC PEPTIDE: B Natriuretic Peptide: 1013.4 pg/mL — ABNORMAL HIGH (ref 0.0–100.0)

## 2021-01-03 LAB — COMPREHENSIVE METABOLIC PANEL
ALT: 11 U/L (ref 0–44)
AST: 14 U/L — ABNORMAL LOW (ref 15–41)
Albumin: 3.2 g/dL — ABNORMAL LOW (ref 3.5–5.0)
Alkaline Phosphatase: 88 U/L (ref 38–126)
Anion gap: 10 (ref 5–15)
BUN: 68 mg/dL — ABNORMAL HIGH (ref 8–23)
CO2: 24 mmol/L (ref 22–32)
Calcium: 8.2 mg/dL — ABNORMAL LOW (ref 8.9–10.3)
Chloride: 101 mmol/L (ref 98–111)
Creatinine, Ser: 8.23 mg/dL — ABNORMAL HIGH (ref 0.61–1.24)
GFR, Estimated: 7 mL/min — ABNORMAL LOW (ref >60)
Glucose, Bld: 178 mg/dL — ABNORMAL HIGH (ref 70–99)
Potassium: 5.1 mmol/L (ref 3.5–5.1)
Sodium: 135 mmol/L (ref 135–145)
Total Bilirubin: 0.7 mg/dL (ref 0.3–1.2)
Total Protein: 6.9 g/dL (ref 6.5–8.1)

## 2021-01-03 LAB — PROCALCITONIN: Procalcitonin: 0.27 ng/mL

## 2021-01-03 LAB — TROPONIN I (HIGH SENSITIVITY)
Troponin I (High Sensitivity): 44 ng/L — ABNORMAL HIGH (ref <18)
Troponin I (High Sensitivity): 40 ng/L — ABNORMAL HIGH (ref <18)

## 2021-01-03 LAB — LACTIC ACID, PLASMA
Lactic Acid, Venous: 1.2 mmol/L (ref 0.5–1.9)
Lactic Acid, Venous: 1.1 mmol/L (ref 0.5–1.9)

## 2021-01-03 LAB — TSH: TSH: 1.386 u[IU]/mL (ref 0.350–4.500)

## 2021-01-03 MED ORDER — MIDODRINE HCL 5 MG PO TABS
5.0000 mg | ORAL_TABLET | ORAL | Status: AC
  Administered 2021-01-03: 5 mg via ORAL

## 2021-01-03 MED ORDER — POLYETHYLENE GLYCOL 3350 17 G PO PACK: 17.0000 g | PACK | Freq: Two times a day (BID) | ORAL | Status: DC | PRN

## 2021-01-03 MED ORDER — TAMSULOSIN HCL 0.4 MG PO CAPS: 0.4000 mg | ORAL_CAPSULE | Freq: Every day | ORAL | Status: DC

## 2021-01-03 MED ORDER — CALCIUM CARBONATE ANTACID 500 MG PO CHEW: 1.0000 | CHEWABLE_TABLET | Freq: Three times a day (TID) | ORAL | Status: DC | PRN

## 2021-01-03 MED ORDER — ONDANSETRON HCL 4 MG/2ML IJ SOLN: 4.0000 mg | Freq: Four times a day (QID) | INTRAMUSCULAR | Status: DC | PRN

## 2021-01-03 MED ORDER — DOCUSATE SODIUM 100 MG PO CAPS: 100.0000 mg | ORAL_CAPSULE | Freq: Two times a day (BID) | ORAL | Status: DC | PRN

## 2021-01-03 MED ORDER — ONDANSETRON 4 MG PO TBDP
4.0000 mg | ORAL_TABLET | Freq: Three times a day (TID) | ORAL | Status: DC | PRN
  Filled 2021-01-03: qty 1

## 2021-01-03 MED ORDER — EPOETIN ALFA 10000 UNIT/ML IJ SOLN: 10000.0000 [IU] | INTRAMUSCULAR | Status: DC

## 2021-01-03 MED ORDER — ACETAMINOPHEN 500 MG PO TABS: 1000.0000 mg | ORAL_TABLET | Freq: Three times a day (TID) | ORAL | Status: DC | PRN

## 2021-01-03 MED ORDER — ALUM & MAG HYDROXIDE-SIMETH 200-200-20 MG/5ML PO SUSP: 15.0000 mL | Freq: Four times a day (QID) | ORAL | Status: DC | PRN

## 2021-01-03 NOTE — ED Notes (Signed)
[facility] RN aware of assigned bed

## 2021-01-03 NOTE — Progress Notes (Signed)
Patient completed dialysis treatment as ordered. 0.5 liters removed over 3 hours. Patient tolerated well. No adverse effect noted or reported.

## 2021-01-03 NOTE — Consult Note (Addendum)
[facility] Consultation Note  Patient ID: Peter Pearson, MRN: [id number], DOB/AGE: Apr 01, 1956 65 y.o. Admit date: 01/03/2021   Date of Consult: 01/03/2021 Primary Physician: Debbrah Alar, PA-C Primary Cardiologist: Dr. Nash Shearer  Chief Complaint:  Chief Complaint  Patient presents with   Near Syncope   Reason for Consult:  Bradycardia, hypotension  HPI: 65 y.o. male with a past medical history of CKD on dialysis, anemia, hypertension, OSA, coronary artery disease s/p stent in March, 2022 currently on dual antiplatelet therapy.  Patient came to the ED via EMS after feeling extremely dizzy and lightheaded prior to dialysis.  Patient states that he did not got up in the morning he took his regular medications and began to feel dizzy when he got to the dialysis center.  Patient states that he took a midodrine when he began to feel dizzy and did not see any improvement.  EMS was called and reported a blood pressure of 83/46.  Patient received 500 mL IV fluids with blood pressure improvement.  Patient was noted to be bradycardic in the 50s on presentation to the ED.  He denies any chest pain, palpitations, shortness of breath, weakness.  [facility] [place] and states that he had a nuclear medicine stress test performed on Tuesday of this week which he said had normal results.  Past Medical History:  Diagnosis Date   Adequate anticoagulation on anticoagulant therapy    Anemia    Chronic kidney disease    Gout    Hemodialysis patient ([place])    Hyperlipidemia    Hypertension    Sleep apnea       Surgical History:  Past Surgical History:  Procedure Laterality Date   AV FISTULA PLACEMENT Left 08/10/2019   Procedure: Left Upper Extremity AV Fistula creation;  Surgeon: Algernon Huxley, MD;  Location: [facility] OR[facility];  Service: Vascular;  Laterality: Left;   COLONOSCOPY     COLONOSCOPY WITH PROPOFOL N/A 05/17/2020   Procedure:  COLONOSCOPY WITH PROPOFOL;  Surgeon: Virgel Manifold, MD;  Location: [facility];  Service: Endoscopy;  Laterality: N/A;   DIALYSIS/PERMA CATHETER INSERTION N/A 08/08/2019   Procedure: DIALYSIS/PERMA CATHETER INSERTION;  Surgeon: Algernon Huxley, MD;  Location: [facility];  Service: Cardiovascular;  Laterality: N/A;   DIALYSIS/PERMA CATHETER REMOVAL N/A 12/13/2019   Procedure: DIALYSIS/PERMA CATHETER REMOVAL;  Surgeon: Katha Cabal, MD;  Location: [facility];  Service: Cardiovascular;  Laterality: N/A;     Home Meds: Prior to Admission medications   Medication Sig Start Date End Date Taking? Authorizing Provider  allopurinol (ZYLOPRIM) 100 MG tablet Take 100 mg by mouth daily.   Yes [provider]  calcium acetate (PHOSLO) 667 MG capsule Take 2,001 mg by mouth 3 (three) times daily. 11/11/20  Yes [provider]  clopidogrel (PLAVIX) 75 MG tablet Take 75 mg by mouth daily. 11/28/20  Yes [provider]  [facility] ASPIRIN LOW STRENGTH 81 MG EC tablet Take 81 mg by mouth daily. 12/13/20  Yes [provider]  hydrALAZINE (APRESOLINE) 25 MG tablet Take 25 mg by mouth every 8 (eight) hours. 07/10/20  Yes [provider]  isosorbide mononitrate (IMDUR) 30 MG 24 hr tablet Take 30 mg by mouth daily. 11/28/20  Yes [provider]  metoprolol tartrate (LOPRESSOR) 25 MG tablet Take 50 mg by mouth 2 (two) times daily. 11/01/20  Yes [provider]  midodrine (PROAMATINE) 5 MG tablet Take 5 mg  of midodrine on dialysis days (Tuesday, Thursday, and Saturday) 08/11/19  Yes Elodia Florence., MD  Multiple Vitamin Artis Flock) TABS Take 1 tablet by mouth daily.   Yes [provider]  Rosuvastatin Calcium 20 MG CPSP Take 10 mg by mouth daily.    Yes [provider]  tamsulosin (FLOMAX) 0.4 MG CAPS capsule Take 0.4 mg by mouth daily.   Yes [provider]  apixaban (ELIQUIS) 5 MG TABS tablet Take 1 tablet (5  mg total) by mouth 2 (two) times daily. For 1 week and then take 1 tablet / 5 mg twice daily for 3 more weeks. Patient not taking: No sig reported 09/20/19   Earlie Server, MD  Na Sulfate-K Sulfate-Mg Sulf 17.5-3.13-1.6 GM/177ML SOLN At 5 PM the day before procedure take 1 bottle and 5 hours before procedure take 1 bottle. 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 +)    Amoxicillin-Pot Clavulanate Rash    Social History   Socioeconomic History   Marital status: Married    Spouse name: Shamim   Number of children: 3   Years of education: Not on file   Highest education level: Not on file  Occupational History   Occupation: Insurance account manager company   Tobacco Use   Smoking status: Former    Packs/day: 0.00    Years: 5.00    Pack years: 0.00    Types: Cigarettes    Quit date: 08/06/1989    Years since quitting: 31.4   Smokeless tobacco: Never  Vaping Use   Vaping Use: Never used  Substance and Sexual Activity   Alcohol use: Never   Drug use: Never   Sexual activity: Not on file  Other Topics Concern   Not on file  Social History Narrative   Not on file   Social Determinants of Health   Financial Resource Strain: Not on file  Food Insecurity: Not on file  Transportation Needs: Not on file  Physical Activity: Not on file  Stress: Not on file  Social Connections: Not on file  Intimate Partner Violence: Not on file     Family History  Problem Relation Age of Onset   Heart disease Father      Review of Systems Positive for dizziness, lightheadedness Negative  for: General:  chills, fever, night sweats or weight changes.  Cardiovascular: PND orthopnea syncope dizziness  Dermatological skin lesions rashes Respiratory: Cough congestion Urologic: Frequent urination urination at night and hematuria Abdominal: negative for nausea, vomiting, diarrhea, bright red blood per rectum, melena, or hematemesis Neurologic: negative for visual changes, and/or hearing changes  All other systems reviewed and are otherwise negative except as noted above.  Labs: No results for input(s): CKTOTAL, CKMB, TROPONINI in the last 72 hours. Lab Results  Component Value Date   WBC 11.4 (H) 01/03/2021   HGB 9.3 (L) 01/03/2021   HCT 29.8 (L) 01/03/2021   MCV 87.1 01/03/2021   PLT 236 01/03/2021    Recent Labs  Lab 01/03/21 0832  NA 135  K 5.1  CL 101  CO2 24  BUN 68*  CREATININE 8.23*  CALCIUM 8.2*  PROT 6.9  BILITOT 0.7  ALKPHOS 88  ALT 11  AST 14*  GLUCOSE 178*   No results found for: CHOL, HDL, LDLCALC, TRIG No results found for: DDIMER  Radiology/Studies:  DG Chest Portable 1 View  Result Date: 01/03/2021 CLINICAL DATA:  Dizziness EXAM: PORTABLE CHEST 1 VIEW COMPARISON:  11/13/2020 FINDINGS: Gross cardiomegaly. Pulmonary vascular prominence and diffuse bilateral interstitial opacity. The visualized skeletal structures are unremarkable. IMPRESSION: Gross cardiomegaly with pulmonary vascular prominence and diffuse bilateral interstitial opacity, consistent with edema. No focal airspace opacity. Electronically Signed   By: Eddie Candle M.D.   On: 01/03/2021 09:14    EKG: EKG done in the ED today is not currently available for my review.  Reported by ED to show junctional rhythm with a rate of 49, no evidence of acute ischemia or arrhythmia.  Weights: Filed Weights   01/03/21 0821  Weight: 120.2 kg     Physical Exam: Blood pressure 136/63, pulse 62, temperature 97.7 F (36.5 C), temperature source Oral, resp. rate 20, height 5\' 8"  (1.727 m), weight  120.2 kg, SpO2 98 %. Body mass index is 40.29 kg/m. General: Well developed, well nourished, in no acute distress. Head eyes ears nose throat: Normocephalic, atraumatic, sclera non-icteric, no xanthomas, nares are without discharge. No apparent thyromegaly and/or mass  Lungs: Normal respiratory effort.  no wheezes, no rales, no rhonchi.  Heart: RRR with normal S1 S2. no murmur gallop, no rub, PMI is normal size and placement, carotid upstroke normal without bruit, jugular venous pressure is normal Abdomen: Soft, non-tender, non-distended with normoactive bowel sounds. No hepatomegaly. No rebound/guarding. No obvious abdominal masses. Abdominal aorta is normal size without bruit Extremities: 1+ edema. no cyanosis, no clubbing, no ulcers  Peripheral : 2+ bilateral upper extremity pulses, 2+ bilateral femoral pulses, 2+ bilateral dorsal pedal pulse Neuro: Alert and oriented. No facial asymmetry. No focal deficit. Moves all extremities spontaneously. Musculoskeletal: Normal muscle tone without kyphosis Psych:  Responds to questions appropriately with a normal affect.    Assessment: 65 year old patient with dizziness, lightheadedness likely secondary to hypotension, symptomatic bradycardia from metoprolol use prior to dialysis.  Patient appears to be stable at this time from a cardiovascular standpoint.  Plan: -Hold metoprolol for concerns of symptomatic bradycardia and hypotension. -Admit for surveillance and reassess tomorrow to assess patient's symptom improvement and how well he tolerates dialysis.  Signed, Jettie Booze PA-C [facility] 01/03/2021, 4:18 PM The patient has been interviewed and examined. I agree with assessment and plan above. Serafina Royals MD [facility]

## 2021-01-03 NOTE — ED Notes (Signed)
Unable to reach dialysis to request details concerning patient's dialysis today.

## 2021-01-03 NOTE — ED Triage Notes (Signed)
Pt BIBA from dialysis after experiencing syncopal episode. EMS reports initial BP 83/46 that improved with 548ml NS. EMS reported 101/52. Patient did not receive dialysis today. Last dialysis was Monday.

## 2021-01-03 NOTE — Progress Notes (Signed)
[facility]  ROUNDING NOTE   Subjective:   [name] was admitted to [facility] on 01/03/2021 for Dizziness [R42]  Last hemodialysis treatment went to hemodialysis center today and started to get dizzy and lightheadedness.   Patient brought to [facility] ED he was found to have bradycardia and junctional rhythm. Patient was recently restarted on metoprolol. He took metoprolol, isosorbide mononitrate and tamsulosin this morning. Then he took midodrine at the dialysis clinic.    Objective:  Vital signs in last 24 hours:  Temp:  [97.7 F (36.5 C)] 97.7 F (36.5 C) (08/04 1001) Pulse Rate:  [49-54] 54 (08/04 1001) Resp:  [18] 18 (08/04 1001) BP: (88-102)/(50-53) 101/53 (08/04 1001) SpO2:  [97 %-99 %] 99 % (08/04 1001) Weight:  [120.2 kg] 120.2 kg (08/04 0821)  Weight change:  Filed Weights   01/03/21 0821  Weight: 120.2 kg    Intake/Output: No intake/output data recorded.   Intake/Output this shift:  No intake/output data recorded.  Physical Exam: General: NAD, sittin gup in bed  Head: Normocephalic, atraumatic. Moist oral mucosal membranes  Eyes: Anicteric, PERRL  Neck: Supple, trachea midline  Lungs:  Clear to auscultation  Heart: bradycardia  Abdomen:  Soft, nontender,   Extremities:  No peripheral edema.  Neurologic: Nonfocal, moving all four extremities  Skin: No lesions  Access: Left AVF    Basic Metabolic Panel: Recent Labs  Lab 01/03/21 0832  NA 135  K 5.1  CL 101  CO2 24  GLUCOSE 178*  BUN 68*  CREATININE 8.23*  CALCIUM 8.2*    Liver Function Tests: Recent Labs  Lab 01/03/21 0832  AST 14*  ALT 11  ALKPHOS 88  BILITOT 0.7  PROT 6.9  ALBUMIN 3.2*   No results for input(s): LIPASE, AMYLASE in the last 168 hours. No results for input(s): AMMONIA in the last 168 hours.  CBC: Recent Labs  Lab 01/03/21 0832  WBC 11.4*  NEUTROABS 7.1  HGB 9.3*  HCT 29.8*  MCV 87.1  PLT 236    Cardiac Enzymes: No results for input(s):  CKTOTAL, CKMB, CKMBINDEX, TROPONINI in the last 168 hours.  BNP: Invalid input(s): POCBNP  CBG: No results for input(s): GLUCAP in the last 168 hours.  Microbiology: Results for orders placed or performed during the hospital encounter of 05/15/20  SARS CORONAVIRUS 2 (TAT 6-24 HRS) Nasopharyngeal Nasopharyngeal Swab     Status: None   Collection Time: 05/15/20  9:54 AM   Specimen: Nasopharyngeal Swab  Result Value Ref Range Status   SARS Coronavirus 2 NEGATIVE NEGATIVE Final    Comment: (NOTE) SARS-CoV-2 target nucleic acids are NOT DETECTED.  The SARS-CoV-2 RNA is generally detectable in upper and lower respiratory specimens during the acute phase of infection. Negative results do not preclude SARS-CoV-2 infection, do not rule out co-infections with other pathogens, and should not be used as the sole basis for treatment or other patient management decisions. Negative results must be combined with clinical observations, patient history, and epidemiological information. The expected result is Negative.  Fact Sheet for Patients: SugarRoll.be  Fact Sheet for Healthcare Providers: https://www.woods-mathews.com/  This test is not yet approved or cleared by the [place] FDA and  has been authorized for detection and/or diagnosis of SARS-CoV-2 by FDA under an Emergency Use Authorization (EUA). This EUA will remain  in effect (meaning this test can be used) for the duration of the COVID-19 declaration under Se ction 564(b)(1) of the Act, 21 U.S.C. section 360bbb-3(b)(1), unless the authorization is  terminated or revoked sooner.  Performed at [facility], [place] [address] [place], [place] [address]     Coagulation Studies: No results for input(s): LABPROT, INR in the last 72 hours.  Urinalysis: No results for input(s): COLORURINE, LABSPEC, PHURINE, GLUCOSEU, HGBUR, BILIRUBINUR, KETONESUR, PROTEINUR, UROBILINOGEN, NITRITE,  LEUKOCYTESUR in the last 72 hours.  Invalid input(s): APPERANCEUR    Imaging: DG Chest Portable 1 View  Result Date: 01/03/2021 CLINICAL DATA:  Dizziness EXAM: PORTABLE CHEST 1 VIEW COMPARISON:  11/13/2020 FINDINGS: Gross cardiomegaly. Pulmonary vascular prominence and diffuse bilateral interstitial opacity. The visualized skeletal structures are unremarkable. IMPRESSION: Gross cardiomegaly with pulmonary vascular prominence and diffuse bilateral interstitial opacity, consistent with edema. No focal airspace opacity. Electronically Signed   By: Eddie Candle M.D.   On: 01/03/2021 09:14     Medications:       Assessment/ Plan:  [name] is a 65 y.o. [place] male with end stage renal disease on hemodialysis, hypertension, hyperlipidemia, sleep apnea, gout, coronary artery disease, HIT, diabetes mellitus type II, who is admitted to [facility] on 01/03/2021 for Dizziness [R42]  [facility] TTS [facility] Left AVF 118kg.   End Stage Renal Disease: TTS schedule. Dialysis for today. Orders prepared.   Hypertension: with new onset junctional rhythm. Home regimen of metoprolol, isosorbide mononitrate, tamsulosin. Takes midodrine before dialysis treatments.  - holding metoprolol, isosorbide mononitrate and tamsulosin - Continue midodrine.  - Appreciate cardiology input.   Anemia of chronic kidney disease: hemoglobin 9.3 - EPO with HD treatment  Secondary Hyperparathyroidism: PTH elevated at 1117 on 7/12. With hyperphosphatemia with 6.8.  - Continue sevelamer with meals.  - Restart cinacalcet    LOS: 0 Peter Pearson 8/4/202212:26 PM

## 2021-01-03 NOTE — ED Notes (Signed)
MD aware of patient's vital signs. MD in room.

## 2021-01-03 NOTE — ED Notes (Signed)
Patient to Dialysis via transport.  Patient will be transported to floor by nursing staff after dialysis

## 2021-01-03 NOTE — ED Notes (Signed)
Report given to dialysis RN

## 2021-01-03 NOTE — ED Provider Notes (Signed)
[facility] Emergency Department Provider Note  ____________________________________________   Event Date/Time   First MD Initiated Contact with Patient 01/03/21 [phone]     (approximate)  I have reviewed the triage vital signs and the nursing notes.   HISTORY  Chief Complaint Near Syncope   HPI Peter Pearson is a 65 y.o. male past medical history of gout, HTN, OSA, anemia and CAD status post recent four-vessel stenting in March of this year currently on ASA and Plavix who presents via EMS from dialysis center after experiencing lightheadedness and dizziness prior to starting dialysis.  Patient states he last got dialyzed on 8/1.  States he was stress test on the following day and was told to hold his metoprolol but is since resumed them and took both his metoprolol, Imdur and Flomax this morning.  States he took 1 dose of his midodrine and does not feel dizzy now but felt much better earlier.  He has not had any chest pain, cough, shortness of breath, headache, earache, sore throat, nausea, vomiting, diarrhea, dysuria, rash or recent injuries or falls.  No other acute concerns at this time.         Past Medical History:  Diagnosis Date   Adequate anticoagulation on anticoagulant therapy    Anemia    Chronic kidney disease    Gout    Hemodialysis patient ([place])    Hyperlipidemia    Hypertension    Sleep apnea     Patient Active Problem List   Diagnosis Date Noted   Dizziness 01/03/2021   Screen for colon cancer    Cecal polyp    Polyp of descending colon    Type 2 diabetes mellitus without complication, without long-term current use of insulin ([place]) 04/05/2020   ESRD (end stage renal disease) on dialysis ([place]) 09/07/2019   Cough    HIT (heparin-induced thrombocytopenia) ([place])    COVID-19 08/24/2019   Elevated troponin    Thrombocytopenia (HCC)    Tachycardia 08/23/2019   ESRD (end stage renal disease) ([place]) 08/07/2019   Anemia of chronic  kidney failure, stage 5 ([place]) 08/07/2019   Acute flank pain 09/21/2017   Acute gout due to renal impairment involving left foot 08/14/2017   Alport's syndrome 05/01/2016   Chronic conjunctivitis 08/20/2015   Benign prostatic hyperplasia with lower urinary tract symptoms 02/19/2015   Vitamin D deficiency 01/17/2014   Diabetes mellitus with renal manifestation ([place]) 12/10/2012   Obesity 12/10/2012   OSA on CPAP 12/10/2012   Stage 5 chronic kidney disease not on chronic dialysis ([place]) 12/10/2012   Essential hypertension 12/07/2012    Past Surgical History:  Procedure Laterality Date   AV FISTULA PLACEMENT Left 08/10/2019   Procedure: Left Upper Extremity AV Fistula creation;  Surgeon: Algernon Huxley, MD;  Location: [facility] ORS;  Service: Vascular;  Laterality: Left;   COLONOSCOPY     COLONOSCOPY WITH PROPOFOL N/A 05/17/2020   Procedure: COLONOSCOPY WITH PROPOFOL;  Surgeon: Virgel Manifold, MD;  Location: [facility] ENDOSCOPY;  Service: Endoscopy;  Laterality: N/A;   DIALYSIS/PERMA CATHETER INSERTION N/A 08/08/2019   Procedure: DIALYSIS/PERMA CATHETER INSERTION;  Surgeon: Algernon Huxley, MD;  Location: [place] [facility];  Service: Cardiovascular;  Laterality: N/A;   DIALYSIS/PERMA CATHETER REMOVAL N/A 12/13/2019   Procedure: DIALYSIS/PERMA CATHETER REMOVAL;  Surgeon: Katha Cabal, MD;  Location: [place] [facility];  Service: Cardiovascular;  Laterality: N/A;    Prior to Admission medications   Medication Sig Start Date End Date Taking? Authorizing  Provider  allopurinol (ZYLOPRIM) 100 MG tablet Take 100 mg by mouth daily.    [provider]  apixaban (ELIQUIS) 5 MG TABS tablet Take 1 tablet (5 mg total) by mouth 2 (two) times daily. For 1 week and then take 1 tablet / 5 mg twice daily for 3 more weeks. Patient taking differently: Take 5 mg by mouth 2 (two) times daily. 09/20/19   Earlie Server, MD  midodrine (PROAMATINE) 5 MG tablet Take 5 mg of midodrine on dialysis days (Tuesday,  Thursday, and Saturday) 08/11/19   Elodia Florence., MD  Multiple Vitamin Artis Flock) TABS Take 1 tablet by mouth daily.    [provider]  Na Sulfate-K Sulfate-Mg Sulf 17.5-3.13-1.6 GM/177ML SOLN At 5 PM the day before procedure take 1 bottle and 5 hours before procedure take 1 bottle. 04/24/20   Virgel Manifold, MD  Rosuvastatin Calcium 20 MG CPSP Take 10 mg by mouth daily.     [provider]  tamsulosin (FLOMAX) 0.4 MG CAPS capsule Take 0.4 mg by mouth daily.    [provider]    Allergies Heparin and Amoxicillin-pot clavulanate  Family History  Problem Relation Age of Onset   Heart disease Father     Social History Social History   Tobacco Use   Smoking status: Former    Packs/day: 0.00    Years: 5.00    Pack years: 0.00    Types: Cigarettes    Quit date: 08/06/1989    Years since quitting: 31.4   Smokeless tobacco: Never  Vaping Use   Vaping Use: Never used  Substance Use Topics   Alcohol use: Never   Drug use: Never    Review of Systems  Review of Systems  Constitutional:  Negative for chills and fever.  HENT:  Negative for sore throat.   Eyes:  Negative for pain.  Respiratory:  Negative for cough and stridor.   Cardiovascular:  Negative for chest pain.  Gastrointestinal:  Negative for vomiting.  Genitourinary:  Negative for dysuria.  Musculoskeletal:  Negative for myalgias.  Skin:  Negative for rash.  Neurological:  Positive for dizziness and weakness. Negative for seizures, loss of consciousness and headaches.  Psychiatric/Behavioral:  Negative for suicidal ideas.   All other systems reviewed and are negative.    ____________________________________________   PHYSICAL EXAM:  VITAL SIGNS: ED Triage Vitals  Enc Vitals Group     BP      Pulse      Resp      Temp      Temp src      SpO2      Weight      Height      Head Circumference      Peak Flow      Pain Score      Pain Loc      Pain Edu?      Excl.  in [place]?    Vitals:   01/03/21 0821 01/03/21 1001  BP: (!) 102/50 (!) 101/53  Pulse: (!) 52 (!) 54  Resp: 18 18  Temp: 97.7 F (36.5 C) 97.7 F (36.5 C)  SpO2:  99%   Physical Exam Vitals and nursing note reviewed.  Constitutional:      Appearance: He is well-developed. He is obese. He is ill-appearing.  HENT:     Head: Normocephalic and atraumatic.     Right Ear: External ear normal.     Left Ear: External ear normal.  Nose: Nose normal.  Eyes:     Conjunctiva/sclera: Conjunctivae normal.  Cardiovascular:     Rate and Rhythm: Regular rhythm. Bradycardia present.     Heart sounds: No murmur heard. Pulmonary:     Effort: Pulmonary effort is normal. No respiratory distress.     Breath sounds: Decreased breath sounds present.  Abdominal:     Palpations: Abdomen is soft.     Tenderness: There is no abdominal tenderness. There is no right CVA tenderness or left CVA tenderness.  Musculoskeletal:     Cervical back: Neck supple.     Right lower leg: Edema present.     Left lower leg: Edema present.  Skin:    General: Skin is warm and dry.     Capillary Refill: Capillary refill takes more than 3 seconds.  Neurological:     Mental Status: He is alert and oriented to person, place, and time.  Psychiatric:        Mood and Affect: Mood normal.     ____________________________________________   LABS (all labs ordered are listed, but only abnormal results are displayed)  Labs Reviewed  CBC WITH DIFFERENTIAL/PLATELET - Abnormal; Notable for the following components:      Result Value   WBC 11.4 (*)    RBC 3.42 (*)    Hemoglobin 9.3 (*)    HCT 29.8 (*)    Eosinophils Absolute 1.0 (*)    All other components within normal limits  COMPREHENSIVE METABOLIC PANEL - Abnormal; Notable for the following components:   Glucose, Bld 178 (*)    BUN 68 (*)    Creatinine, Ser 8.23 (*)    Calcium 8.2 (*)    Albumin 3.2 (*)    AST 14 (*)    GFR, Estimated 7 (*)    All other  components within normal limits  BRAIN NATRIURETIC PEPTIDE - Abnormal; Notable for the following components:   B Natriuretic Peptide 1,013.4 (*)    All other components within normal limits  TROPONIN I (HIGH SENSITIVITY) - Abnormal; Notable for the following components:   Troponin I (High Sensitivity) 44 (*)    All other components within normal limits  LACTIC ACID, PLASMA  TSH  LACTIC ACID, PLASMA  PROCALCITONIN  TROPONIN I (HIGH SENSITIVITY)   ____________________________________________  EKG  Junctional rhythm with a rate of 49, QTc interval of 553, right axis deviation, without other clearance of acute ischemia or significant arrhythmia.  Nonspecific change in lead I and aVL. ____________________________________________  RADIOLOGY  ED MD interpretation: Chest x-ray has bilateral pulmonary edema without focal consolidation, pneumothorax, large effusion or other clear acute intrathoracic process.  Official radiology report(s): DG Chest Portable 1 View  Result Date: 01/03/2021 CLINICAL DATA:  Dizziness EXAM: PORTABLE CHEST 1 VIEW COMPARISON:  11/13/2020 FINDINGS: Gross cardiomegaly. Pulmonary vascular prominence and diffuse bilateral interstitial opacity. The visualized skeletal structures are unremarkable. IMPRESSION: Gross cardiomegaly with pulmonary vascular prominence and diffuse bilateral interstitial opacity, consistent with edema. No focal airspace opacity. Electronically Signed   By: Eddie Candle M.D.   On: 01/03/2021 09:14    ____________________________________________   PROCEDURES  Procedure(s) performed (including Critical Care):  .1-3 Lead EKG Interpretation  Date/Time: 01/03/2021 10:37 AM Performed by: Lucrezia Starch, MD Authorized by: Lucrezia Starch, MD     Interpretation: non-specific     ECG rate assessment: bradycardic     Rhythm: other rhythm     ____________________________________________   INITIAL IMPRESSION / ASSESSMENT AND PLAN / ED  COURSE  Patient presents with above to history exam for assessment of some low blood pressures dialysis associate with some dizziness early this morning.  Patient did not receive dialysis.  He did take his metoprolol Imdur and Flomax this morning.  On arrival he is bradycardic with a heart rate of 49 and hypertensive with BP of 88/58 with otherwise stable vital signs on room air.  On exam he does appear slightly volume overloaded.  Differential includes pulmonary edema related to missed dialysis and bradycardia from medicines taken this morning, ACS, metabolic derangements, hypothyroidism sepsis.  No history of any recent injuries or bleeding to suggest hemorrhagic shock.  No history of any recent trauma.  Low suspicion for toxic ingestion.  ECG shows some nonspecific findings and a junctional rate of 49.  Initial troponin is 44 and given absence of chest pain I have a lower suspicion or acute coronary thrombosis suspect some mild demand ischemia in the setting of volume overload from bradycardia.  CMP shows a BUN of 68 and a creatinine of 8.23 without other significant electrolyte or metabolic derangements.  Consistent with missed dialysis this morning and history of ESRD.  BMP shows a BNP of 1013 consistent with findings on EKG exam suggestive of fluid buildup in the pulmonary vasculature which I again suspect is likely related to beta-blocker medicines taken this morning.  Chest x-ray has bilateral pulmonary edema without focal consolidation, pneumothorax, large effusion or other clear acute intrathoracic process.  Lactic acid is 1.2.  Lower suspicion for sepsis at this time given absence of fever, leukocytosis or elevated lactic acid.  Discussed with cardiologist Dr. Rockey Situ who recommended holding metoprolol but did not think patient needed emergent echo.  Also discussed with nephrologist Dr. Juleen China who stated he would arrange patient to be dialyzed today.  Also rented a dose of midodrine which  was given.  I will admit to medical service for further evaluation and management.    ____________________________________________   FINAL CLINICAL IMPRESSION(S) / ED DIAGNOSES  Final diagnoses:  ESRD (end stage renal disease) ([place])  Symptomatic bradycardia  Troponin I above reference range  Hypotension, unspecified hypotension type    Medications  midodrine (PROAMATINE) tablet 5 mg (5 mg Oral Provided for home use 01/03/21 0843)     ED Discharge Orders     None        Note:  This document was prepared using Dragon voice recognition software and may include unintentional dictation errors.    Lucrezia Starch, MD 01/03/21 1037

## 2021-01-03 NOTE — H&P (Addendum)
History and Physical    [name] DGU:[id number] DOB: 25-May-1956 DOA: 01/03/2021  PCP: Debbrah Alar, PA-C  Patient coming from: home  I have personally briefly reviewed patient's old medical records in [place]  Chief Complaint: dizziness  HPI: Peter Pearson is a 65 y.o. male with medical history significant for ESRD on hemodialysis, anemia, HTN, OSA and CAD s/p stent in Feb 2022 currently on aspirin and Plavix, presented to the ED via EMS after feeling dizzy at dialysis center.   Before dialysis even started, pt felt lightheaded like he was going to faint. EMS was called and reported a blood pressure of 83/46 that improved with 500 mL IV fluids. Bradycardic in the 50s on presentation. Denied LOC, chest pain, palpitations, weakness, headache or shortness of breath. He reported he took his metoprolol and Imdur this morning, and a dose of midodrine which he takes before dialysis. No recent changes in medication. Pt had a nuclear stress test on Tuesday 8/2 which was normal per cardiology.   ED Course: Initial vitals: afebrile, pulse 49, BP 88/50, sating 97% on room air.  Labs notable for Hbg 9.3, BNP 1,013.4. Troponin 44, 40.  EKG reportedly junctional rhythm with rate of 49, no evidence of acute ischemia or arrhythmia. Chest x-ray with diffuse bilateral interstitial opacity, consistent with edema per radiology. Tested positive for COVID-19 two weeks ago. Cardiology consulted, recommended holding metoprolol. Nephrology consulted, arranging for inpatient dialysis.  ED provider requested admission for observation.   Assessment/Plan Active Problems:   Dizziness  # Dizziness and pre-syncope 2/2 # Hypotension and bradycardia --took metop, Imdur and Flomax this morning.  Pt does have a hx of hypotension during dialysis, for which he takes midodrine prior to dialysis. Plan: --Hold home metop and Imdur --No need for repeat Echo since just had a stress test  # ESRD on HD  TTS --pt missed 1 session due to stress test, and couldn't receive dialysis at outpatient dialysis center due to hypotension. Plan: --iHD per cardiology  # Mild troponin elevation --Likely due to ESRD status.  No chest pain.    # Hx of HTN, not currently active --Hold home metop, Imdur and Flomax due to hypotension --May need to discontinue BP meds on discharge.  # anemia of chronic disease --Epo with dialysis    DVT prophylaxis: SCD/Compression stockings Code Status: Full code  Family Communication: friend updated at bedside  Disposition Plan: home  Consults called: nephrology and cardiology by ED provider Level of care: Med-Surg   Review of Systems: As per HPI otherwise complete review of systems negative.   Past Medical History:  Diagnosis Date   Adequate anticoagulation on anticoagulant therapy    Anemia    Chronic kidney disease    Gout    Hemodialysis patient ([place])    Hyperlipidemia    Hypertension    Sleep apnea     Past Surgical History:  Procedure Laterality Date   AV FISTULA PLACEMENT Left 08/10/2019   Procedure: Left Upper Extremity AV Fistula creation;  Surgeon: Algernon Huxley, MD;  Location: [facility] ORS;  Service: Vascular;  Laterality: Left;   COLONOSCOPY     COLONOSCOPY WITH PROPOFOL N/A 05/17/2020   Procedure: COLONOSCOPY WITH PROPOFOL;  Surgeon: Virgel Manifold, MD;  Location: [facility] ENDOSCOPY;  Service: Endoscopy;  Laterality: N/A;   DIALYSIS/PERMA CATHETER INSERTION N/A 08/08/2019   Procedure: DIALYSIS/PERMA CATHETER INSERTION;  Surgeon: Algernon Huxley, MD;  Location: [place] [facility];  Service: Cardiovascular;  Laterality: N/A;  DIALYSIS/PERMA CATHETER REMOVAL N/A 12/13/2019   Procedure: DIALYSIS/PERMA CATHETER REMOVAL;  Surgeon: Katha Cabal, MD;  Location: [place] [facility];  Service: Cardiovascular;  Laterality: N/A;     reports that he quit smoking about 31 years ago. He has never used smokeless tobacco. He reports that he does not  drink alcohol and does not use drugs.  Allergies  Allergen Reactions   Heparin     HIT Positive on 08/25/2019 (HIT Ab + and SRA +)  Other reaction(s): Heparin-Induced Thrombocytopenia HIT Positive on 08/25/2019 (HIT Ab + and SRA +)    Amoxicillin-Pot Clavulanate Rash    Family History  Problem Relation Age of Onset   Heart disease Father      Prior to Admission medications   Medication Sig Start Date End Date Taking? Authorizing Provider  allopurinol (ZYLOPRIM) 100 MG tablet Take 100 mg by mouth daily.   Yes [provider]  calcium acetate (PHOSLO) 667 MG capsule Take 2,001 mg by mouth 3 (three) times daily. 11/11/20  Yes [provider]  clopidogrel (PLAVIX) 75 MG tablet Take 75 mg by mouth daily. 11/28/20  Yes [provider]  [employer] ASPIRIN LOW STRENGTH 81 MG EC tablet Take 81 mg by mouth daily. 12/13/20  Yes [provider]  hydrALAZINE (APRESOLINE) 25 MG tablet Take 25 mg by mouth every 8 (eight) hours. 07/10/20  Yes [provider]  isosorbide mononitrate (IMDUR) 30 MG 24 hr tablet Take 30 mg by mouth daily. 11/28/20  Yes [provider]  metoprolol tartrate (LOPRESSOR) 25 MG tablet Take 50 mg by mouth 2 (two) times daily. 11/01/20  Yes [provider]  midodrine (PROAMATINE) 5 MG tablet Take 5 mg of midodrine on dialysis days (Tuesday, Thursday, and Saturday) 08/11/19  Yes Elodia Florence., MD  Multiple Vitamin Artis Flock) TABS Take 1 tablet by mouth daily.   Yes [provider]  Rosuvastatin Calcium 20 MG CPSP Take 10 mg by mouth daily.    Yes [provider]  tamsulosin (FLOMAX) 0.4 MG CAPS capsule Take 0.4 mg by mouth daily.   Yes [provider]  apixaban (ELIQUIS) 5 MG TABS tablet Take 1 tablet (5 mg total) by mouth 2 (two) times daily. For 1 week and then take 1 tablet / 5 mg twice daily for 3 more weeks. Patient not taking: No sig reported 09/20/19   Earlie Server, MD  Na Sulfate-K Sulfate-Mg  Sulf 17.5-3.13-1.6 GM/177ML SOLN At 5 PM the day before procedure take 1 bottle and 5 hours before procedure take 1 bottle. Patient not taking: Reported on 01/03/2021 04/24/20   Virgel Manifold, MD    Physical Exam: Vitals:   01/03/21 1001 01/03/21 1257 01/03/21 1300 01/03/21 1400  BP: (!) 101/53  133/68 136/63  Pulse: (!) 54 (!) 52 (!) 54 62  Resp: 18 (!) 28 (!) 21 20  Temp: 97.7 F (36.5 C)  97.7 F (36.5 C) 97.7 F (36.5 C)  TempSrc: Oral  Oral Oral  SpO2: 99% 97% 97% 98%  Weight:      Height:        Constitutional: NAD, AAOx3 HEENT: conjunctivae and lids normal, EOMI CV: No cyanosis.   RESP: normal respiratory effort, on RA Extremities: No effusions, edema in BLE SKIN: warm, dry Neuro: II - XII grossly intact.   Psych: Normal mood and affect.  Appropriate judgement and reason   Labs on Admission: I have personally reviewed following labs and imaging studies  CBC: Recent Labs  Lab 01/03/21 0832  WBC 11.4*  NEUTROABS 7.1  HGB 9.3*  HCT 29.8*  MCV 87.1  PLT 741   Basic Metabolic Panel: Recent Labs  Lab 01/03/21 0832  NA 135  K 5.1  CL 101  CO2 24  GLUCOSE 178*  BUN 68*  CREATININE 8.23*  CALCIUM 8.2*   GFR: Estimated Creatinine Clearance: 11.6 mL/min (A) (by C-G formula based on SCr of 8.23 mg/dL (H)). Liver Function Tests: Recent Labs  Lab 01/03/21 0832  AST 14*  ALT 11  ALKPHOS 88  BILITOT 0.7  PROT 6.9  ALBUMIN 3.2*   No results for input(s): LIPASE, AMYLASE in the last 168 hours. No results for input(s): AMMONIA in the last 168 hours. Coagulation Profile: No results for input(s): INR, PROTIME in the last 168 hours. Cardiac Enzymes: No results for input(s): CKTOTAL, CKMB, CKMBINDEX, TROPONINI in the last 168 hours. BNP (last 3 results) No results for input(s): PROBNP in the last 8760 hours. HbA1C: No results for input(s): HGBA1C in the last 72 hours. CBG: No results for input(s): GLUCAP in the last 168 hours. Lipid Profile: No  results for input(s): CHOL, HDL, LDLCALC, TRIG, CHOLHDL, LDLDIRECT in the last 72 hours. Thyroid Function Tests: Recent Labs    01/03/21 0832  TSH 1.386   Anemia Panel: No results for input(s): VITAMINB12, FOLATE, FERRITIN, TIBC, IRON, RETICCTPCT in the last 72 hours. Urine analysis: No results found for: COLORURINE, APPEARANCEUR, [place], [place], [place], [place], BILIRUBINUR, KETONESUR, PROTEINUR, UROBILINOGEN, NITRITE, LEUKOCYTESUR  Radiological Exams on Admission: DG Chest Portable 1 View  Result Date: 01/03/2021 CLINICAL DATA:  Dizziness EXAM: PORTABLE CHEST 1 VIEW COMPARISON:  11/13/2020 FINDINGS: Gross cardiomegaly. Pulmonary vascular prominence and diffuse bilateral interstitial opacity. The visualized skeletal structures are unremarkable. IMPRESSION: Gross cardiomegaly with pulmonary vascular prominence and diffuse bilateral interstitial opacity, consistent with edema. No focal airspace opacity. Electronically Signed   By: Eddie Candle M.D.   On: 01/03/2021 09:14      Enzo Bi MD [facility]  If 7PM-7AM, please contact night-coverage 01/03/2021, 6:03 PM

## 2021-01-04 DIAGNOSIS — N186 End stage renal disease: Secondary | ICD-10-CM

## 2021-01-04 DIAGNOSIS — I959 Hypotension, unspecified: Secondary | ICD-10-CM

## 2021-01-04 DIAGNOSIS — R778 Other specified abnormalities of plasma proteins: Secondary | ICD-10-CM

## 2021-01-04 DIAGNOSIS — R001 Bradycardia, unspecified: Secondary | ICD-10-CM

## 2021-01-04 LAB — BASIC METABOLIC PANEL
Anion gap: 8 (ref 5–15)
BUN: 44 mg/dL — ABNORMAL HIGH (ref 8–23)
CO2: 27 mmol/L (ref 22–32)
Calcium: 8.2 mg/dL — ABNORMAL LOW (ref 8.9–10.3)
Chloride: 101 mmol/L (ref 98–111)
Creatinine, Ser: 5.97 mg/dL — ABNORMAL HIGH (ref 0.61–1.24)
GFR, Estimated: 10 mL/min — ABNORMAL LOW (ref >60)
Glucose, Bld: 121 mg/dL — ABNORMAL HIGH (ref 70–99)
Potassium: 4.3 mmol/L (ref 3.5–5.1)
Sodium: 136 mmol/L (ref 135–145)

## 2021-01-04 LAB — CBC
HCT: 30.5 % — ABNORMAL LOW (ref 39.0–52.0)
Hemoglobin: 9.9 g/dL — ABNORMAL LOW (ref 13.0–17.0)
MCH: 27.3 pg (ref 26.0–34.0)
MCHC: 32.5 g/dL (ref 30.0–36.0)
MCV: 84.3 fL (ref 80.0–100.0)
Platelets: 246 10*3/uL (ref 150–400)
RBC: 3.62 MIL/uL — ABNORMAL LOW (ref 4.22–5.81)
RDW: 14.7 % (ref 11.5–15.5)
WBC: 9.8 10*3/uL (ref 4.0–10.5)
nRBC: 0 % (ref 0.0–0.2)

## 2021-01-04 LAB — HEPATITIS B SURFACE ANTIGEN: Hepatitis B Surface Ag: NONREACTIVE

## 2021-01-04 LAB — HIV ANTIBODY (ROUTINE TESTING W REFLEX): HIV Screen 4th Generation wRfx: NONREACTIVE

## 2021-01-04 LAB — PHOSPHORUS: Phosphorus: 4 mg/dL (ref 2.5–4.6)

## 2021-01-04 LAB — MAGNESIUM: Magnesium: 1.1 mg/dL — ABNORMAL LOW (ref 1.7–2.4)

## 2021-01-04 LAB — HEPATITIS B CORE ANTIBODY, IGM: Hep B C IgM: NONREACTIVE

## 2021-01-04 NOTE — Discharge Summary (Signed)
PATIENT DETAILS Name: Peter Pearson Age: 65 y.o. Sex: male Date of Birth: 04-13-1956 MRN: [id number]. Admitting Physician: Enzo Bi, MD NAT:[id number], [place]on Gammon, PA-C  Admit Date: 01/03/2021 Discharge date: 01/04/2021  Recommendations for Outpatient Follow-up:  Follow up with PCP in 1-2 weeks Please obtain CMP/CBC in one week Metoprolol on hold on discharge.   Admitted From:  Home  Disposition: [place]: No  Equipment/Devices: None  Discharge Condition: Stable  CODE STATUS: FULL CODE  Diet recommendation:  Diet Order             Diet - low sodium heart healthy           Diet renal with fluid restriction Fluid restriction: 1200 mL Fluid; Room service appropriate? Yes; Fluid consistency: Thin  Diet effective now                    Brief Summary: See H&P, Labs, Consult and Test reports for all details in brief, patient is a 65 year old male with history of ESRD on HD TTS, CAD s/p PCI February 2022, HTN, OSA who presented from his HD center after he was found to be hypotensive and bradycardic.  He was admitted by the hospitalist service for further inpatient monitoring and treatment.  Brief Hospital Course: Dizziness with hypotension and bradycardia: Felt to be due to medications-primarily beta-blocker.  Dizziness has resolved-he is now normotensive.  Evaluated by cardiology with recommendations to hold metoprolol on discharge.  ESRD: On HD TTS-resume HD per prior schedule.  Okay per nephrology to DC.  HTN: We will resume Imdur/hydralazine on discharge-holding metoprolol given symptomatic bradycardia/hypotension on admission.  Anemia: Normocytic-related to ESRD-darbepoetin IV iron defer to outpatient nephrology  CAD: No anginal symptoms-continue dual antiplatelet agents.  Note-Per patient-he is no longer on Eliquis  Morbid Obesity: Estimated body mass index is 40.29 kg/m as calculated from the following:   Height as of this encounter: 5\' 8"   (1.727 m).   Weight as of this encounter: 120.2 kg.    Procedures None  Discharge Diagnoses:  Active Problems:   Dizziness   Discharge Instructions:  Activity:  As tolerated   Discharge Instructions     Call MD for:  difficulty breathing, headache or visual disturbances   Complete by: As directed    Call MD for:  extreme fatigue   Complete by: As directed    Call MD for:  persistant dizziness or light-headedness   Complete by: As directed    Diet - low sodium heart healthy   Complete by: As directed    Discharge instructions   Complete by: As directed    Follow with Primary MD  Debbrah Alar, PA-C in 1-2 weeks  Please follow-up with her primary cardiologist in 1 week.  Please get a complete blood count and chemistry panel checked by your Primary MD at your next visit, and again as instructed by your Primary MD.  Get Medicines reviewed and adjusted: Please take all your medications with you for your next visit with your Primary MD  Laboratory/radiological data: Please request your Primary MD to go over all hospital tests and procedure/radiological results at the follow up, please ask your Primary MD to get all Hospital records sent to his/her office.  In some cases, they will be blood work, cultures and biopsy results pending at the time of your discharge. Please request that your primary care M.D. follows up on these results.  Also Note the following: If you experience worsening of  your admission symptoms, develop shortness of breath, life threatening emergency, suicidal or homicidal thoughts you must seek medical attention immediately by calling 911 or calling your MD immediately  if symptoms less severe.  You must read complete instructions/literature along with all the possible adverse reactions/side effects for all the Medicines you take and that have been prescribed to you. Take any new Medicines after you have completely understood and accpet all the  possible adverse reactions/side effects.   Do not drive when taking Pain medications or sleeping medications (Benzodaizepines)  Do not take more than prescribed Pain, Sleep and Anxiety Medications. It is not advisable to combine anxiety,sleep and pain medications without talking with your primary care practitioner  Special Instructions: If you have smoked or chewed Tobacco  in the last 2 yrs please stop smoking, stop any regular Alcohol  and or any Recreational drug use.  Wear Seat belts while driving.  Please note: You were cared for by a hospitalist during your hospital stay. Once you are discharged, your primary care physician will handle any further medical issues. Please note that NO REFILLS for any discharge medications will be authorized once you are discharged, as it is imperative that you return to your primary care physician (or establish a relationship with a primary care physician if you do not have one) for your post hospital discharge needs so that they can reassess your need for medications and monitor your lab values.   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   calcium acetate 667 MG capsule Commonly known as: PHOSLO Take 2,001 mg by mouth 3 (three) times daily.   clopidogrel 75 MG tablet Commonly known as: PLAVIX Take 75 mg by mouth daily.   [facility]  Aspirin Low Strength 81 MG EC tablet Generic drug: aspirin Take 81 mg by mouth daily.   hydrALAZINE 25 MG tablet Commonly known as: APRESOLINE Take 25 mg by mouth every 8 (eight) hours.   isosorbide mononitrate 30 MG 24 hr tablet Commonly known as: IMDUR Take 30 mg by mouth daily.   midodrine 5 MG tablet Commonly known as: PROAMATINE Take 5 mg of midodrine on dialysis days (Tuesday, Thursday, and Saturday)   Quintabs Tabs Take 1 tablet by mouth daily.   Rosuvastatin Calcium 20 MG Cpsp Take 10 mg by mouth daily.   tamsulosin 0.4 MG Caps capsule Commonly known as: FLOMAX Take 0.4 mg by mouth daily.        Follow-up Information     Debbrah Alar, PA-C. Schedule an appointment as soon as possible for a visit in 1 week(s).   Specialty: Cardiology Contact information: [place] [address] [phone]         Horton Finer, MD Follow up  in 1 week(s).   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 Pulmonary vascular prominence and diffuse bilateral interstitial opacity. The visualized skeletal structures are unremarkable. IMPRESSION: Gross cardiomegaly with pulmonary vascular prominence and diffuse bilateral interstitial opacity, consistent with edema. No focal airspace opacity. Electronically Signed   By: Eddie Candle M.D.   On: 01/03/2021 09:14      TODAY-DAY OF DISCHARGE:  Subjective:   Tressie Ellis today has no headache,no chest abdominal pain,no new weakness tingling or numbness, feels much better wants to go home today.   Objective:   Blood pressure (!) 141/74, pulse 73, temperature 97.8 F (36.6 C), temperature source Oral, resp. rate 20, height 5\' 8"  (1.727 m), weight 120.2 kg, SpO2 98 %.  Intake/Output Summary (Last 24 hours) at 01/04/2021 0945 Last data filed at 01/03/2021 1845 Gross per 24 hour  Intake --  Output 510 ml  Net -510 ml   Filed Weights   01/03/21 0821  Weight: 120.2 kg    Exam: Awake Alert, Oriented *3, No new F.N deficits, Normal affect [place].AT,PERRAL Supple Neck,No JVD, No cervical lymphadenopathy appriciated.  Symmetrical Chest wall movement, Good air movement bilaterally, CTAB RRR,No Gallops,Rubs or new Murmurs, No Parasternal Heave +ve B.Sounds, Abd Soft, Non tender, No organomegaly appriciated, No rebound -guarding or rigidity. No Cyanosis, Clubbing or edema, No new Rash or bruise   PERTINENT RADIOLOGIC STUDIES: DG Chest Portable 1 View  Result Date: 01/03/2021 CLINICAL DATA:  Dizziness EXAM: PORTABLE CHEST 1 VIEW COMPARISON:  11/13/2020 FINDINGS: Gross cardiomegaly. Pulmonary vascular prominence and diffuse bilateral interstitial opacity. The visualized skeletal structures are unremarkable. IMPRESSION: Gross cardiomegaly with pulmonary vascular prominence and diffuse bilateral interstitial opacity, consistent with edema. No focal airspace opacity. Electronically Signed   By: Eddie Candle M.D.   On: 01/03/2021 09:14     PERTINENT LAB RESULTS: CBC: Recent Labs    01/03/21 0832 01/04/21 0505  WBC 11.4* 9.8  HGB 9.3* 9.9*  HCT 29.8* 30.5*  PLT 236 246   CMET CMP     Component Value Date/Time   NA 136 01/04/2021 0505   K 4.3 01/04/2021 0505   CL 101 01/04/2021 0505   CO2 27 01/04/2021 0505   GLUCOSE 121 (H) 01/04/2021 0505   BUN 44 (H) 01/04/2021 0505   CREATININE 5.97 (H)  01/04/2021 0505   CALCIUM 8.2 (L) 01/04/2021 0505   PROT 6.9 01/03/2021 0832   ALBUMIN 3.2 (L) 01/03/2021 0832   AST 14 (L) 01/03/2021 0832   ALT 11 01/03/2021 0832   ALKPHOS 88 01/03/2021 0832   BILITOT 0.7 01/03/2021 0832   GFRNONAA 10 (L) 01/04/2021 0505   GFRAA 10 (L) 08/27/2019 0816    GFR Estimated Creatinine Clearance: 16 mL/min (A) (by C-G formula based on SCr of 5.97 mg/dL (H)). No results for input(s): LIPASE, AMYLASE in the last 72 hours. No results for input(s): CKTOTAL, CKMB, CKMBINDEX, TROPONINI  in the last 72 hours. Invalid input(s): POCBNP No results for input(s): DDIMER in the last 72 hours. No results for input(s): HGBA1C in the last 72 hours. No results for input(s): CHOL, HDL, LDLCALC, TRIG, CHOLHDL, LDLDIRECT in the last 72 hours. Recent Labs    01/03/21 0832  TSH 1.386   No results for input(s): VITAMINB12, FOLATE, FERRITIN, TIBC, IRON, RETICCTPCT in the last 72 hours. Coags: No results for input(s): INR in the last 72 hours.  Invalid input(s): PT Microbiology: No results found for this or any previous visit (from the past 240 hour(s)).  FURTHER DISCHARGE INSTRUCTIONS:  Get Medicines reviewed and adjusted: Please take all your medications with you for your next visit with your Primary MD  Laboratory/radiological data: Please request your Primary MD to go over all hospital tests and procedure/radiological results at the follow up, please ask your Primary MD to get all Hospital records sent to his/her office.  In some cases, they will be blood work, cultures and biopsy results pending at the time of your discharge. Please request that your primary care M.D. goes through all the records of your hospital data and follows up on these results.  Also Note the following: If you experience worsening of your admission symptoms, develop shortness of breath, life threatening emergency, suicidal or homicidal thoughts you must seek medical attention immediately by  calling 911 or calling your MD immediately  if symptoms less severe.  You must read complete instructions/literature along with all the possible adverse reactions/side effects for all the Medicines you take and that have been prescribed to you. Take any new Medicines after you have completely understood and accpet all the possible adverse reactions/side effects.   Do not drive when taking Pain medications or sleeping medications (Benzodaizepines)  Do not take more than prescribed Pain, Sleep and Anxiety Medications. It is not advisable to combine anxiety,sleep and pain medications without talking with your primary care practitioner  Special Instructions: If you have smoked or chewed Tobacco  in the last 2 yrs please stop smoking, stop any regular Alcohol  and or any Recreational drug use.  Wear Seat belts while driving.  Please note: You were cared for by a hospitalist during your hospital stay. Once you are discharged, your primary care physician will handle any further medical issues. Please note that NO REFILLS for any discharge medications will be authorized once you are discharged, as it is imperative that you return to your primary care physician (or establish a relationship with a primary care physician if you do not have one) for your post hospital discharge needs so that they can reassess your need for medications and monitor your lab values.  Total Time spent coordinating discharge including counseling, education and face to face time equals 35 minutes.  SignedOren Binet 01/04/2021 9:45 AM

## 2021-01-04 NOTE — Progress Notes (Signed)
[facility] Encounter Note  Patient: Peter Pearson / Admit Date: 01/03/2021 / Date of Encounter: 01/04/2021, 8:46 AM   Subjective: 65 year old male with a past medical history of CKD on dialysis, anemia, hypertension, OSA, coronary artery disease s/p stent in March, 2022 currently on dual antiplatelet therapy.  Patient came to the ED via EMS after feeling extremely dizzy and lightheaded prior to dialysis where he was noted to be bradycardic and hypotensive.  Patient states that he took a midodrine when he began to feel dizzy but did not see any improvement.  EMS had reported a blood pressure of 83/46 with a significant improvement after fluid administration.  Patient was also noted to be bradycardic in the 50s on presentation to the ED.  During his admission he has not had any symptoms of chest pain, palpitations, shortness of breath, weakness.  Today patient states that he feels much better.  He states that he had no issues with dialysis in the hospital yesterday.  He states that his blood pressure and pulse were within normal limits for the entire duration of dialysis.  Today he is sitting upright in a chair and denies any lightheadedness, dizziness.  Patient [facility] as an [facility] [place] by Dr.Vallabhajosyula.  He very recently had a nuclear medicine stress test performed which showed normal results with no evidence of myocardial ischemia.  Review of Systems: Positive for: None Negative for: Vision change, hearing change, syncope, dizziness, nausea, vomiting,diarrhea, bloody stool, stomach pain, cough, congestion, diaphoresis, urinary frequency, urinary pain,skin lesions, skin rashes Others previously listed  Objective: Telemetry: Patient has not currently hooked up to telemetry Physical Exam: Blood pressure (!) 141/74, pulse 73, temperature 97.8 F (36.6 C), temperature source Oral, resp. rate 20, height 5\' 8"  (1.727 m), weight 120.2 kg,  SpO2 98 %. Body mass index is 40.29 kg/m. General: Well developed, well nourished, in no acute distress. Head: Normocephalic, atraumatic, sclera non-icteric, no xanthomas, nares are without discharge. Neck: No apparent masses Lungs: Normal respirations with no wheezes, no rhonchi, no rales , no crackles   Heart: Regular rate and rhythm, normal S1 S2, no murmur, no rub, no gallop, PMI is normal size and placement, carotid upstroke normal without bruit, jugular venous pressure normal Abdomen: Soft, non-tender, non-distended with normoactive bowel sounds. No hepatosplenomegaly. Abdominal aorta is normal size without bruit Extremities: No edema, no clubbing, no cyanosis, no ulcers,  Peripheral: 2+ radial, 2+ femoral, 2+ dorsal pedal pulses Neuro: Alert and oriented. Moves all extremities spontaneously. Psych:  Responds to questions appropriately with a normal affect.   Intake/Output Summary (Last 24 hours) at 01/04/2021 0846 Last data filed at 01/03/2021 1845 Gross per 24 hour  Intake --  Output 510 ml  Net -510 ml    Inpatient Medications:   [START ON 01/05/2021] epoetin (EPOGEN/PROCRIT) injection  10,000 Units Intravenous Q T,Th,Sa-HD   Infusions:   Labs: Recent Labs    01/03/21 0832 01/04/21 0505  NA 135 136  K 5.1 4.3  CL 101 101  CO2 24 27  GLUCOSE 178* 121*  BUN 68* 44*  CREATININE 8.23* 5.97*  CALCIUM 8.2* 8.2*  MG  --  1.1*  PHOS  --  4.0   Recent Labs    01/03/21 0832  AST 14*  ALT 11  ALKPHOS 88  BILITOT 0.7  PROT 6.9  ALBUMIN 3.2*   Recent Labs    01/03/21 0832 01/04/21 0505  WBC 11.4* 9.8  NEUTROABS 7.1  --  HGB 9.3* 9.9*  HCT 29.8* 30.5*  MCV 87.1 84.3  PLT 236 246   No results for input(s): CKTOTAL, CKMB, TROPONINI in the last 72 hours. Invalid input(s): POCBNP No results for input(s): HGBA1C in the last 72 hours.   Weights: Filed Weights   01/03/21 0821  Weight: 120.2 kg     Radiology/Studies:  DG Chest Portable 1 View  Result Date:  01/03/2021 CLINICAL DATA:  Dizziness EXAM: PORTABLE CHEST 1 VIEW COMPARISON:  11/13/2020 FINDINGS: Gross cardiomegaly. Pulmonary vascular prominence and diffuse bilateral interstitial opacity. The visualized skeletal structures are unremarkable. IMPRESSION: Gross cardiomegaly with pulmonary vascular prominence and diffuse bilateral interstitial opacity, consistent with edema. No focal airspace opacity. Electronically Signed   By: Eddie Candle M.D.   On: 01/03/2021 09:14     Assessment and Recommendation  65 y.o. male with a history of coronary artery disease with recent stenting in 09/2020, ESRD on dialysis with an episode of hypotension and bradycardia prior to dialysis yesterday.  This episode was likely precipitated by use of metoprolol prior to dialysis.  Patient has been doing well since holding metoprolol and had no problems with dialysis yesterday. -Continue to hold metoprolol on discharge -Patient is currently stable from a cardiovascular standpoint.  Please contact cardiology again if patient's signs or symptoms return or significantly worsen. -Patient should follow-up with his primary cardiologist [facility]ula [facility] [place] for further medication management.  Signed, Jettie Booze PA-C

## 2021-01-04 NOTE — Progress Notes (Signed)
[facility]  ROUNDING NOTE   Subjective:   Peter Pearson was admitted to [facility] on 01/03/2021 for Dizziness [R42] ESRD (end stage renal disease) ([place]) [N18.6] Symptomatic bradycardia [R00.1] Troponin I above reference range [R77.8] Hypotension, unspecified hypotension type [I95.9]  Last hemodialysis treatment went to hemodialysis center today and started to get dizzy and lightheadedness.   Patient brought to [facility] ED he was found to have bradycardia and junctional rhythm. Patient was recently restarted on metoprolol. He took metoprolol, isosorbide mononitrate and tamsulosin this morning. Then he took midodrine at the dialysis clinic.   Received dialysis yesterday Tolerated well Patient seen this am, laying in bed Denies dizziness  States he is ready for discharge Patient seen later sitting at bedisde  Objective:  Vital signs in last 24 hours:  Temp:  [97.7 F (36.5 C)-98.4 F (36.9 C)] 97.8 F (36.6 C) (08/05 0755) Pulse Rate:  [52-83] 73 (08/05 0755) Resp:  [18-28] 20 (08/05 0755) BP: (101-152)/(53-87) 141/74 (08/05 0755) SpO2:  [97 %-100 %] 98 % (08/05 0755)  Weight change:  Filed Weights   01/03/21 0821  Weight: 120.2 kg    Intake/Output: I/O last 3 completed shifts: In: -  Out: 510 [Other:510]   Intake/Output this shift:  No intake/output data recorded.  Physical Exam: General: NAD, sitting at bedside  Head: Normocephalic, atraumatic. Moist oral mucosal membranes  Eyes: Anicteric  Lungs:  Clear to auscultation  Heart: bradycardia  Abdomen:  Soft, nontender  Extremities:  No peripheral edema.  Neurologic: Nonfocal, moving all four extremities  Skin: No lesions  Access: Left AVF    Basic Metabolic Panel: Recent Labs  Lab 01/03/21 0832 01/04/21 0505  NA 135 136  K 5.1 4.3  CL 101 101  CO2 24 27  GLUCOSE 178* 121*  BUN 68* 44*  CREATININE 8.23* 5.97*  CALCIUM 8.2* 8.2*  MG  --  1.1*  PHOS  --  4.0     Liver Function  Tests: Recent Labs  Lab 01/03/21 0832  AST 14*  ALT 11  ALKPHOS 88  BILITOT 0.7  PROT 6.9  ALBUMIN 3.2*    No results for input(s): LIPASE, AMYLASE in the last 168 hours. No results for input(s): AMMONIA in the last 168 hours.  CBC: Recent Labs  Lab 01/03/21 0832 01/04/21 0505  WBC 11.4* 9.8  NEUTROABS 7.1  --   HGB 9.3* 9.9*  HCT 29.8* 30.5*  MCV 87.1 84.3  PLT 236 246     Cardiac Enzymes: No results for input(s): CKTOTAL, CKMB, CKMBINDEX, TROPONINI in the last 168 hours.  BNP: Invalid input(s): POCBNP  CBG: No results for input(s): GLUCAP in the last 168 hours.  Microbiology: Results for orders placed or performed during the hospital encounter of 05/15/20  SARS CORONAVIRUS 2 (TAT 6-24 HRS) Nasopharyngeal Nasopharyngeal Swab     Status: None   Collection Time: 05/15/20  9:54 AM   Specimen: Nasopharyngeal Swab  Result Value Ref Range Status   SARS Coronavirus 2 NEGATIVE NEGATIVE Final    Comment: (NOTE) SARS-CoV-2 target nucleic acids are NOT DETECTED.  The SARS-CoV-2 RNA is generally detectable in upper and lower respiratory specimens during the acute phase of infection. Negative results do not preclude SARS-CoV-2 infection, do not rule out co-infections with other pathogens, and should not be used as the sole basis for treatment or other patient management decisions. Negative results must be combined with clinical observations, patient history, and epidemiological information. The expected result is Negative.  Fact Sheet for  Patients: SugarRoll.be  Fact Sheet for Healthcare Providers: https://www.woods-mathews.com/  This test is not yet approved or cleared by the [place] FDA and  has been authorized for detection and/or diagnosis of SARS-CoV-2 by FDA under an Emergency Use Authorization (EUA). This EUA will remain  in effect (meaning this test can be used) for the duration of the COVID-19 declaration  under Se ction 564(b)(1) of the Act, 21 U.S.C. section 360bbb-3(b)(1), unless the authorization is terminated or revoked sooner.  Performed at [facility], [place] [address], [place], [place] [address]     Coagulation Studies: No results for input(s): LABPROT, INR in the last 72 hours.  Urinalysis: No results for input(s): COLORURINE, LABSPEC, PHURINE, GLUCOSEU, HGBUR, BILIRUBINUR, KETONESUR, PROTEINUR, UROBILINOGEN, NITRITE, LEUKOCYTESUR in the last 72 hours.  Invalid input(s): APPERANCEUR    Imaging: DG Chest Portable 1 View  Result Date: 01/03/2021 CLINICAL DATA:  Dizziness EXAM: PORTABLE CHEST 1 VIEW COMPARISON:  11/13/2020 FINDINGS: Gross cardiomegaly. Pulmonary vascular prominence and diffuse bilateral interstitial opacity. The visualized skeletal structures are unremarkable. IMPRESSION: Gross cardiomegaly with pulmonary vascular prominence and diffuse bilateral interstitial opacity, consistent with edema. No focal airspace opacity. Electronically Signed   By: Eddie Candle M.D.   On: 01/03/2021 09:14     Medications:       Assessment/ Plan:  Peter Pearson is a 65 y.o. [place] male with end stage renal disease on hemodialysis, hypertension, hyperlipidemia, sleep apnea, gout, coronary artery disease, HIT, diabetes mellitus type II, who is admitted to [facility] on 01/03/2021 for Dizziness [R42] ESRD (end stage renal disease) ([place]) [N18.6] Symptomatic bradycardia [R00.1] Troponin I above reference range [R77.8] Hypotension, unspecified hypotension type [I95.9]  [facility] TTS [facility] Left AVF 118kg.   End Stage Renal Disease: TTS schedule. Received dialysis yesterday, tolerated well. Cleared to discharge from renal stance  Hypertension: with new onset junctional rhythm. Home regimen of metoprolol, isosorbide mononitrate, tamsulosin. Takes midodrine before dialysis treatments.  - holding metoprolol, isosorbide mononitrate and tamsulosin - Continue midodrine.  -  Appreciate cardiology input.   Anemia of chronic kidney disease: hemoglobin 9.3 - EPO with HD treatment  Secondary Hyperparathyroidism: PTH elevated at 1117 on 7/12. With hyperphosphatemia with 6.8.  - Continue sevelamer with meals.  - Restart cinacalcet    LOS: 0 Falicity Sheets 8/5/20229:37 AM

## 2021-02-23 ENCOUNTER — Other Ambulatory Visit
Admission: RE | Admit: 2021-02-23 | Discharge: 2021-02-23 | Disposition: A | Discharge status: Home / Self Care | Payer: Medicare Other | Source: Other Acute Inpatient Hospital | Attending: Nephrology | Admitting: Nephrology

## 2021-02-23 DIAGNOSIS — E1129 Type 2 diabetes mellitus with other diabetic kidney complication: Secondary | ICD-10-CM | POA: Insufficient documentation

## 2021-02-23 LAB — BASIC METABOLIC PANEL
Anion gap: 10 (ref 5–15)
BUN: 47 mg/dL — ABNORMAL HIGH (ref 8–23)
CO2: 15 mmol/L — ABNORMAL LOW (ref 22–32)
Calcium: 8 mg/dL — ABNORMAL LOW (ref 8.9–10.3)
Chloride: 109 mmol/L (ref 98–111)
Creatinine, Ser: 5.7 mg/dL — ABNORMAL HIGH (ref 0.61–1.24)
GFR, Estimated: 10 mL/min — ABNORMAL LOW (ref >60)
Glucose, Bld: 105 mg/dL — ABNORMAL HIGH (ref 70–99)
Potassium: 4.8 mmol/L (ref 3.5–5.1)
Sodium: 134 mmol/L — ABNORMAL LOW (ref 135–145)

## 2021-02-23 LAB — CBC WITH DIFFERENTIAL/PLATELET
Abs Immature Granulocytes: 0.06 10*3/uL (ref 0.00–0.07)
Basophils Absolute: 0.1 10*3/uL (ref 0.0–0.1)
Basophils Relative: 1 %
Eosinophils Absolute: 1.1 10*3/uL — ABNORMAL HIGH (ref 0.0–0.5)
Eosinophils Relative: 12 %
HCT: 33.7 % — ABNORMAL LOW (ref 39.0–52.0)
Hemoglobin: 10.5 g/dL — ABNORMAL LOW (ref 13.0–17.0)
Immature Granulocytes: 1 %
Lymphocytes Relative: 29 %
Lymphs Abs: 2.8 10*3/uL (ref 0.7–4.0)
MCH: 26.7 pg (ref 26.0–34.0)
MCHC: 31.2 g/dL (ref 30.0–36.0)
MCV: 85.8 fL (ref 80.0–100.0)
Monocytes Absolute: 0.5 10*3/uL (ref 0.1–1.0)
Monocytes Relative: 5 %
Neutro Abs: 5.1 10*3/uL (ref 1.7–7.7)
Neutrophils Relative %: 52 %
Platelets: 288 10*3/uL (ref 150–400)
RBC: 3.93 MIL/uL — ABNORMAL LOW (ref 4.22–5.81)
RDW: 14.6 % (ref 11.5–15.5)
WBC: 9.7 10*3/uL (ref 4.0–10.5)
nRBC: 0 % (ref 0.0–0.2)

## 2021-02-26 ENCOUNTER — Other Ambulatory Visit
Admission: RE | Admit: 2021-02-26 | Discharge: 2021-02-26 | Disposition: A | Discharge status: Home / Self Care | Payer: Medicare Other | Source: Ambulatory Visit | Attending: Nephrology | Admitting: Nephrology

## 2021-02-26 DIAGNOSIS — N186 End stage renal disease: Secondary | ICD-10-CM | POA: Diagnosis not present

## 2021-02-26 DIAGNOSIS — Z01812 Encounter for preprocedural laboratory examination: Secondary | ICD-10-CM | POA: Insufficient documentation

## 2021-02-26 LAB — BASIC METABOLIC PANEL
Anion gap: 13 (ref 5–15)
BUN: 79 mg/dL — ABNORMAL HIGH (ref 8–23)
CO2: 17 mmol/L — ABNORMAL LOW (ref 22–32)
Calcium: 8 mg/dL — ABNORMAL LOW (ref 8.9–10.3)
Chloride: 106 mmol/L (ref 98–111)
Creatinine, Ser: 8.3 mg/dL — ABNORMAL HIGH (ref 0.61–1.24)
GFR, Estimated: 7 mL/min — ABNORMAL LOW (ref >60)
Glucose, Bld: 136 mg/dL — ABNORMAL HIGH (ref 70–99)
Potassium: 5.3 mmol/L — ABNORMAL HIGH (ref 3.5–5.1)
Sodium: 136 mmol/L (ref 135–145)

## 2021-02-26 LAB — PHOSPHORUS: Phosphorus: 8.6 mg/dL — ABNORMAL HIGH (ref 2.5–4.6)

## 2021-04-11 ENCOUNTER — Other Ambulatory Visit: Payer: Self-pay | Admitting: Nephrology

## 2021-04-11 DIAGNOSIS — Z111 Encounter for screening for respiratory tuberculosis: Secondary | ICD-10-CM

## 2021-04-16 ENCOUNTER — Other Ambulatory Visit: Payer: Self-pay

## 2021-04-16 ENCOUNTER — Ambulatory Visit
Admission: RE | Admit: 2021-04-16 | Discharge: 2021-04-16 | Disposition: A | Discharge status: Home / Self Care | Payer: Medicare Other | Attending: Nephrology | Admitting: Nephrology

## 2021-04-16 ENCOUNTER — Ambulatory Visit
Admission: RE | Admit: 2021-04-16 | Discharge: 2021-04-16 | Disposition: A | Discharge status: Home / Self Care | Payer: Medicare Other | Source: Ambulatory Visit | Attending: Nephrology | Admitting: Nephrology

## 2021-04-16 DIAGNOSIS — Z111 Encounter for screening for respiratory tuberculosis: Secondary | ICD-10-CM

## 2021-11-11 ENCOUNTER — Other Ambulatory Visit: Payer: Self-pay | Admitting: Nephrology

## 2021-11-11 DIAGNOSIS — N186 End stage renal disease: Secondary | ICD-10-CM

## 2022-01-23 ENCOUNTER — Ambulatory Visit
Admission: RE | Admit: 2022-01-23 | Discharge: 2022-01-23 | Disposition: A | Discharge status: Home / Self Care | Payer: Medicare Other | Attending: Diagnostic Radiology | Admitting: Diagnostic Radiology

## 2022-01-23 ENCOUNTER — Ambulatory Visit
Admission: RE | Admit: 2022-01-23 | Discharge: 2022-01-23 | Disposition: A | Discharge status: Home / Self Care | Payer: Medicare Other | Source: Ambulatory Visit | Attending: Nephrology | Admitting: Nephrology

## 2022-01-23 DIAGNOSIS — Z8611 Personal history of tuberculosis: Secondary | ICD-10-CM | POA: Diagnosis not present

## 2022-01-23 DIAGNOSIS — N186 End stage renal disease: Secondary | ICD-10-CM | POA: Insufficient documentation

## 2022-02-06 ENCOUNTER — Other Ambulatory Visit
Admission: RE | Admit: 2022-02-06 | Discharge: 2022-02-06 | Disposition: A | Discharge status: Home / Self Care | Payer: Medicare Other | Source: Ambulatory Visit | Attending: Nephrology | Admitting: Nephrology

## 2022-02-06 DIAGNOSIS — N186 End stage renal disease: Secondary | ICD-10-CM | POA: Diagnosis present

## 2022-02-06 LAB — RENAL FUNCTION PANEL
Albumin: 3.9 g/dL (ref 3.5–5.0)
Anion gap: 10 (ref 5–15)
BUN: 8 mg/dL (ref 8–23)
CO2: 29 mmol/L (ref 22–32)
Calcium: 8.4 mg/dL — ABNORMAL LOW (ref 8.9–10.3)
Chloride: 99 mmol/L (ref 98–111)
Creatinine, Ser: 1.35 mg/dL — ABNORMAL HIGH (ref 0.61–1.24)
GFR, Estimated: 59 mL/min — ABNORMAL LOW (ref >60)
Glucose, Bld: 90 mg/dL (ref 70–99)
Phosphorus: <1 mg/dL — CL (ref 2.5–4.6)
Potassium: 2.3 mmol/L — CL (ref 3.5–5.1)
Sodium: 138 mmol/L (ref 135–145)

## 2022-03-31 ENCOUNTER — Encounter (INDEPENDENT_AMBULATORY_CARE_PROVIDER_SITE_OTHER): Payer: Self-pay

## 2022-06-25 IMAGING — CR DG CHEST 2V
1 series · 2 of 2 positions shown · non-contrast
Comparison: 01/03/2021.

CLINICAL DATA: Screening for tuberculosis.  Cough for 2 weeks.

EXAM:
CHEST - 2 VIEW

[Series 1: dg chest 2 view · 0.14mm/px · 2 of 2 slices shown]
[im 1/2]
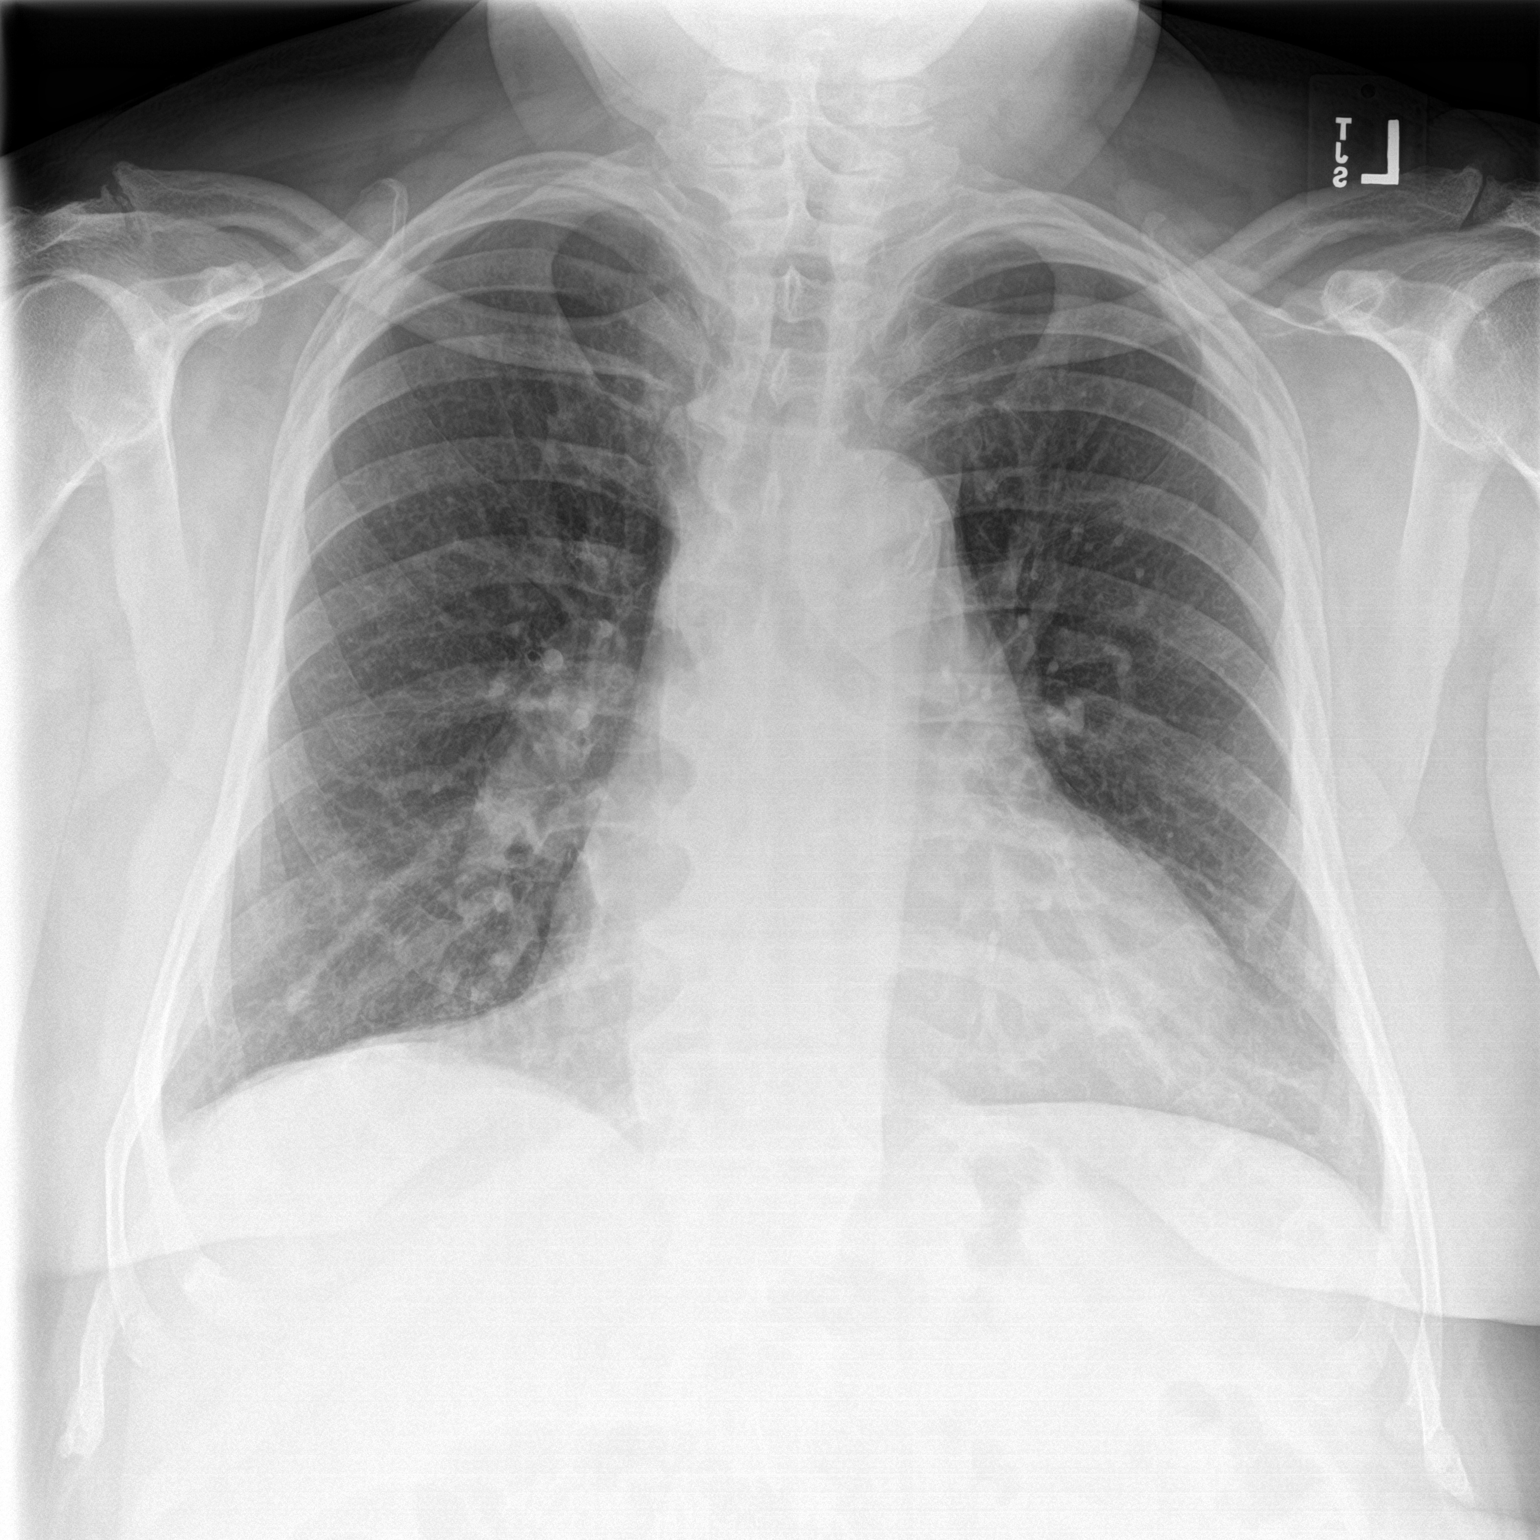
[im 2/2]
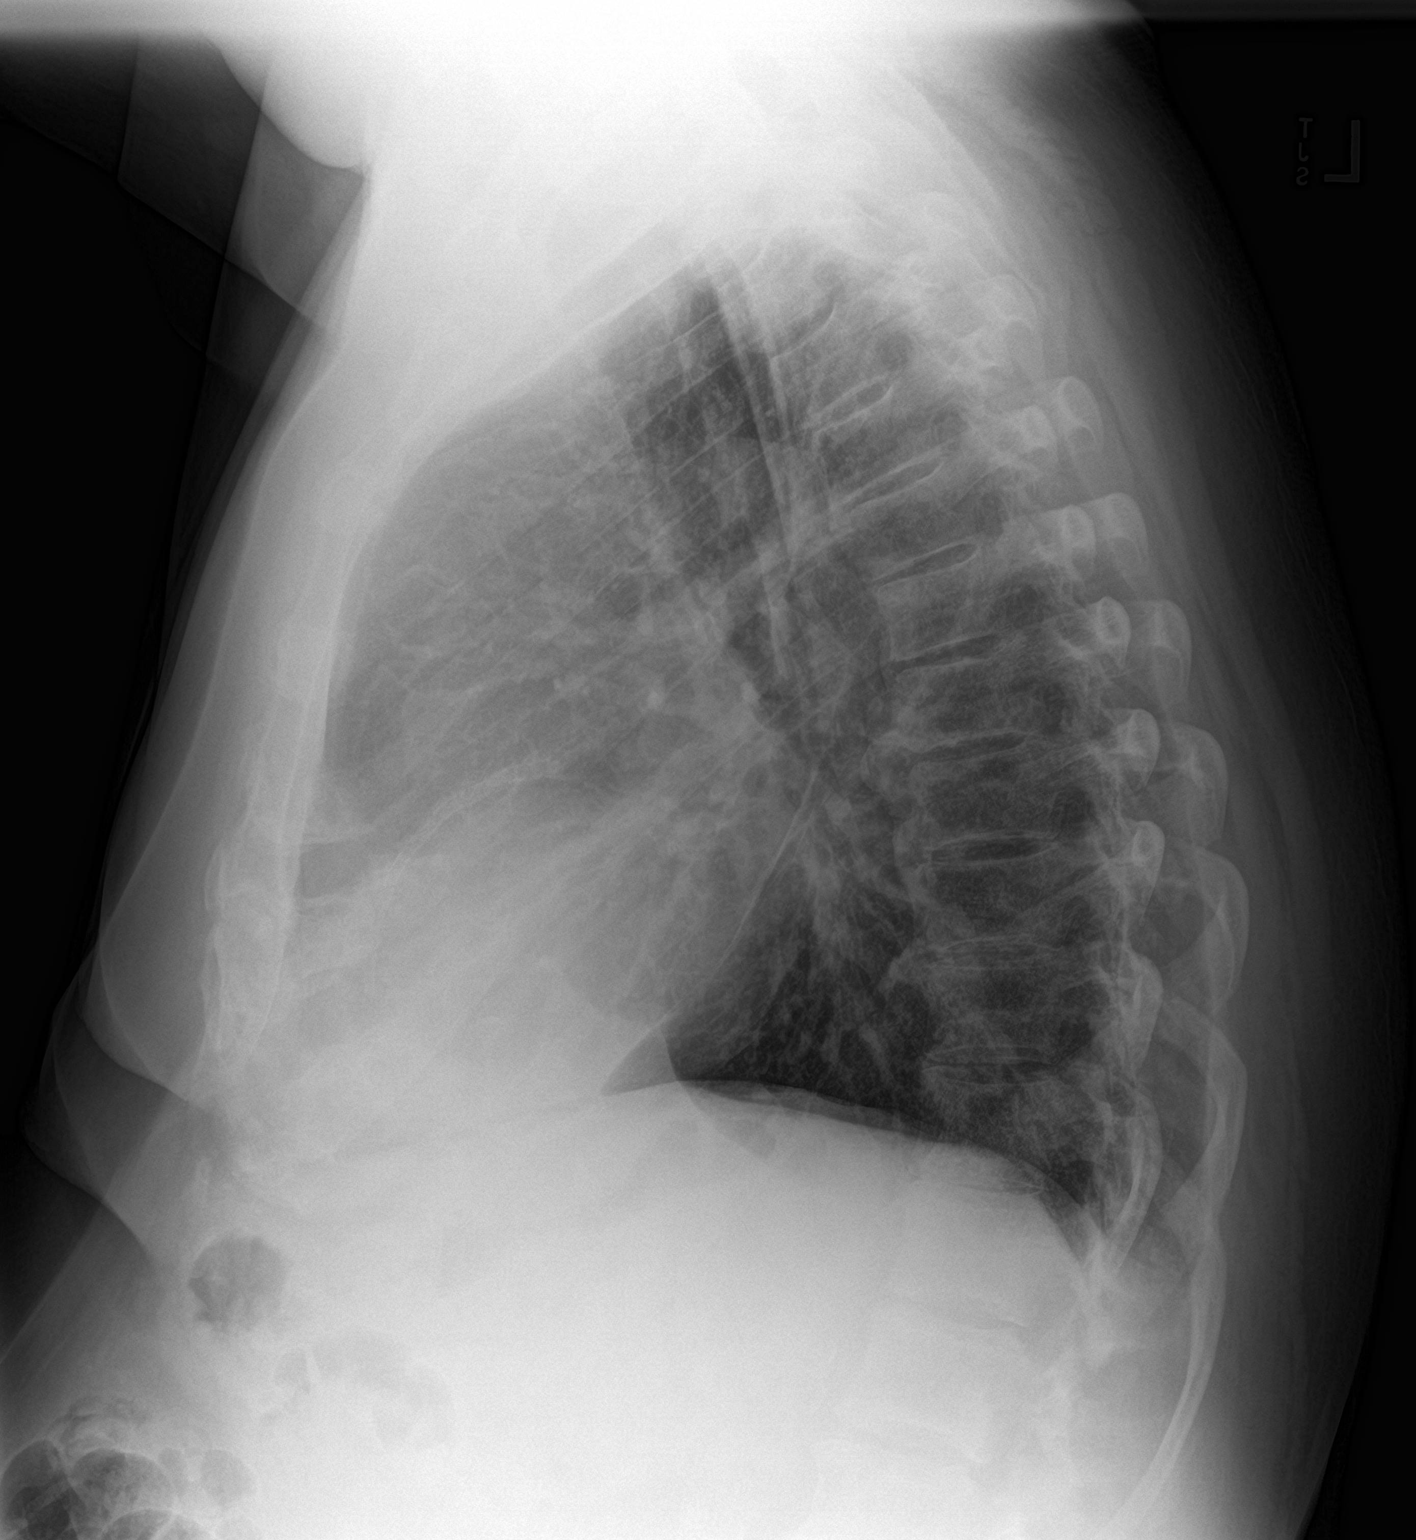

[2 of 2 positions shown; findings below may reference images not displayed]

FINDINGS: Cardiomegaly again noted. Coronary calcification noted. No pulmonary
venous congestion. No focal infiltrate. No pleural effusion or
pneumothorax. No evidence of TB. Degenerative changes thoracic spine
and both shoulders.
IMPRESSION: 1. Cardiomegaly again noted. Coronary calcification noted. No
pulmonary venous congestion.

2.  No acute pulmonary disease.  No evidence of TB.
